# Patient Record
Sex: Female | Born: 1955 | Race: White | Hispanic: No | Marital: Married | Smoking: Never smoker
Health system: Southern US, Community
[De-identification: ages and names within clinical notes are randomized; demographics above are authoritative.]

## PROBLEM LIST (undated history)

## (undated) DIAGNOSIS — R251 Tremor, unspecified: Secondary | ICD-10-CM

## (undated) DIAGNOSIS — H5210 Myopia, unspecified eye: Secondary | ICD-10-CM

## (undated) DIAGNOSIS — G40909 Epilepsy, unspecified, not intractable, without status epilepticus: Secondary | ICD-10-CM

## (undated) DIAGNOSIS — E079 Disorder of thyroid, unspecified: Secondary | ICD-10-CM

## (undated) DIAGNOSIS — H524 Presbyopia: Secondary | ICD-10-CM

## (undated) DIAGNOSIS — R569 Unspecified convulsions: Secondary | ICD-10-CM

## (undated) HISTORY — DX: Disorder of thyroid, unspecified: E07.9

## (undated) HISTORY — PX: WISDOM TOOTH EXTRACTION: SHX21

## (undated) HISTORY — DX: Unspecified convulsions: R56.9

## (undated) HISTORY — DX: Myopia, unspecified eye: H52.10

## (undated) HISTORY — DX: Tremor, unspecified: R25.1

## (undated) HISTORY — DX: Epilepsy, unspecified, not intractable, without status epilepticus: G40.909

## (undated) HISTORY — PX: TONSILLECTOMY: SUR1361

## (undated) HISTORY — DX: Presbyopia: H52.4

---

## 1983-06-18 HISTORY — PX: LOBECTOMY: SHX5089

## 2007-09-12 ENCOUNTER — Emergency Department (HOSPITAL_BASED_OUTPATIENT_CLINIC_OR_DEPARTMENT_OTHER): Admission: EM | Admit: 2007-09-12 | Discharge: 2007-09-12 | Payer: Self-pay | Admitting: Emergency Medicine

## 2007-09-13 ENCOUNTER — Ambulatory Visit: Payer: Self-pay | Admitting: Vascular Surgery

## 2007-09-13 ENCOUNTER — Encounter (INDEPENDENT_AMBULATORY_CARE_PROVIDER_SITE_OTHER): Payer: Self-pay | Admitting: Emergency Medicine

## 2007-09-13 ENCOUNTER — Ambulatory Visit (HOSPITAL_COMMUNITY): Admission: RE | Admit: 2007-09-13 | Discharge: 2007-09-13 | Payer: Self-pay | Admitting: Emergency Medicine

## 2007-10-23 ENCOUNTER — Emergency Department (HOSPITAL_BASED_OUTPATIENT_CLINIC_OR_DEPARTMENT_OTHER): Admission: EM | Admit: 2007-10-23 | Discharge: 2007-10-23 | Payer: Self-pay | Admitting: Emergency Medicine

## 2011-06-27 ENCOUNTER — Ambulatory Visit (INDEPENDENT_AMBULATORY_CARE_PROVIDER_SITE_OTHER): Payer: BC Managed Care – HMO | Admitting: Obstetrics and Gynecology

## 2011-06-27 ENCOUNTER — Encounter: Payer: Self-pay | Admitting: Obstetrics and Gynecology

## 2011-06-27 VITALS — BP 104/70 | Temp 98.2°F | Ht 61.0 in | Wt 138.0 lb

## 2011-06-27 DIAGNOSIS — R251 Tremor, unspecified: Secondary | ICD-10-CM | POA: Insufficient documentation

## 2011-06-27 DIAGNOSIS — N951 Menopausal and female climacteric states: Secondary | ICD-10-CM

## 2011-06-27 DIAGNOSIS — R569 Unspecified convulsions: Secondary | ICD-10-CM | POA: Insufficient documentation

## 2011-06-27 DIAGNOSIS — E079 Disorder of thyroid, unspecified: Secondary | ICD-10-CM | POA: Insufficient documentation

## 2011-06-27 DIAGNOSIS — H5210 Myopia, unspecified eye: Secondary | ICD-10-CM | POA: Insufficient documentation

## 2011-06-27 NOTE — Progress Notes (Signed)
Irreg Periods: no Mood Swings: yes Hot Flashes: yes Vaginal Dryness: no Poor Sleeping: yes Urinary Urgency: no UTI Symptoms: no HRT: no   WAS ON BIEST 0.625 MG Fam Hx Osteo: no Prior Bone Scan: Yes 2 YRS.AGP  NL Osteoporosis: No Hx of Dvt: No

## 2011-06-27 NOTE — Progress Notes (Signed)
Regular Periods: nopm Mammogram: yes  2011  Monthly Breast Ex.: yes Exercise: yes  Tetanus < 10 years: yes Seatbelts: yes  NI. Bladder Functn.: yes Abuse at home: no  Daily BM's: yes Stressful Work: no  Healthy Diet: yes Sigmoid-Colonoscopy: no  Calcium: yes Medical problems this year: discuss hormones   LAST PAP:2011   Contraception:PM   Mammogram:  2011  PCP: NO  PMH: NO CHANGE  FMH: NO CHANGE  Last Bone Scan: 2 YEARS AGO  NL

## 2011-06-27 NOTE — Progress Notes (Signed)
56 YO postmenopausal female (menses stopped March 2012) previously on BHRT complains of insomnia, vasomotor symptoms and mood swings.Has a history of Hashimoto's Thyroiditis.  Stopped estradiol and oral progesterone July 2012 due to weight gain.  Exercises 4 times a week (swimming and high intensity cardio) and eats healthy and regularly.  Previously on Biest (70:30) 0.625 mg and  progesterone 100-200 mg  took these for about 5  months. Patient's primary concern is weight gain.  A: Menopausal Symptoms     Weight Gain     H/O Hashimoto's Thyroiditis  P: Reviewed blood vs saliva testing along with BHRT and     conventional hormone therapy stressing that one has not      been proven superior,  more effective or safer than the       other.      Patient to perform saliva testing prior to the prescription      of another hormone therapy regimen: estradiol,       progesterone, cortisol and DHEAS       Reviewed options for managing menopausal symptoms:     herbal, hormonal and miscellaneous      Thyroid Panel and testosterone-pending      RTO-TBA   Kimiyo Carmicheal, PA-C

## 2011-06-28 LAB — TESTOSTERONE, FREE, TOTAL, SHBG: Testosterone-% Free: 0.8 % (ref 0.4–2.4)

## 2011-06-28 LAB — THYROID PANEL WITH TSH
Free Thyroxine Index: 3.1 (ref 1.0–3.9)
T4, Total: 9.5 ug/dL (ref 5.0–12.5)
TSH: 1.23 u[IU]/mL (ref 0.350–4.500)

## 2011-07-15 ENCOUNTER — Telehealth: Payer: Self-pay | Admitting: Obstetrics and Gynecology

## 2011-07-15 NOTE — Telephone Encounter (Signed)
Call to patient to review hormone lab results and thyroid panel results.  Thyroid Panel is normal. Saliva hormone test showed low normal estradiol, low normal progesterone but an imbalance in the ratio between the two making her estrogen dominant (needs more progesterone).  Her DHEAS and Cortisol are within normal range.  Patient will be given for consideration topical progesterone cream only or a combination of estradiol with progesterone cream.   Left message with patient that I would be out of town and we could follow up upon my return.  Ernestina Joe, PA-C

## 2011-07-29 ENCOUNTER — Telehealth: Payer: Self-pay | Admitting: Obstetrics and Gynecology

## 2011-07-29 NOTE — Telephone Encounter (Signed)
Call to review patient's lab results.  Left message stating the same.  Roran Wegner, PA-C

## 2011-08-08 ENCOUNTER — Telehealth: Payer: Self-pay | Admitting: Obstetrics and Gynecology

## 2011-08-08 NOTE — Telephone Encounter (Signed)
Linda/ep pt. °

## 2011-08-08 NOTE — Telephone Encounter (Signed)
Pt called wanting test results.Please call pt with saliva test results.Jamie Carr

## 2011-08-08 NOTE — Telephone Encounter (Signed)
Jamie Carr

## 2011-08-09 ENCOUNTER — Telehealth: Payer: Self-pay | Admitting: Obstetrics and Gynecology

## 2011-08-09 NOTE — Telephone Encounter (Signed)
Call to patient, left recorded message that return call had been made. Messi Twedt, PA-C

## 2011-08-09 NOTE — Telephone Encounter (Signed)
Return call to patient, left message stating the same and that a copy of her labs will be sent to her.  Tyrrell Stephens, PA-C

## 2011-08-09 NOTE — Telephone Encounter (Signed)
Call to patient to follow up the results of labs.  Left a message that return call had been made.   Alejandrina Raimer, PA-C

## 2011-08-17 ENCOUNTER — Telehealth: Payer: Self-pay | Admitting: Obstetrics and Gynecology

## 2011-08-17 NOTE — Telephone Encounter (Signed)
Call to patient to review her hormone test results.  Estradiol is low normal, Progesterone is normal but ratio between the two is low (estrogen dominant).  Patient's adrenal glands also are showing signs of strain based on cortisol and DHEAS results though they are in low normal range.  Patient given the option of Progesterone Cream 15 mg daily or a combination low dose estradiol with the progesterone or nothing at all.  Patient chooses the topical progesterone cream (15 mg) and will start with the OTC Endocreme 1 mL daily (rotating sites).  She also had questions about "spot" weight loss.  Gave information on "cool sculpting"  patient to explore.  She will also consider reading the book "Adrenal Fatigue" to explore insight on dietary ways to support adrenals.  Urged self-care and renewal activities for adrenal support.  Jamie Fischbach, PA-C

## 2011-11-03 ENCOUNTER — Encounter: Payer: Self-pay | Admitting: Obstetrics and Gynecology

## 2011-11-03 ENCOUNTER — Ambulatory Visit (INDEPENDENT_AMBULATORY_CARE_PROVIDER_SITE_OTHER): Payer: BC Managed Care – HMO | Admitting: Obstetrics and Gynecology

## 2011-11-03 VITALS — BP 110/70 | Resp 16 | Ht 62.0 in | Wt 139.0 lb

## 2011-11-03 DIAGNOSIS — G252 Other specified forms of tremor: Secondary | ICD-10-CM

## 2011-11-03 DIAGNOSIS — G250 Essential tremor: Secondary | ICD-10-CM

## 2011-11-03 NOTE — Progress Notes (Signed)
56 YO with history of essential tremors since early 20's. Patient has been using Iodine 150 mcg daily (3 gtts),  and Tyrosine 50 mg bid with some noticeable

## 2013-09-04 ENCOUNTER — Emergency Department (HOSPITAL_BASED_OUTPATIENT_CLINIC_OR_DEPARTMENT_OTHER): Payer: BC Managed Care – PPO

## 2013-09-04 ENCOUNTER — Encounter (HOSPITAL_BASED_OUTPATIENT_CLINIC_OR_DEPARTMENT_OTHER): Payer: Self-pay | Admitting: Emergency Medicine

## 2013-09-04 ENCOUNTER — Emergency Department (HOSPITAL_BASED_OUTPATIENT_CLINIC_OR_DEPARTMENT_OTHER)
Admission: EM | Admit: 2013-09-04 | Discharge: 2013-09-04 | Disposition: A | Discharge status: Home / Self Care | Payer: BC Managed Care – PPO | Attending: Emergency Medicine | Admitting: Emergency Medicine

## 2013-09-04 DIAGNOSIS — Z8669 Personal history of other diseases of the nervous system and sense organs: Secondary | ICD-10-CM | POA: Diagnosis not present

## 2013-09-04 DIAGNOSIS — R220 Localized swelling, mass and lump, head: Secondary | ICD-10-CM | POA: Insufficient documentation

## 2013-09-04 DIAGNOSIS — Z79899 Other long term (current) drug therapy: Secondary | ICD-10-CM | POA: Diagnosis not present

## 2013-09-04 DIAGNOSIS — K118 Other diseases of salivary glands: Secondary | ICD-10-CM | POA: Insufficient documentation

## 2013-09-04 DIAGNOSIS — Z8639 Personal history of other endocrine, nutritional and metabolic disease: Secondary | ICD-10-CM | POA: Insufficient documentation

## 2013-09-04 DIAGNOSIS — R609 Edema, unspecified: Secondary | ICD-10-CM

## 2013-09-04 DIAGNOSIS — Z862 Personal history of diseases of the blood and blood-forming organs and certain disorders involving the immune mechanism: Secondary | ICD-10-CM | POA: Insufficient documentation

## 2013-09-04 DIAGNOSIS — R221 Localized swelling, mass and lump, neck: Secondary | ICD-10-CM

## 2013-09-04 LAB — CBC
HEMATOCRIT: 38.5 % (ref 36.0–46.0)
HEMOGLOBIN: 12.7 g/dL (ref 12.0–15.0)
MCH: 30.8 pg (ref 26.0–34.0)
MCHC: 33 g/dL (ref 30.0–36.0)
MCV: 93.2 fL (ref 78.0–100.0)
Platelets: 244 10*3/uL (ref 150–400)
RBC: 4.13 MIL/uL (ref 3.87–5.11)
RDW: 12.2 % (ref 11.5–15.5)
WBC: 9.5 10*3/uL (ref 4.0–10.5)

## 2013-09-04 LAB — BASIC METABOLIC PANEL
Anion gap: 17 — ABNORMAL HIGH (ref 5–15)
BUN: 15 mg/dL (ref 6–23)
CHLORIDE: 100 meq/L (ref 96–112)
CO2: 24 meq/L (ref 19–32)
Calcium: 10 mg/dL (ref 8.4–10.5)
Creatinine, Ser: 1.1 mg/dL (ref 0.50–1.10)
GFR calc Af Amer: 64 mL/min — ABNORMAL LOW (ref >90)
GFR calc non Af Amer: 55 mL/min — ABNORMAL LOW (ref >90)
GLUCOSE: 86 mg/dL (ref 70–99)
POTASSIUM: 3.8 meq/L (ref 3.7–5.3)
Sodium: 141 mEq/L (ref 137–147)

## 2013-09-04 MED ORDER — IOHEXOL 300 MG/ML  SOLN
75.0000 mL | Freq: Once | INTRAMUSCULAR | Status: AC | PRN
  Administered 2013-09-04: 75 mL via INTRAVENOUS

## 2013-09-04 MED ORDER — HYDROCODONE-ACETAMINOPHEN 5-325 MG PO TABS: 1.0000 | ORAL_TABLET | Freq: Four times a day (QID) | ORAL | Status: DC | PRN

## 2013-09-04 MED ORDER — ACETAMINOPHEN 325 MG PO TABS
650.0000 mg | ORAL_TABLET | Freq: Once | ORAL | Status: AC
  Administered 2013-09-04: 650 mg via ORAL
  Filled 2013-09-04: qty 2

## 2013-09-04 MED ORDER — CLINDAMYCIN HCL 300 MG PO CAPS: 300.0000 mg | ORAL_CAPSULE | Freq: Three times a day (TID) | ORAL | Status: DC

## 2013-09-04 MED ORDER — CLINDAMYCIN PHOSPHATE 600 MG/50ML IV SOLN
600.0000 mg | Freq: Once | INTRAVENOUS | Status: AC
  Administered 2013-09-04: 600 mg via INTRAVENOUS
  Filled 2013-09-04: qty 50

## 2013-09-04 MED ORDER — IBUPROFEN 800 MG PO TABS: 800.0000 mg | ORAL_TABLET | Freq: Three times a day (TID) | ORAL | Status: DC

## 2013-09-04 NOTE — ED Provider Notes (Signed)
CSN: [id number]     Arrival date & time 09/04/13  1232 History   First MD Initiated Contact with Patient 09/04/13 1244     No chief complaint on file.    (Consider location/radiation/quality/duration/timing/severity/associated sxs/prior Treatment) HPI Comments: Pt states that she woke up with jaw pain a couple of days ago and now she has swelling and pain to her left lower jaw. Denies fever. No history of similar symptoms. Denies airway problems or fever:states that she has seen a dentist in the last couple of months  The history is provided by the patient. No language interpreter was used.    Past Medical History  Diagnosis Date  . Tremors of nervous system     essential  . Thyroid disease   . Seizures   . Myopia   . Presbyopia   . Epilepsy    Past Surgical History  Procedure Laterality Date  . Cesarean section  1/93,1/95  . Tonsillectomy    . Wisdom tooth extraction    . Lobectomy  06/85    right temporal   Family History  Problem Relation Age of Onset  . Diabetes Paternal Grandmother   . Nephrolithiasis Father   . Migraines Mother   . Hypertension Mother   . Deep vein thrombosis Mother   . Stroke Mother   . Nephrolithiasis Mother    History  Substance Use Topics  . Smoking status: Never Smoker   . Smokeless tobacco: Never Used  . Alcohol Use: No   OB History   Grav Para Term Preterm Abortions TAB SAB Ect Mult Living   3 2   1  1   2      Review of Systems  Constitutional: Negative.   Respiratory: Negative.   Cardiovascular: Negative.       Allergies  Eggs or egg-derived products; Sulfa antibiotics; and Wheat bran  Home Medications   Prior to Admission medications   Medication Sig Start Date End Date Taking? Authorizing Provider  Ascorbic Acid (VITAMIN C) 100 MG tablet Take 100 mg by mouth daily.    Historical Provider, MD  cholecalciferol (VITAMIN D) 1000 UNITS tablet Take 10,000 Units by mouth daily.    Historical Provider, MD  co-enzyme Q-10 30  MG capsule Take 30 mg by mouth 3 (three) times daily.    Historical Provider, MD  DIGESTIVE ENZYMES PO Take by mouth.    Historical Provider, MD  L-TYROSINE PO Take 50 mg by mouth.    Historical Provider, MD  Multiple Minerals-Vitamins (CALCIUM & VIT D3 BONE HEALTH PO) Take by mouth.    Historical Provider, MD  thiamine (VITAMIN B-1) 100 MG tablet Take 100 mg by mouth daily.    Historical Provider, MD   BP 168/82  Pulse 82  Resp 16  Ht 5\' 2"  (1.575 m)  Wt 130 lb (58.968 kg)  BMI 23.77 kg/m2  SpO2 100% Physical Exam  Nursing note and vitals reviewed. Constitutional: She is oriented to person, place, and time. She appears well-developed and well-nourished.  HENT:  Right Ear: External ear normal.  Left Ear: External ear normal.  Obvious swelling to the left lower jaw that goes under the chin  Cardiovascular: Normal rate and regular rhythm.   Pulmonary/Chest: Effort normal.  Neurological: She is alert and oriented to person, place, and time.    ED Course  Procedures (including critical care time) Labs Review Labs Reviewed  BASIC METABOLIC PANEL - Abnormal; Notable for the following:    GFR calc non Af [place] Dago[place]  55 (*)    GFR calc Af Amer 64 (*)    Anion gap 17 (*)    All other components within normal limits  CBC    Imaging Review Ct Maxillofacial W/cm  09/04/2013   ADDENDUM REPORT: 09/04/2013 14:16  CLINICAL DATA:  Left facial swelling  EXAM: CT MAXILLOFACIAL WITH CONTRAST  TECHNIQUE: Multidetector CT imaging of the maxillofacial structures was performed with intravenous contrast. Multiplanar CT image reconstructions were also generated. A small metallic BB was placed on the right temple in order to reliably differentiate right from left.  CONTRAST:  100 mL Omnipaque 300 IV  COMPARISON:  None.  FINDINGS: Soft tissue edema is present lateral and inferior to the mandible on the left side. Mild thickening of the platysmas muscle with subcutaneous edema. Enlargement with hyper enhancement  of the left sublingual gland which measures approximately 15 x 20 mm. Soft tissue edema is also located posterior to the left sublingual gland. Small hyper enhancing lymph nodes are present inferior to the mandible on the left measuring 6 mm and 8 mm.  Negative for salivary duct stone.  Submandibular gland is normal bilaterally. No bony change. No dental abscess identified. No soft tissue abscess identified.  The pharynx is normal and symmetric.  The orbit is normal.  Paranasal sinuses are clear.  IMPRESSION: Hyper enhancing enlarged left sublingual gland with adjacent hyper enhancing lymph nodes. There is also adjacent subcutaneous edema. Findings are most likely due to infection however sublingual tumor not excluded. Close followup is recommended after antibiotic to ensure clearing.   Electronically Signed   By: Marlan Palau M.D.   On: 09/04/2013 14:16   09/04/2013   EXAM: CT MAXILLOFACIAL WITH CONTRAST  TECHNIQUE: Multidetector CT imaging of the maxillofacial structures was performed with intravenous contrast. Multiplanar CT image reconstructions were also generated. A small metallic BB was placed on the right temple in order to reliably differentiate right from left.  CONTRAST:  75mL OMNIPAQUE IOHEXOL 300 MG/ML  SOLN  COMPARISON:  None.  Electronically Signed: By: Marlan Palau M.D. On: 09/04/2013 13:32     EKG Interpretation None      MDM   Final diagnoses:  Sublingual gland swelling    Discussed findings and follow up with pt. Pt is okay with follow up for continued symptoms. No airway involvement    Teressa Lower, NP 09/04/13 [phone]

## 2013-09-04 NOTE — ED Notes (Signed)
Pt c/o bil jaw , left ear pain, and teeth pain x 3 days

## 2013-09-04 NOTE — Discharge Instructions (Signed)
As discussed if symptom worsen or don't get any better then you need to follow up with your doctor Dental Care and Dentist Visits Dental care supports good overall health. Regular dental visits can also help you avoid dental pain, bleeding, infection, and other more serious health problems in the future. It is important to keep the mouth healthy because diseases in the teeth, gums, and other oral tissues can spread to other areas of the body. Some problems, such as diabetes, heart disease, and pre-term labor have been associated with poor oral health.  See your dentist every 6 months. If you experience emergency problems such as a toothache or broken tooth, go to the dentist right away. If you see your dentist regularly, you may catch problems early. It is easier to be treated for problems in the early stages.  WHAT TO EXPECT AT A DENTIST VISIT  Your dentist will look for many common oral health problems and recommend proper treatment. At your regular dental visit, you can expect:  Gentle cleaning of the teeth and gums. This includes scraping and polishing. This helps to remove the sticky substance around the teeth and gums (plaque). Plaque forms in the mouth shortly after eating. Over time, plaque hardens on the teeth as tartar. If tartar is not removed regularly, it can cause problems. Cleaning also helps remove stains.  Periodic X-rays. These pictures of the teeth and supporting bone will help your dentist assess the health of your teeth.  Periodic fluoride treatments. Fluoride is a natural mineral shown to help strengthen teeth. Fluoride treatmentinvolves applying a fluoride gel or varnish to the teeth. It is most commonly done in children.  Examination of the mouth, tongue, jaws, teeth, and gums to look for any oral health problems, such as:  Cavities (dental caries). This is decay on the tooth caused by plaque, sugar, and acid in the mouth. It is best to catch a cavity when it is  small.  Inflammation of the gums caused by plaque buildup (gingivitis).  Problems with the mouth or malformed or misaligned teeth.  Oral cancer or other diseases of the soft tissues or jaws. KEEP YOUR TEETH AND GUMS HEALTHY For healthy teeth and gums, follow these general guidelines as well as your dentist's specific advice:  Have your teeth professionally cleaned at the dentist every 6 months.  Brush twice daily with a fluoride toothpaste.  Floss your teeth daily.  Ask your dentist if you need fluoride supplements, treatments, or fluoride toothpaste.  Eat a healthy diet. Reduce foods and drinks with added sugar.  Avoid smoking. TREATMENT FOR ORAL HEALTH PROBLEMS If you have oral health problems, treatment varies depending on the conditions present in your teeth and gums.  Your caregiver will most likely recommend good oral hygiene at each visit.  For cavities, gingivitis, or other oral health disease, your caregiver will perform a procedure to treat the problem. This is typically done at a separate appointment. Sometimes your caregiver will refer you to another dental specialist for specific tooth problems or for surgery. SEEK IMMEDIATE DENTAL CARE IF:  You have pain, bleeding, or soreness in the gum, tooth, jaw, or mouth area.  A permanent tooth becomes loose or separated from the gum socket.  You experience a blow or injury to the mouth or jaw area. Document Released: 09/15/2010 Document Revised: 03/28/2011 Document Reviewed: 09/15/2010 [facility]ExitCare Patient Information 2015 [place] [place]. This information is not intended to replace advice given to you by your health care provider. Make sure you  discuss any questions you have with your health care provider. ° °

## 2013-09-08 NOTE — ED Provider Notes (Signed)
Medical screening examination/treatment/procedure(s) were performed by non-physician practitioner and as supervising physician I was immediately available for consultation/collaboration.   Meagen Limones L Rhian Asebedo, MD 09/08/13 1122 

## 2013-11-18 ENCOUNTER — Encounter (HOSPITAL_BASED_OUTPATIENT_CLINIC_OR_DEPARTMENT_OTHER): Payer: Self-pay | Admitting: Emergency Medicine

## 2018-12-20 DIAGNOSIS — Z0001 Encounter for general adult medical examination with abnormal findings: Secondary | ICD-10-CM | POA: Diagnosis not present

## 2018-12-20 DIAGNOSIS — N951 Menopausal and female climacteric states: Secondary | ICD-10-CM | POA: Diagnosis not present

## 2018-12-20 DIAGNOSIS — Z13220 Encounter for screening for lipoid disorders: Secondary | ICD-10-CM | POA: Diagnosis not present

## 2018-12-20 DIAGNOSIS — R251 Tremor, unspecified: Secondary | ICD-10-CM | POA: Diagnosis not present

## 2018-12-20 DIAGNOSIS — E063 Autoimmune thyroiditis: Secondary | ICD-10-CM | POA: Diagnosis not present

## 2018-12-20 DIAGNOSIS — E039 Hypothyroidism, unspecified: Secondary | ICD-10-CM | POA: Diagnosis not present

## 2018-12-20 DIAGNOSIS — E559 Vitamin D deficiency, unspecified: Secondary | ICD-10-CM | POA: Diagnosis not present

## 2018-12-20 DIAGNOSIS — E538 Deficiency of other specified B group vitamins: Secondary | ICD-10-CM | POA: Diagnosis not present

## 2018-12-20 DIAGNOSIS — K9041 Non-celiac gluten sensitivity: Secondary | ICD-10-CM | POA: Diagnosis not present

## 2019-07-16 DIAGNOSIS — G629 Polyneuropathy, unspecified: Secondary | ICD-10-CM | POA: Diagnosis not present

## 2020-03-12 DIAGNOSIS — E559 Vitamin D deficiency, unspecified: Secondary | ICD-10-CM | POA: Diagnosis not present

## 2020-03-12 DIAGNOSIS — Z789 Other specified health status: Secondary | ICD-10-CM | POA: Diagnosis not present

## 2020-03-12 DIAGNOSIS — N951 Menopausal and female climacteric states: Secondary | ICD-10-CM | POA: Diagnosis not present

## 2020-03-12 DIAGNOSIS — E063 Autoimmune thyroiditis: Secondary | ICD-10-CM | POA: Diagnosis not present

## 2020-03-12 DIAGNOSIS — Z0001 Encounter for general adult medical examination with abnormal findings: Secondary | ICD-10-CM | POA: Diagnosis not present

## 2020-03-12 DIAGNOSIS — E039 Hypothyroidism, unspecified: Secondary | ICD-10-CM | POA: Diagnosis not present

## 2020-03-12 DIAGNOSIS — E538 Deficiency of other specified B group vitamins: Secondary | ICD-10-CM | POA: Diagnosis not present

## 2020-06-30 ENCOUNTER — Inpatient Hospital Stay (HOSPITAL_BASED_OUTPATIENT_CLINIC_OR_DEPARTMENT_OTHER)
Admission: EM | Admit: 2020-06-30 | Discharge: 2020-07-15 | DRG: 177 | Disposition: A | Discharge status: Home Health | Payer: BC Managed Care – PPO | Attending: Internal Medicine | Admitting: Internal Medicine

## 2020-06-30 ENCOUNTER — Emergency Department (HOSPITAL_BASED_OUTPATIENT_CLINIC_OR_DEPARTMENT_OTHER): Payer: BC Managed Care – PPO

## 2020-06-30 ENCOUNTER — Encounter (HOSPITAL_BASED_OUTPATIENT_CLINIC_OR_DEPARTMENT_OTHER): Payer: Self-pay | Admitting: *Deleted

## 2020-06-30 ENCOUNTER — Other Ambulatory Visit: Payer: Self-pay

## 2020-06-30 DIAGNOSIS — E440 Moderate protein-calorie malnutrition: Secondary | ICD-10-CM | POA: Diagnosis not present

## 2020-06-30 DIAGNOSIS — A491 Streptococcal infection, unspecified site: Secondary | ICD-10-CM | POA: Diagnosis not present

## 2020-06-30 DIAGNOSIS — J80 Acute respiratory distress syndrome: Secondary | ICD-10-CM | POA: Diagnosis not present

## 2020-06-30 DIAGNOSIS — R091 Pleurisy: Secondary | ICD-10-CM | POA: Diagnosis not present

## 2020-06-30 DIAGNOSIS — Z8249 Family history of ischemic heart disease and other diseases of the circulatory system: Secondary | ICD-10-CM | POA: Diagnosis not present

## 2020-06-30 DIAGNOSIS — R54 Age-related physical debility: Secondary | ICD-10-CM | POA: Diagnosis not present

## 2020-06-30 DIAGNOSIS — E876 Hypokalemia: Secondary | ICD-10-CM

## 2020-06-30 DIAGNOSIS — R945 Abnormal results of liver function studies: Secondary | ICD-10-CM | POA: Diagnosis not present

## 2020-06-30 DIAGNOSIS — J9690 Respiratory failure, unspecified, unspecified whether with hypoxia or hypercapnia: Secondary | ICD-10-CM

## 2020-06-30 DIAGNOSIS — R7401 Elevation of levels of liver transaminase levels: Secondary | ICD-10-CM | POA: Diagnosis present

## 2020-06-30 DIAGNOSIS — Z6822 Body mass index (BMI) 22.0-22.9, adult: Secondary | ICD-10-CM

## 2020-06-30 DIAGNOSIS — Z882 Allergy status to sulfonamides status: Secondary | ICD-10-CM | POA: Diagnosis not present

## 2020-06-30 DIAGNOSIS — Z20822 Contact with and (suspected) exposure to covid-19: Secondary | ICD-10-CM | POA: Diagnosis not present

## 2020-06-30 DIAGNOSIS — R0602 Shortness of breath: Secondary | ICD-10-CM

## 2020-06-30 DIAGNOSIS — I700 Atherosclerosis of aorta: Secondary | ICD-10-CM | POA: Diagnosis not present

## 2020-06-30 DIAGNOSIS — Z833 Family history of diabetes mellitus: Secondary | ICD-10-CM | POA: Diagnosis not present

## 2020-06-30 DIAGNOSIS — B954 Other streptococcus as the cause of diseases classified elsewhere: Secondary | ICD-10-CM | POA: Diagnosis present

## 2020-06-30 DIAGNOSIS — J811 Chronic pulmonary edema: Secondary | ICD-10-CM | POA: Diagnosis present

## 2020-06-30 DIAGNOSIS — Z91012 Allergy to eggs: Secondary | ICD-10-CM

## 2020-06-30 DIAGNOSIS — J852 Abscess of lung without pneumonia: Secondary | ICD-10-CM | POA: Diagnosis not present

## 2020-06-30 DIAGNOSIS — J929 Pleural plaque without asbestos: Secondary | ICD-10-CM | POA: Diagnosis not present

## 2020-06-30 DIAGNOSIS — J9601 Acute respiratory failure with hypoxia: Secondary | ICD-10-CM | POA: Diagnosis not present

## 2020-06-30 DIAGNOSIS — Z4682 Encounter for fitting and adjustment of non-vascular catheter: Secondary | ICD-10-CM | POA: Diagnosis not present

## 2020-06-30 DIAGNOSIS — J982 Interstitial emphysema: Secondary | ICD-10-CM | POA: Diagnosis not present

## 2020-06-30 DIAGNOSIS — R918 Other nonspecific abnormal finding of lung field: Secondary | ICD-10-CM | POA: Diagnosis present

## 2020-06-30 DIAGNOSIS — J189 Pneumonia, unspecified organism: Secondary | ICD-10-CM | POA: Diagnosis present

## 2020-06-30 DIAGNOSIS — J948 Other specified pleural conditions: Secondary | ICD-10-CM | POA: Diagnosis not present

## 2020-06-30 DIAGNOSIS — R7989 Other specified abnormal findings of blood chemistry: Secondary | ICD-10-CM

## 2020-06-30 DIAGNOSIS — G40909 Epilepsy, unspecified, not intractable, without status epilepticus: Secondary | ICD-10-CM | POA: Diagnosis present

## 2020-06-30 DIAGNOSIS — I959 Hypotension, unspecified: Secondary | ICD-10-CM | POA: Diagnosis present

## 2020-06-30 DIAGNOSIS — J939 Pneumothorax, unspecified: Secondary | ICD-10-CM | POA: Diagnosis not present

## 2020-06-30 DIAGNOSIS — E872 Acidosis: Secondary | ICD-10-CM | POA: Diagnosis present

## 2020-06-30 DIAGNOSIS — J90 Pleural effusion, not elsewhere classified: Secondary | ICD-10-CM | POA: Diagnosis not present

## 2020-06-30 DIAGNOSIS — D75839 Thrombocytosis, unspecified: Secondary | ICD-10-CM | POA: Diagnosis present

## 2020-06-30 DIAGNOSIS — Z823 Family history of stroke: Secondary | ICD-10-CM | POA: Diagnosis not present

## 2020-06-30 DIAGNOSIS — J942 Hemothorax: Secondary | ICD-10-CM | POA: Diagnosis not present

## 2020-06-30 DIAGNOSIS — J869 Pyothorax without fistula: Secondary | ICD-10-CM | POA: Diagnosis not present

## 2020-06-30 DIAGNOSIS — J9811 Atelectasis: Secondary | ICD-10-CM | POA: Diagnosis not present

## 2020-06-30 DIAGNOSIS — Z79899 Other long term (current) drug therapy: Secondary | ICD-10-CM

## 2020-06-30 DIAGNOSIS — R0609 Other forms of dyspnea: Secondary | ICD-10-CM | POA: Diagnosis not present

## 2020-06-30 DIAGNOSIS — E871 Hypo-osmolality and hyponatremia: Secondary | ICD-10-CM | POA: Diagnosis not present

## 2020-06-30 DIAGNOSIS — D649 Anemia, unspecified: Secondary | ICD-10-CM

## 2020-06-30 LAB — BASIC METABOLIC PANEL
Anion gap: 13 (ref 5–15)
BUN: 36 mg/dL — ABNORMAL HIGH (ref 8–23)
CO2: 20 mmol/L — ABNORMAL LOW (ref 22–32)
Calcium: 10.7 mg/dL — ABNORMAL HIGH (ref 8.9–10.3)
Chloride: 99 mmol/L (ref 98–111)
Creatinine, Ser: 0.88 mg/dL (ref 0.44–1.00)
GFR, Estimated: >60 mL/min (ref >60)
Glucose, Bld: 118 mg/dL — ABNORMAL HIGH (ref 70–99)
Potassium: 3.7 mmol/L (ref 3.5–5.1)
Sodium: 132 mmol/L — ABNORMAL LOW (ref 135–145)

## 2020-06-30 LAB — RESP PANEL BY RT-PCR (FLU A&B, COVID) ARPGX2
Influenza A by PCR: NEGATIVE
Influenza B by PCR: NEGATIVE
SARS Coronavirus 2 by RT PCR: NEGATIVE

## 2020-06-30 LAB — CBC WITH DIFFERENTIAL/PLATELET
Abs Immature Granulocytes: 1.07 10*3/uL — ABNORMAL HIGH (ref 0.00–0.07)
Basophils Absolute: 0.2 10*3/uL — ABNORMAL HIGH (ref 0.0–0.1)
Basophils Relative: 1 %
Eosinophils Absolute: 0 10*3/uL (ref 0.0–0.5)
Eosinophils Relative: 0 %
HCT: 30.6 % — ABNORMAL LOW (ref 36.0–46.0)
Hemoglobin: 10.5 g/dL — ABNORMAL LOW (ref 12.0–15.0)
Immature Granulocytes: 4 %
Lymphocytes Relative: 5 %
Lymphs Abs: 1.5 10*3/uL (ref 0.7–4.0)
MCH: 31.3 pg (ref 26.0–34.0)
MCHC: 34.3 g/dL (ref 30.0–36.0)
MCV: 91.1 fL (ref 80.0–100.0)
Monocytes Absolute: 1.8 10*3/uL — ABNORMAL HIGH (ref 0.1–1.0)
Monocytes Relative: 6 %
Neutro Abs: 24.7 10*3/uL — ABNORMAL HIGH (ref 1.7–7.7)
Neutrophils Relative %: 84 %
Platelets: 635 10*3/uL — ABNORMAL HIGH (ref 150–400)
RBC: 3.36 MIL/uL — ABNORMAL LOW (ref 3.87–5.11)
RDW: 13.7 % (ref 11.5–15.5)
WBC: 29.3 10*3/uL — ABNORMAL HIGH (ref 4.0–10.5)
nRBC: 0 % (ref 0.0–0.2)

## 2020-06-30 LAB — CBC
HCT: 31.6 % — ABNORMAL LOW (ref 36.0–46.0)
Hemoglobin: 10.6 g/dL — ABNORMAL LOW (ref 12.0–15.0)
MCH: 31.3 pg (ref 26.0–34.0)
MCHC: 33.5 g/dL (ref 30.0–36.0)
MCV: 93.2 fL (ref 80.0–100.0)
Platelets: 620 10*3/uL — ABNORMAL HIGH (ref 150–400)
RBC: 3.39 MIL/uL — ABNORMAL LOW (ref 3.87–5.11)
RDW: 13.9 % (ref 11.5–15.5)
WBC: 23 10*3/uL — ABNORMAL HIGH (ref 4.0–10.5)
nRBC: 0 % (ref 0.0–0.2)

## 2020-06-30 LAB — PROTEIN, PLEURAL OR PERITONEAL FLUID: Total protein, fluid: <3 g/dL

## 2020-06-30 LAB — BODY FLUID CELL COUNT WITH DIFFERENTIAL
Other Cells, Fluid: UNDETERMINED %
Total Nucleated Cell Count, Fluid: 16000 cu mm — ABNORMAL HIGH (ref 0–1000)

## 2020-06-30 LAB — TROPONIN I (HIGH SENSITIVITY)
Troponin I (High Sensitivity): 9 ng/L (ref <18)
Troponin I (High Sensitivity): 11 ng/L (ref <18)

## 2020-06-30 LAB — CREATININE, SERUM
Creatinine, Ser: 0.9 mg/dL (ref 0.44–1.00)
GFR, Estimated: >60 mL/min (ref >60)

## 2020-06-30 LAB — LACTATE DEHYDROGENASE: LDH: 152 U/L (ref 98–192)

## 2020-06-30 LAB — LACTIC ACID, PLASMA
Lactic Acid, Venous: 1.2 mmol/L (ref 0.5–1.9)
Lactic Acid, Venous: 1.3 mmol/L (ref 0.5–1.9)

## 2020-06-30 LAB — MRSA PCR SCREENING: MRSA by PCR: NEGATIVE

## 2020-06-30 LAB — LACTATE DEHYDROGENASE, PLEURAL OR PERITONEAL FLUID: LD, Fluid: 534 U/L — ABNORMAL HIGH (ref 3–23)

## 2020-06-30 MED ORDER — LORATADINE 10 MG PO TABS
10.0000 mg | ORAL_TABLET | Freq: Every day | ORAL | Status: DC
  Administered 2020-06-30 – 2020-07-14 (×15): 10 mg via ORAL
  Filled 2020-06-30 (×15): qty 1

## 2020-06-30 MED ORDER — VANCOMYCIN HCL IN DEXTROSE 1-5 GM/200ML-% IV SOLN
1000.0000 mg | Freq: Once | INTRAVENOUS | Status: AC
  Administered 2020-06-30: 1000 mg via INTRAVENOUS
  Filled 2020-06-30: qty 200

## 2020-06-30 MED ORDER — ONDANSETRON HCL 4 MG/2ML IJ SOLN: 4.0000 mg | Freq: Four times a day (QID) | INTRAMUSCULAR | Status: DC | PRN

## 2020-06-30 MED ORDER — SODIUM CHLORIDE 0.9 % IV SOLN
1.0000 g | INTRAVENOUS | Status: DC
  Filled 2020-06-30: qty 10

## 2020-06-30 MED ORDER — LACTATED RINGERS IV BOLUS (SEPSIS): 500.0000 mL | Freq: Once | INTRAVENOUS | Status: DC

## 2020-06-30 MED ORDER — SODIUM CHLORIDE 0.9 % IV SOLN
INTRAVENOUS | Status: DC | PRN
  Administered 2020-06-30: 250 mL via INTRAVENOUS

## 2020-06-30 MED ORDER — SODIUM CHLORIDE 0.9% FLUSH
10.0000 mL | Freq: Three times a day (TID) | INTRAVENOUS | Status: DC
  Administered 2020-06-30 – 2020-07-01 (×3): 10 mL
  Filled 2020-06-30: qty 10

## 2020-06-30 MED ORDER — LACTATED RINGERS IV SOLN: INTRAVENOUS | Status: DC

## 2020-06-30 MED ORDER — LEVOCETIRIZINE DIHYDROCHLORIDE 5 MG PO TABS: 5.0000 mg | ORAL_TABLET | Freq: Every evening | ORAL | Status: DC

## 2020-06-30 MED ORDER — LIDOCAINE-EPINEPHRINE (PF) 2 %-1:200000 IJ SOLN
INTRAMUSCULAR | Status: AC
  Filled 2020-06-30: qty 20

## 2020-06-30 MED ORDER — IBUPROFEN 200 MG PO TABS
600.0000 mg | ORAL_TABLET | Freq: Once | ORAL | Status: AC
  Administered 2020-06-30: 600 mg via ORAL
  Filled 2020-06-30: qty 3

## 2020-06-30 MED ORDER — CHLORHEXIDINE GLUCONATE 0.12 % MT SOLN
15.0000 mL | Freq: Two times a day (BID) | OROMUCOSAL | Status: DC
  Administered 2020-06-30 – 2020-07-15 (×25): 15 mL via OROMUCOSAL
  Filled 2020-06-30 (×26): qty 15

## 2020-06-30 MED ORDER — SODIUM CHLORIDE 0.9 % IV SOLN
500.0000 mg | INTRAVENOUS | Status: DC
  Administered 2020-07-01 – 2020-07-02 (×2): 500 mg via INTRAVENOUS
  Filled 2020-06-30 (×2): qty 500

## 2020-06-30 MED ORDER — VITAMIN C 250 MG PO TABS
250.0000 mg | ORAL_TABLET | Freq: Every day | ORAL | Status: DC
  Administered 2020-06-30 – 2020-07-15 (×16): 250 mg via ORAL
  Filled 2020-06-30 (×16): qty 1

## 2020-06-30 MED ORDER — ONDANSETRON HCL 4 MG PO TABS: 4.0000 mg | ORAL_TABLET | Freq: Four times a day (QID) | ORAL | Status: DC | PRN

## 2020-06-30 MED ORDER — SODIUM CHLORIDE 0.9 % IV SOLN
500.0000 mg | Freq: Once | INTRAVENOUS | Status: AC
  Administered 2020-06-30: 500 mg via INTRAVENOUS
  Filled 2020-06-30: qty 500

## 2020-06-30 MED ORDER — ACETAMINOPHEN 325 MG PO TABS
650.0000 mg | ORAL_TABLET | Freq: Four times a day (QID) | ORAL | Status: DC | PRN
  Administered 2020-07-01 – 2020-07-15 (×21): 650 mg via ORAL
  Filled 2020-06-30 (×20): qty 2

## 2020-06-30 MED ORDER — LACTATED RINGERS IV BOLUS (SEPSIS): 250.0000 mL | Freq: Once | INTRAVENOUS | Status: DC

## 2020-06-30 MED ORDER — LACTATED RINGERS IV BOLUS (SEPSIS)
1000.0000 mL | Freq: Once | INTRAVENOUS | Status: AC
  Administered 2020-06-30: 1000 mL via INTRAVENOUS

## 2020-06-30 MED ORDER — ORAL CARE MOUTH RINSE
15.0000 mL | Freq: Two times a day (BID) | OROMUCOSAL | Status: DC
  Administered 2020-06-30 – 2020-07-15 (×17): 15 mL via OROMUCOSAL

## 2020-06-30 MED ORDER — SODIUM CHLORIDE 0.9 % IV SOLN
INTRAVENOUS | Status: DC | PRN
  Administered 2020-06-30: 250 mL via INTRAVENOUS
  Administered 2020-06-30: 10 mL via INTRAVENOUS

## 2020-06-30 MED ORDER — SODIUM CHLORIDE 0.9 % IV SOLN
2.0000 g | Freq: Once | INTRAVENOUS | Status: AC
  Administered 2020-06-30: 2 g via INTRAVENOUS
  Filled 2020-06-30: qty 20

## 2020-06-30 MED ORDER — CHLORHEXIDINE GLUCONATE CLOTH 2 % EX PADS
6.0000 | MEDICATED_PAD | Freq: Every day | CUTANEOUS | Status: DC
  Administered 2020-06-30 – 2020-07-15 (×16): 6 via TOPICAL

## 2020-06-30 MED ORDER — SODIUM CHLORIDE 0.9 % IV SOLN
INTRAVENOUS | Status: DC | PRN
  Administered 2020-07-08 – 2020-07-09 (×2): 250 mL via INTRAVENOUS

## 2020-06-30 MED ORDER — FLUTICASONE PROPIONATE 50 MCG/ACT NA SUSP: 1.0000 | Freq: Every day | NASAL | Status: DC | PRN

## 2020-06-30 MED ORDER — ENOXAPARIN SODIUM 40 MG/0.4ML IJ SOSY
40.0000 mg | PREFILLED_SYRINGE | INTRAMUSCULAR | Status: DC
  Administered 2020-06-30 – 2020-07-14 (×15): 40 mg via SUBCUTANEOUS
  Filled 2020-06-30 (×15): qty 0.4

## 2020-06-30 MED ORDER — ACETAMINOPHEN 650 MG RE SUPP: 650.0000 mg | Freq: Four times a day (QID) | RECTAL | Status: DC | PRN

## 2020-06-30 NOTE — ED Notes (Signed)
Fluids removed and Tube placed in

## 2020-06-30 NOTE — ED Notes (Signed)
Procedure complete. Those present Radene Knee, RN Dr. Rosalia Hammers

## 2020-06-30 NOTE — Consult Note (Signed)
NAME:  Jamie Carr, MRN:  [id number], DOB:  1955-02-21, LOS: 0 ADMISSION DATE:  06/30/2020, CONSULTATION DATE:  6/14 REFERRING MD:  Dr. Wilkie Aye EDP, CHIEF COMPLAINT:  SOB, Effusion   History of Present Illness:  65 year old female with PMH as below, which is significant for epilepsy and tremors. She presented to [facility] ED with complaints of dyspnea and left sided chest pain for a few weeks. Pain was worse with deep breathing and coughing. L shoulder pain as well. Dyspnea was progressive prompting her to seek medical attention on 6/14. In the ED she was tachypneic, but no hypoxia was observed. CXR showed complete opacification of the left hemithorax suggestive of large pleural effusion. CT done to further characterize showed large left sided pleural effusion as well as concern for tumor at the left hilum and a pleural based mass. PCCM asked to consult for effusion management.   Pertinent  Medical History   has a past medical history of Epilepsy (HCC), Myopia, Presbyopia, Thyroid disease, and Tremors of nervous system.   Significant Hospital Events: Including procedures, antibiotic start and stop dates in addition to other pertinent events   6/14 Admit to [facility], txfr to [facility] and PCCM consulted  Interim History / Subjective:  Hemodynamically stable post-chest tube placement on Nasal cannula oxygen   Objective   Blood pressure (!) 90/59, pulse 87, temperature 98.4 F (36.9 C), temperature source Oral, resp. rate (!) 26, height 5\' 2"  (1.575 m), weight 55.3 kg, SpO2 100 %.       No intake or output data in the 24 hours ending 06/30/20 0856 Filed Weights   06/30/20 0625  Weight: 55.3 kg    General:  thin F, awake and uncomfortable-appearing, but not in severe distress HEENT: MM pink/moist Neuro: awake, oriented x3, moving all extremeties CV: s1s2 rrr, no m/r/g PULM:  L-sided chest tube in place without bleeding, decreased air movement LLL, no significant rhonchi or rales, mild  tachypnea without accessory muscle use GI: soft, bsx4 active  Extremities: warm/dry, no edema  Skin: no rashes or lesions   Labs/imaging that I havepersonally reviewed  (right click and "Reselect all SmartList Selections" daily)  CXR: opacification of the L hemithorax CT: massive left sided pleural effusion as well as concern for tumor at the left hilum and a left sided pleural based mass.   Na 132, CO2 20, Creat 0.88, Calcium 10.7 WBC 29.3, Hemoglobin 10.5, Platelets 635  Resolved Hospital Problem list     Assessment & Plan:   Large left sided pleural effusion: based on CT findings there is a high suspicion for malignant effusion. - Place small bore chest tube - Send pleural fluid for LDH, protein, cell, count, gram stain, culture, cytology,   -Continue empiric antibiotics with Azithromycin, Ceftriaxone and vancomycin  -continue nasal cannula oxygen to maintain O2 sats >92%, currently on 2L  Left hilar mass Left pleural mass - Ideally will get diagnosis from pleural fluid - Repeat CT once effusion is drained to better characterize left chest - If no diagnosis from pleural fluid, may need bronchoscopy for biopsy depending on repeat CT.   Leukocytosis - Infectious vs malignancy - Workup per primary -blood cultures pending   Best practice (right click and "Reselect all SmartList Selections" daily)  Diet:  Oral Pain/Anxiety/Delirium protocol (if indicated): No VAP protocol (if indicated): Not indicated DVT prophylaxis: LMWH GI prophylaxis: N/A Glucose control:  SSI No Central venous access:  N/A Arterial line:  N/A Foley:  N/A Mobility:  OOB  PT consulted: N/A Last date of multidisciplinary goals of care discussion [ ]  Code Status:  full code Disposition: Admit to hospitalists  Labs   CBC: Recent Labs  Lab 06/30/20 0625  WBC 29.3*  NEUTROABS 24.7*  HGB 10.5*  HCT 30.6*  MCV 91.1  PLT 635*    Basic Metabolic Panel: Recent Labs  Lab 06/30/20 0625  NA  132*  K 3.7  CL 99  CO2 20*  GLUCOSE 118*  BUN 36*  CREATININE 0.88  CALCIUM 10.7*   GFR: Estimated Creatinine Clearance: 51.8 mL/min (by C-G formula based on SCr of 0.88 mg/dL). Recent Labs  Lab 06/30/20 0625  WBC 29.3*    Liver Function Tests: No results for input(s): AST, ALT, ALKPHOS, BILITOT, PROT, ALBUMIN in the last 168 hours. No results for input(s): LIPASE, AMYLASE in the last 168 hours. No results for input(s): AMMONIA in the last 168 hours.  ABG No results found for: PHART, PCO2ART, PO2ART, HCO3, TCO2, ACIDBASEDEF, O2SAT   Coagulation Profile: No results for input(s): INR, PROTIME in the last 168 hours.  Cardiac Enzymes: No results for input(s): CKTOTAL, CKMB, CKMBINDEX, TROPONINI in the last 168 hours.  HbA1C: No results found for: HGBA1C  CBG: No results for input(s): GLUCAP in the last 168 hours.  Review of Systems:   Review of Systems  Constitutional:  Positive for chills, malaise/fatigue and weight loss.  Respiratory:  Positive for cough and shortness of breath.   Cardiovascular:  Positive for chest pain. Negative for palpitations and leg swelling.  Gastrointestinal:  Negative for abdominal pain, nausea and vomiting.    Past Medical History:  She,  has a past medical history of Epilepsy (HCC), Myopia, Presbyopia, Thyroid disease, and Tremors of nervous system.   Surgical History:   Past Surgical History:  Procedure Laterality Date   CESAREAN SECTION  1/93,1/95   LOBECTOMY  06/85   right temporal   TONSILLECTOMY     WISDOM TOOTH EXTRACTION       Social History:   reports that she has never smoked. She has never used smokeless tobacco. She reports that she does not drink alcohol and does not use drugs.   Family History:  Her family history includes Deep vein thrombosis in her mother; Diabetes in her paternal grandmother; Hypertension in her mother; Migraines in her mother; Nephrolithiasis in her father and mother; Stroke in her mother.    Allergies Allergies  Allergen Reactions   Eggs Or Egg-Derived Products     Pt    Sulfa Antibiotics    Wheat Bran      Home Medications  Prior to Admission medications   Medication Sig Start Date End Date Taking? Authorizing Provider  Ascorbic Acid (VITAMIN C) 100 MG tablet Take 100 mg by mouth daily.    [provider]  carvedilol (COREG) 3.125 MG tablet SMARTSIG:1 Tablet(s) By Mouth 06/14/20   [provider]  cholecalciferol (VITAMIN D) 1000 UNITS tablet Take 10,000 Units by mouth daily.    [provider]  clindamycin (CLEOCIN) 300 MG capsule Take 1 capsule (300 mg total) by mouth 3 (three) times daily. 09/04/13   09/06/13, NP  co-enzyme Q-10 30 MG capsule Take 30 mg by mouth 3 (three) times daily.    [provider]  DIGESTIVE ENZYMES PO Take by mouth.    [provider]  estradiol (VIVELLE-DOT) 0.05 MG/24HR patch 1 patch every 3 (three) days. 04/26/20   [provider]  HYDROcodone-acetaminophen (NORCO/VICODIN) 5-325 MG per  tablet Take 1-2 tablets by mouth every 6 (six) hours as needed. 09/04/13   Teressa Lower, NP  HYDROMET 5-1.5 MG/5ML syrup SMARTSIG:1.5 Teaspoon By Mouth Every Night 06/18/20   [provider]  hydroxyurea (HYDREA) 500 MG capsule Take 500 mg by mouth daily. 06/22/20   [provider]  ibuprofen (ADVIL,MOTRIN) 800 MG tablet Take 1 tablet (800 mg total) by mouth 3 (three) times daily. 09/04/13   Teressa Lower, NP  L-TYROSINE PO Take 50 mg by mouth.    [provider]  Multiple Minerals-Vitamins (CALCIUM & VIT D3 BONE HEALTH PO) Take by mouth.    [provider]  thiamine (VITAMIN B-1) 100 MG tablet Take 100 mg by mouth daily.    [provider]     Critical care time: n/a     Darcella Gasman Madell Heino, PA-C [facility] See Amion for pager If no response to pager , please call [phone] until 7pm After 7:00 pm call Elink   [phone]

## 2020-06-30 NOTE — ED Provider Notes (Addendum)
Physical Exam  BP 102/65   Pulse 89   Temp 98.6 F (37 C) (Oral)   Resp 20   Ht 1.575 m (5\' 2" )   Wt 55.3 kg   SpO2 97%   BMI 22.31 kg/m   Physical Exam  ED Course/Procedures   Clinical Course as of 06/30/20 0954  Tue Jun 30, 2020  0719 Patient's x-Lanyia Jewel independently reviewed by myself.  Complete white out of the left lung with what appears to be fluid.  She has some mediastinal shift but is not in tension pathology clinically.  Will obtain CT to better evaluate.  She is not on any anticoagulants.  Likely will need chest tube.  Blood cultures, lactate, and broad-spectrum antibiotics including MRSA coverage were given. [CH]    Clinical Course User Index [CH] Horton, 0720, MD    CHEST TUBE INSERTION  Date/Time: 06/30/2020 9:55 AM Performed by: 07/02/2020, MD Authorized by: Margarita Grizzle, MD   Consent:    Consent obtained:  Written   Consent given by:  Patient   Risks, benefits, and alternatives were discussed: yes     Risks discussed:  Bleeding, damage to surrounding structures, infection and pain   Alternatives discussed:  Delayed treatment Universal protocol:    Procedure explained and questions answered to patient or proxy's satisfaction: yes     Imaging studies available: yes     Patient identity confirmed:  Verbally with patient and hospital-assigned identification number Pre-procedure details:    Skin preparation:  Chlorhexidine   Preparation: Patient was prepped and draped in the usual sterile fashion   Sedation:    Sedation type:  None Anesthesia:    Anesthesia method:  Local infiltration Procedure details:    Placement location:  L lateral   Scalpel size:  11   Ultrasound guidance: yes     Tension pneumothorax: no     Drainage characteristics:  Purulent   Suture material:  0 silk   Dressing:  4x4 sterile gauze Post-procedure details:    Post-insertion x-Aleeha Boline findings: tube in good position     Procedure completion:  Tolerated  MDM  CT Chest  Wo Contrast  Result Date: 06/30/2020 CLINICAL DATA:  Fever, cough, shortness of breath for 8-10 days, LEFT chest and rib pain, chest x-Haik Mahoney with pleural effusion EXAM: CT CHEST WITHOUT CONTRAST TECHNIQUE: Multidetector CT imaging of the chest was performed following the standard protocol without IV contrast. Sagittal and coronal MPR images reconstructed from axial data set. COMPARISON:  None FINDINGS: Cardiovascular: Small pericardial effusion. Heart otherwise unremarkable. Aorta normal caliber. Minimal atherosclerotic calcification of aorta and coronary arteries. Mediastinum/Nodes: 11 mm LEFT thyroid nodule; Not clinically significant; no follow-up imaging recommended (ref: J Am Coll Radiol. 2015 Feb;12(2): 143-50).Esophagus unremarkable. Multiple though normal sized mediastinal lymph nodes. Mildly enlarged subcarinal lymph node 13 mm short axis image 56. Slight mediastinal shift LEFT to RIGHT. 7 mm inferior RIGHT cervical lymph node image 10. Lungs/Pleura: Subtotal opacification of the LEFT hemithorax by large pleural effusion. 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 Complete atelectasis of LEFT lower lobe with subtotal atelectasis of LEFT upper lobe. Occlusion of LEFT upper lobe bronchus at LEFT hilum. Marked narrowing of LEFT lower lobe bronchi. Findings suggest presence of tumor at the LEFT hilum though this is difficult to differentiate from adjacent atelectatic central lung and pleural based soft tissue in LEFT hemithorax. Upper Abdomen: Question small cyst at medial upper LEFT kidney  13 mm diameter. Abnormal soft tissue density in the anterolateral LEFT upper quadrant invading the abdominal wall 3.6 x 2.8 cm image 130 consistent with tumor. Musculoskeletal: No acute osseous findings. IMPRESSION: Subtotal opacification of the LEFT hemithorax by large pleural effusion with complete atelectasis of LEFT lower  lobe and subtotal atelectasis of LEFT upper lobe. 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 Marked narrowing of LEFT lower lobe bronchi with occlusion of LEFT upper lobe bronchus at LEFT hilum suspicious for hilar tumor, difficult to delineate due to adjacent opacified lung and pleural based tumor. Abnormal soft tissue density in the anterolateral LEFT upper quadrant invading the abdominal wall 3.6 x 2.8 cm consistent with tumor. Mildly enlarged subcarinal lymph node. Small pericardial effusion. Aortic Atherosclerosis (ICD10-I70.0). Electronically Signed   By: Ulyses Southward M.D.   On: 06/30/2020 08:04   DG Chest Portable 1 View  Result Date: 06/30/2020 CLINICAL DATA:  SOB EXAM: PORTABLE CHEST 1 VIEW COMPARISON:  None. FINDINGS: Obscured cardiomediastinal silhouette, mildly shifted towards the right. There is complete opacification of the left hemithorax. The right lung is clear. There is no visible pneumothorax. No acute osseous abnormality. IMPRESSION: Complete opacification of the left hemithorax, favored to be due to a large pleural effusion. Slight rightward mediastinal shift which can be seen with tension physiology. These results were called by telephone at the time of interpretation on 06/30/2020 at 7:13am to provider [facility] , who verbally acknowledged these results. Electronically Signed   By: Caprice Renshaw   On: 06/30/2020 07:15      Discussed with Dr. Myrla Halsted, on-call for critical care.  Patient accepted to a step down bed-they request that medicine admit. Patient has large left-sided density.  She is tachypneic here but sats have remained in the upper 90s on room air.  Patient appears stable for transport.     Margarita Grizzle, MD 06/30/20 7829    Margarita Grizzle, MD 06/30/20 [phone]

## 2020-06-30 NOTE — ED Triage Notes (Signed)
Pt reports onset of SOB about 8-10 days ago. "Occasional" fever, cough. Not covid vaccinated. Left shoulder, chest/rib pain.

## 2020-06-30 NOTE — Progress Notes (Signed)
Case reviewed with EDP. Large effusion question malignant, symptomatic with possible tension component. Would send to [facility] vs. [facility] under [facility] and we can place pigtail  Myrla Halsted MD PCCM

## 2020-06-30 NOTE — Sepsis Progress Note (Signed)
Notified provider of need to order lactic acid and blood cultures.  

## 2020-06-30 NOTE — ED Notes (Signed)
Per Dr. Rosalia Hammers waiting on second latic before continuing with fluids

## 2020-06-30 NOTE — ED Notes (Signed)
fluid drained approximately 1 liter of fluid taken off

## 2020-06-30 NOTE — H&P (Addendum)
History and Physical    Tionne Carelli LKG:[id number] DOB: January 16, 1956 DOA: 06/30/2020  Referring MD/NP/PA: EDP PCP:  Patient coming from: Home  Chief Complaint: Cough and shortness of breath  HPI: Sybil Shrader is a 65 year old female history of prior thyroid disease and tremors presented to the ED with ongoing dyspnea for 2 to 3 weeks, cough and left-sided chest pain. Her symptoms have been progressively worsening, was seen at [facility] ER and drop bridge today, she was noted to be tachypneic, chest x-ray and CT chest noted complete opacification of left hemopneumothorax, CT noted large left pleural effusion with concern for tumor at the hilum and pleural-based mass.  Case was discussed with pulmonary, decision made to transfer to [facility] for further management and work-up.  She had a chest tube placed in the ER, 1 L drained, this was capped off and subsequently sent to the hospital  Review of Systems: As per HPI otherwise 14 point review of systems negative.   Past Medical History:  Diagnosis Date   Epilepsy (HCC)    Myopia    Presbyopia    Thyroid disease    Tremors of nervous system    essential    Past Surgical History:  Procedure Laterality Date   CESAREAN SECTION  1/93,1/95   LOBECTOMY  06/85   right temporal   TONSILLECTOMY     WISDOM TOOTH EXTRACTION       reports that she has never smoked. She has never used smokeless tobacco. She reports that she does not drink alcohol and does not use drugs.  Allergies  Allergen Reactions   Eggs Or Egg-Derived Products     Pt    Sulfa Antibiotics    Wheat Bran     Family History  Problem Relation Age of Onset   Diabetes Paternal Grandmother    Nephrolithiasis Father    Migraines Mother    Hypertension Mother    Deep vein thrombosis Mother    Stroke Mother    Nephrolithiasis Mother      Prior to Admission medications   Medication Sig Start Date End Date Taking? Authorizing Provider  Ascorbic Acid (VITAMIN C) 100 MG tablet  Take 100 mg by mouth daily.   Yes [provider]  b complex vitamins capsule Take 1 capsule by mouth daily.   Yes [provider]  carvedilol (COREG) 3.125 MG tablet SMARTSIG:1 Tablet(s) By Mouth 06/14/20  Yes [provider]  cholecalciferol (VITAMIN D) 1000 UNITS tablet Take 10,000 Units by mouth daily.   Yes [provider]  estradiol (VIVELLE-DOT) 0.05 MG/24HR patch 1 patch every 3 (three) days. 04/26/20  Yes [provider]  fluticasone (FLONASE) 50 MCG/ACT nasal spray Place into both nostrils daily.   Yes [provider]  HYDROMET 5-1.5 MG/5ML syrup SMARTSIG:1.5 Teaspoon By Mouth Every Night 06/18/20  Yes [provider]  ibuprofen (ADVIL) 200 MG tablet Take 200 mg by mouth every 6 (six) hours as needed.   Yes [provider]  levocetirizine (XYZAL) 5 MG tablet Take 5 mg by mouth every evening.   Yes [provider]  clindamycin (CLEOCIN) 300 MG capsule Take 1 capsule (300 mg total) by mouth 3 (three) times daily. Patient not taking: No sig reported 09/04/13   Teressa Lower, NP  co-enzyme Q-10 30 MG capsule Take 30 mg by mouth 3 (three) times daily.    [provider]  DIGESTIVE ENZYMES PO Take by mouth.    [provider]  HYDROcodone-acetaminophen (NORCO/VICODIN) 5-325 MG  per tablet Take 1-2 tablets by mouth every 6 (six) hours as needed. Patient not taking: No sig reported 09/04/13   Teressa Lower, NP  hydroxyurea (HYDREA) 500 MG capsule Take 500 mg by mouth daily. Patient not taking: No sig reported 06/22/20   [provider]  ibuprofen (ADVIL,MOTRIN) 800 MG tablet Take 1 tablet (800 mg total) by mouth 3 (three) times daily. Patient not taking: No sig reported 09/04/13   Teressa Lower, NP  L-TYROSINE PO Take 50 mg by mouth.    [provider]  Multiple Minerals-Vitamins (CALCIUM & VIT D3 BONE HEALTH PO) Take by mouth.    [provider]  progesterone  (PROMETRIUM) 100 MG capsule Take 200 mg by mouth at bedtime. 06/07/20   [provider]  thiamine (VITAMIN B-1) 100 MG tablet Take 100 mg by mouth daily. Patient not taking: No sig reported    [provider]    Physical Exam: Vitals:   06/30/20 1519 06/30/20 1645 06/30/20 1650 06/30/20 1700  BP: 119/70 (!) 120/47  (!) 109/47  Pulse: 98 78 (!) 106 (!) 108  Resp: (!) 24 (!) 43 (!) 32 (!) 34  Temp: 98.1 F (36.7 C)     TempSrc: Oral     SpO2: 97% (!) 86% 93% 98%  Weight:      Height:          Constitutional: Thinly built female sitting up in bed, uncomfortable appearing, awake alert oriented x3, in respiratory distress HEENT: No JVD CVS: S1-S2, regular rhythm, tachycardic Lungs: Poor air movement on the left Abdomen: Soft, nontender, subcutaneous mass noted, bowel sounds present Extremities: No edema Skin: No rashes Neuro: Moves all extremities, no localizing signs Psych: Appropriate mood and affect  Labs on Admission: I have personally reviewed following labs and imaging studies  CBC: Recent Labs  Lab 06/30/20 0625  WBC 29.3*  NEUTROABS 24.7*  HGB 10.5*  HCT 30.6*  MCV 91.1  PLT 635*   Basic Metabolic Panel: Recent Labs  Lab 06/30/20 0625  NA 132*  K 3.7  CL 99  CO2 20*  GLUCOSE 118*  BUN 36*  CREATININE 0.88  CALCIUM 10.7*   GFR: Estimated Creatinine Clearance: 51.8 mL/min (by C-G formula based on SCr of 0.88 mg/dL). Liver Function Tests: No results for input(s): AST, ALT, ALKPHOS, BILITOT, PROT, ALBUMIN in the last 168 hours. No results for input(s): LIPASE, AMYLASE in the last 168 hours. No results for input(s): AMMONIA in the last 168 hours. Coagulation Profile: No results for input(s): INR, PROTIME in the last 168 hours. Cardiac Enzymes: No results for input(s): CKTOTAL, CKMB, CKMBINDEX, TROPONINI in the last 168 hours. BNP (last 3 results) No results for input(s): PROBNP in the last 8760 hours. HbA1C: No results for input(s):  HGBA1C in the last 72 hours. CBG: No results for input(s): GLUCAP in the last 168 hours. Lipid Profile: No results for input(s): CHOL, HDL, LDLCALC, TRIG, CHOLHDL, LDLDIRECT in the last 72 hours. Thyroid Function Tests: No results for input(s): TSH, T4TOTAL, FREET4, T3FREE, THYROIDAB in the last 72 hours. Anemia Panel: No results for input(s): VITAMINB12, FOLATE, FERRITIN, TIBC, IRON, RETICCTPCT in the last 72 hours. Urine analysis: No results found for: COLORURINE, APPEARANCEUR, LABSPEC, PHURINE, GLUCOSEU, HGBUR, BILIRUBINUR, KETONESUR, PROTEINUR, UROBILINOGEN, NITRITE, LEUKOCYTESUR Sepsis Labs: @LABRCNTIP (procalcitonin:4,lacticidven:4) ) Recent Results (from the past 240 hour(s))  Resp Panel by RT-PCR (Flu A&B, Covid) Nasopharyngeal Swab     Status: None   Collection Time: 06/30/20  6:42 AM   Specimen: Nasopharyngeal  Swab; Nasopharyngeal(NP) swabs in vial transport medium  Result Value Ref Range Status   SARS Coronavirus 2 by RT PCR NEGATIVE NEGATIVE Final    Comment: (NOTE) SARS-CoV-2 target nucleic acids are NOT DETECTED.  The SARS-CoV-2 RNA is generally detectable in upper respiratory specimens during the acute phase of infection. The lowest concentration of SARS-CoV-2 viral copies this assay can detect is 138 copies/mL. A negative result does not preclude SARS-Cov-2 infection and should not be used as the sole basis for treatment or other patient management decisions. A negative result may occur with  improper specimen collection/handling, submission of specimen other than nasopharyngeal swab, presence of viral mutation(s) within the areas targeted by this assay, and inadequate number of viral copies(<138 copies/mL). A negative result must be combined with clinical observations, patient history, and epidemiological information. The expected result is Negative.  Fact Sheet for Patients:  BloggerCourse.comhttps://www.fda.gov/media/152166/download  Fact Sheet for Healthcare Providers:   SeriousBroker.ithttps://www.fda.gov/media/152162/download  This test is no t yet approved or cleared by the [place] FDA and  has been authorized for detection and/or diagnosis of SARS-CoV-2 by FDA under an Emergency Use Authorization (EUA). This EUA will remain  in effect (meaning this test can be used) for the duration of the COVID-19 declaration under Section 564(b)(1) of the Act, 21 U.S.C.section 360bbb-3(b)(1), unless the authorization is terminated  or revoked sooner.       Influenza A by PCR NEGATIVE NEGATIVE Final   Influenza B by PCR NEGATIVE NEGATIVE Final    Comment: (NOTE) The Xpert Xpress SARS-CoV-2/FLU/RSV plus assay is intended as an aid in the diagnosis of influenza from Nasopharyngeal swab specimens and should not be used as a sole basis for treatment. Nasal washings and aspirates are unacceptable for Xpert Xpress SARS-CoV-2/FLU/RSV testing.  Fact Sheet for Patients: BloggerCourse.comhttps://www.fda.gov/media/152166/download  Fact Sheet for Healthcare Providers: SeriousBroker.ithttps://www.fda.gov/media/152162/download  This test is not yet approved or cleared by the [place] FDA and has been authorized for detection and/or diagnosis of SARS-CoV-2 by FDA under an Emergency Use Authorization (EUA). This EUA will remain in effect (meaning this test can be used) for the duration of the COVID-19 declaration under Section 564(b)(1) of the Act, 21 U.S.C. section 360bbb-3(b)(1), unless the authorization is terminated or revoked.  Performed at [facility], [address], [place], [place] [address]      Radiological Exams on Admission: CT Chest Wo Contrast  Result Date: 06/30/2020 CLINICAL DATA:  Fever, cough, shortness of breath for 8-10 days, LEFT chest and rib pain, chest x-ray with pleural effusion EXAM: CT CHEST WITHOUT CONTRAST TECHNIQUE: Multidetector CT imaging of the chest was performed following the standard protocol without IV contrast. Sagittal and coronal MPR images  reconstructed from axial data set. COMPARISON:  None FINDINGS: Cardiovascular: Small pericardial effusion. Heart otherwise unremarkable. Aorta normal caliber. Minimal atherosclerotic calcification of aorta and coronary arteries. Mediastinum/Nodes: 11 mm LEFT thyroid nodule; Not clinically significant; no follow-up imaging recommended (ref: J Am Coll Radiol. 2015 Feb;12(2): 143-50).Esophagus unremarkable. Multiple though normal sized mediastinal lymph nodes. Mildly enlarged subcarinal lymph node 13 mm short axis image 56. Slight mediastinal shift LEFT to RIGHT. 7 mm inferior RIGHT cervical lymph node image 10. Lungs/Pleura: Subtotal opacification of the LEFT hemithorax by large pleural effusion. 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 Complete atelectasis of LEFT lower lobe with subtotal atelectasis of LEFT upper lobe. Occlusion of LEFT upper lobe bronchus at LEFT hilum. Marked narrowing of LEFT  lower lobe bronchi. Findings suggest presence of tumor at the LEFT hilum though this is difficult to differentiate from adjacent atelectatic central lung and pleural based soft tissue in LEFT hemithorax. Upper Abdomen: Question small cyst at medial upper LEFT kidney 13 mm diameter. Abnormal soft tissue density in the anterolateral LEFT upper quadrant invading the abdominal wall 3.6 x 2.8 cm image 130 consistent with tumor. Musculoskeletal: No acute osseous findings. IMPRESSION: Subtotal opacification of the LEFT hemithorax by large pleural effusion with complete atelectasis of LEFT lower lobe and subtotal atelectasis of LEFT upper lobe. 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 Marked narrowing of LEFT lower lobe bronchi with occlusion of LEFT upper lobe bronchus at LEFT hilum suspicious for hilar tumor, difficult to  delineate due to adjacent opacified lung and pleural based tumor. Abnormal soft tissue density in the anterolateral LEFT upper quadrant invading the abdominal wall 3.6 x 2.8 cm consistent with tumor. Mildly enlarged subcarinal lymph node. Small pericardial effusion. Aortic Atherosclerosis (ICD10-I70.0). Electronically Signed   By: Ulyses Southward M.D.   On: 06/30/2020 08:04   DG Chest Port 1 View  Result Date: 06/30/2020 CLINICAL DATA:  Status post chest tube placement. EXAM: PORTABLE CHEST 1 VIEW COMPARISON:  Chest radiograph and CT 06/30/2020 FINDINGS: A left-sided chest tube has been placed terminating over the mid lung. There is a moderate residual left pleural effusion which appears partially loculated. Pleural fluid has decreased significantly following chest tube placement with partial re-expansion of the left lung. There is abnormal density in the left hilar and infrahilar regions as noted on CT. The right lung remains clear. No pneumothorax is identified. Mild thoracic dextroscoliosis is noted. IMPRESSION: Interval chest tube placement with decreased size of left pleural effusion and partial re-expansion of the left lung. Electronically Signed   By: Sebastian Ache M.D.   On: 06/30/2020 10:37   DG Chest Portable 1 View  Result Date: 06/30/2020 CLINICAL DATA:  SOB EXAM: PORTABLE CHEST 1 VIEW COMPARISON:  None. FINDINGS: Obscured cardiomediastinal silhouette, mildly shifted towards the right. There is complete opacification of the left hemithorax. The right lung is clear. There is no visible pneumothorax. No acute osseous abnormality. IMPRESSION: Complete opacification of the left hemithorax, favored to be due to a large pleural effusion. Slight rightward mediastinal shift which can be seen with tension physiology. These results were called by telephone at the time of interpretation on 06/30/2020 at 7:13am to provider [facility] , who verbally acknowledged these results. Electronically Signed   By: Caprice Renshaw   On: 06/30/2020 07:15    EKG: Independently reviewed.  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 -Chest tube placed in the ER and draw bridge -Follow-up pleural fluid cell count, pH, protein, LDH, cytology and cultures -Pulmonary consulted -Given associated fevers/leukocytosis will also add IV ceftriaxone and azithromycin  Left hilar mass/pleural-based mass Soft tissue mass -Await pleural fluid cytology, if this is negative would need CT-guided biopsy of soft tissue mass  Anemia, leukocytosis, thrombocytosis -Broad-spectrum antibiotics as above -Check anemia panel -Suspect this is likely secondary to underlying malignancy, secondary infection -Continue to trend  Abnormal EKG -Concerning for pericardial effusion, will check echo  DVT prophylaxis: Lovenox Code Status: Full code Family Communication: Discussed patient in detail, no family at bedside Disposition Plan: Home Consults called: Pulmonary Admission status: Inpatient  Zannie Cove MD  [facility]   06/30/2020, 5:09 PM

## 2020-06-30 NOTE — Progress Notes (Signed)
Received a phone call from Facility: [facility]  Requesting MD:  Ray Patient with h/o epilepsy and hypothyroidism presenting with SOB.  CXR indicates large effusion, ?malignant, ?tension component.  Pulmonology consulted and recommends admission to [facility] or [facility], needs pigtail catheter.  Dr. Rosalia Hammers to attempt to place pigtail prior to transfer given delay in bed availability. Plan of care: Consult Pulmonology upon arrival. The patient will be accepted for admission to progressive at [facility] when bed is available.     Nursing staff, Please call the [facility] Admits & Consults System-Wide number at the top of Amion at the time of the patient's arrival so that the patient can be paged to the admitting physician.   Georgana Curio, M.D. [facility]

## 2020-06-30 NOTE — ED Notes (Signed)
Lab Staff, Alvino Chapel, called that the nurse can bring the specimen from the chest to the lab.   I took the specimen to the lab and handed to Ms. Alvino Chapel.

## 2020-06-30 NOTE — ED Notes (Signed)
Pt's is between 90-97% on 2L. Pt states that she is doing ok. Resp. much better at 20-25

## 2020-06-30 NOTE — ED Notes (Signed)
iCE CHIPS GIVEN

## 2020-06-30 NOTE — ED Notes (Signed)
Attempted to call report, RN will call back.

## 2020-06-30 NOTE — ED Provider Notes (Signed)
MEDCENTER [facility] EMERGENCY DEPT Provider Note   CSN: [id number] Arrival date & time: 06/30/20  3662     History Chief Complaint  Patient presents with   Shortness of Breath    Jamie Carr is a 65 y.o. female.  HPI     This is a 65 year old female with a history of epilepsy, tremors who presents with shortness of breath and chest pain.  Patient reports that she had been dealing with a chronic nonproductive cough.  She states that the end of May she began to have some left lower chest and rib pain.  She states it is sharp.  It is worse with deep breathing and coughing.  She is also having some pain in the left shoulder.  She states she has had progressively worsening dyspnea on exertion.  She also states she gets very winded when talking.  She states she has had occasional fevers up to 100.8.  No known sick contacts or COVID exposures.  She is not vaccinated against COVID-19.  No history of hypertension, hyperlipidemia, diabetes.  Past Medical History:  Diagnosis Date   Epilepsy (HCC)    Myopia    Presbyopia    Seizures (HCC)    Thyroid disease    Tremors of nervous system    essential    Patient Active Problem List   Diagnosis Date Noted   Tremors of nervous system    Thyroid disease    Seizures (HCC)    Myopia     Past Surgical History:  Procedure Laterality Date   CESAREAN SECTION  1/93,1/95   LOBECTOMY  06/85   right temporal   TONSILLECTOMY     WISDOM TOOTH EXTRACTION       OB History     Gravida  3   Para  2   Term      Preterm      AB  1   Living  2      SAB  1   IAB      Ectopic      Multiple      Live Births              Family History  Problem Relation Age of Onset   Diabetes Paternal Grandmother    Nephrolithiasis Father    Migraines Mother    Hypertension Mother    Deep vein thrombosis Mother    Stroke Mother    Nephrolithiasis Mother     Social History   Tobacco Use   Smoking status: Never   Smokeless  tobacco: Never  Substance Use Topics   Alcohol use: No   Drug use: No    Home Medications Prior to Admission medications   Medication Sig Start Date End Date Taking? Authorizing Provider  Ascorbic Acid (VITAMIN C) 100 MG tablet Take 100 mg by mouth daily.    [provider]  carvedilol (COREG) 3.125 MG tablet SMARTSIG:1 Tablet(s) By Mouth 06/14/20   [provider]  cholecalciferol (VITAMIN D) 1000 UNITS tablet Take 10,000 Units by mouth daily.    [provider]  clindamycin (CLEOCIN) 300 MG capsule Take 1 capsule (300 mg total) by mouth 3 (three) times daily. 09/04/13   Teressa Lower, NP  co-enzyme Q-10 30 MG capsule Take 30 mg by mouth 3 (three) times daily.    [provider]  DIGESTIVE ENZYMES PO Take by mouth.    [provider]  estradiol (VIVELLE-DOT) 0.05 MG/24HR patch 1 patch every 3 (three) days. 04/26/20  [provider]  HYDROcodone-acetaminophen (NORCO/VICODIN) 5-325 MG per tablet Take 1-2 tablets by mouth every 6 (six) hours as needed. 09/04/13   Teressa LowerPickering, Vrinda, NP  HYDROMET 5-1.5 MG/5ML syrup SMARTSIG:1.5 Teaspoon By Mouth Every Night 06/18/20   [provider]  hydroxyurea (HYDREA) 500 MG capsule Take 500 mg by mouth daily. 06/22/20   [provider]  ibuprofen (ADVIL,MOTRIN) 800 MG tablet Take 1 tablet (800 mg total) by mouth 3 (three) times daily. 09/04/13   Teressa LowerPickering, Vrinda, NP  L-TYROSINE PO Take 50 mg by mouth.    [provider]  Multiple Minerals-Vitamins (CALCIUM & VIT D3 BONE HEALTH PO) Take by mouth.    [provider]  thiamine (VITAMIN B-1) 100 MG tablet Take 100 mg by mouth daily.    [provider]    Allergies    Eggs or egg-derived products, Sulfa antibiotics, and Wheat bran  Review of Systems   Review of Systems  Constitutional:  Positive for fever.  Respiratory:  Positive for cough and shortness of breath.   Cardiovascular:  Positive for chest pain.  Negative for leg swelling.  Gastrointestinal:  Negative for abdominal pain, nausea and vomiting.  All other systems reviewed and are negative.  Physical Exam Updated Vital Signs BP 102/65   Pulse 89   Temp 98.6 F (37 C) (Oral)   Resp 20   Ht 1.575 m (5\' 2" )   Wt 55.3 kg   SpO2 97%   BMI 22.31 kg/m   Physical Exam Vitals and nursing note reviewed.  Constitutional:      Appearance: She is well-developed. She is ill-appearing. She is not toxic-appearing.  HENT:     Head: Normocephalic and atraumatic.  Eyes:     Pupils: Pupils are equal, round, and reactive to light.  Cardiovascular:     Rate and Rhythm: Normal rate and regular rhythm.     Heart sounds: Normal heart sounds.  Pulmonary:     Effort: Tachypnea and respiratory distress present.     Breath sounds: No wheezing.     Comments: Increased respiratory rate, speaking in short sentences, diminished breath sounds throughout the whole left lung, no wheezing Chest:     Chest wall: Tenderness present. No crepitus.  Abdominal:     General: Bowel sounds are normal.     Palpations: Abdomen is soft.  Musculoskeletal:     Cervical back: Neck supple.     Right lower leg: No tenderness. No edema.     Left lower leg: No tenderness. No edema.  Skin:    General: Skin is warm and dry.  Neurological:     Mental Status: She is alert and oriented to person, place, and time.     Comments: Tremor noted  Psychiatric:        Mood and Affect: Mood normal.    ED Results / Procedures / Treatments   Labs (all labs ordered are listed, but only abnormal results are displayed) Labs Reviewed  CBC WITH DIFFERENTIAL/PLATELET - Abnormal; Notable for the following components:      Result Value   WBC 29.3 (*)    RBC 3.36 (*)    Hemoglobin 10.5 (*)    HCT 30.6 (*)    Platelets 635 (*)    All other components within normal limits  RESP PANEL BY RT-PCR (FLU A&B, COVID) ARPGX2  CULTURE, BLOOD (ROUTINE X 2)  CULTURE, BLOOD (ROUTINE X 2)   BASIC METABOLIC PANEL  LACTIC ACID, PLASMA  LACTIC ACID, PLASMA  TROPONIN I (HIGH SENSITIVITY)    EKG EKG Interpretation  Date/Time:  Tuesday June 30 2020 06:27:08 EDT Ventricular Rate:  92 PR Interval:  141 QRS Duration: 71 QT Interval:  327 QTC Calculation: 405 R Axis:   21 Text Interpretation: Sinus rhythm Abnormal R-wave progression, early transition ST elevation, consider inferior injury ST elevation II and aVF without reciprocal changes Confirmed by Ross Marcus ([id number]) on 06/30/2020 6:51:52 AM  Radiology DG Chest Portable 1 View  Result Date: 06/30/2020 CLINICAL DATA:  SOB EXAM: PORTABLE CHEST 1 VIEW COMPARISON:  None. FINDINGS: Obscured cardiomediastinal silhouette, mildly shifted towards the right. There is complete opacification of the left hemithorax. The right lung is clear. There is no visible pneumothorax. No acute osseous abnormality. IMPRESSION: Complete opacification of the left hemithorax, favored to be due to a large pleural effusion. Slight rightward mediastinal shift which can be seen with tension physiology. These results were called by telephone at the time of interpretation on 06/30/2020 at 7:13am to provider [facility] , who verbally acknowledged these results. Electronically Signed   By: Caprice Renshaw   On: 06/30/2020 07:15    Procedures .Critical Care  Date/Time: 06/30/2020 7:40 AM Performed by: Shon Baton, MD Authorized by: Shon Baton, MD   Critical care provider statement:    Critical care time (minutes):  45   Critical care was necessary to treat or prevent imminent or life-threatening deterioration of the following conditions:  Respiratory failure   Critical care was time spent personally by me on the following activities:  Discussions with consultants, evaluation of patient's response to treatment, examination of patient, ordering and performing treatments and interventions, ordering and review of laboratory studies, ordering and  review of radiographic studies, pulse oximetry, re-evaluation of patient's condition, obtaining history from patient or surrogate and review of old charts   Medications Ordered in ED Medications  lactated ringers infusion (has no administration in time range)  lactated ringers bolus 1,000 mL (has no administration in time range)    And  lactated ringers bolus 500 mL (has no administration in time range)    And  lactated ringers bolus 250 mL (has no administration in time range)  vancomycin (VANCOCIN) IVPB 1000 mg/200 mL premix (has no administration in time range)  cefTRIAXone (ROCEPHIN) 2 g in sodium chloride 0.9 % 100 mL IVPB (has no administration in time range)  azithromycin (ZITHROMAX) 500 mg in sodium chloride 0.9 % 250 mL IVPB (has no administration in time range)    ED Course  I have reviewed the triage vital signs and the nursing notes.  Pertinent labs & imaging results that were available during my care of the patient were reviewed by me and considered in my medical decision making (see chart for details).  Clinical Course as of 06/30/20 0738  Tue Jun 30, 2020  0719 Patient's x-ray independently reviewed by myself.  Complete white out of the left lung with what appears to be fluid.  She has some mediastinal shift but is not in tension pathology clinically.  Will obtain CT to better evaluate.  She is not on any anticoagulants.  Likely will need chest tube.  Blood cultures, lactate, and broad-spectrum antibiotics including MRSA coverage were given. [CH]    Clinical Course User Index [CH] Marik Sedore, Mayer Masker, MD   MDM Rules/Calculators/A&P                          Patient presents with shortness of  breath.  Also reports some left-sided rib pain and chest pain.  She is tachypneic but not hypoxic.  She has increased work of breathing.  Vital signs otherwise are reassuring.  She has diminished breath sounds on the left.  History is suggestive of infectious process.  COVID testing  sent.  Chest x-ray ordered.  Chest x-ray reviewed at the bedside and shows significant white out of the left lung likely related to pleural effusion.  Work-up was broadened to a sepsis work-up.  She was started on fluids and given broad-spectrum antibiotics.  White count is almost 30,000.  Her EKG did show some minimal ST elevation in 1 and aVF without reciprocal changes.  Her history is not consistent with ACS.  It is also not consistent with PE.  Work-up pending.  CT ordered to better evaluate pleural effusion on the left.  Patient signed out to oncoming provider.  Final Clinical Impression(s) / ED Diagnoses Final diagnoses:  SOB (shortness of breath)  Pleural effusion    Rx / DC Orders ED Discharge Orders     None        Davi Kroon, Mayer Masker, MD 06/30/20 0740

## 2020-06-30 NOTE — ED Notes (Signed)
Per Dr. Rosalia Hammers, hold off on further fluids

## 2020-06-30 NOTE — ED Notes (Signed)
Poke to lab to confirm second latic

## 2020-06-30 NOTE — ED Notes (Signed)
Chest Tube wavier signed

## 2020-06-30 NOTE — ED Notes (Signed)
Per Dr. Rosalia Hammers with 2nd lactic back WDL to withhold other fluids ordered

## 2020-06-30 NOTE — ED Notes (Signed)
Pt states that she feels fine except for when she coughs. Pt given a pillow and instructions on splinting  with the pillow

## 2020-06-30 NOTE — ED Notes (Signed)
Placed on 2L, rep 30-40's RT notified

## 2020-06-30 NOTE — ED Notes (Signed)
Patient transported to CT 

## 2020-06-30 NOTE — ED Notes (Signed)
ED Provider at bedside. 

## 2020-06-30 NOTE — Sepsis Progress Note (Signed)
eLink is monitoring this Code Sepsis. °

## 2020-06-30 NOTE — Progress Notes (Signed)
Pt states she will accept blood products. She said 2 weeks ago she refused them after talking to a doctor that she sees for arthritis. Pt states she will now accept blood products. Notified pt's rn and Dr Jomarie Longs in pt's presence that she will now accept all blood products. Briscoe Burns BSN, RN-BC Admissions RN 06/30/2020 4:27 PM

## 2020-06-30 NOTE — ED Notes (Signed)
Prep started.

## 2020-07-01 ENCOUNTER — Other Ambulatory Visit (HOSPITAL_COMMUNITY): Payer: BC Managed Care – PPO

## 2020-07-01 ENCOUNTER — Inpatient Hospital Stay (HOSPITAL_COMMUNITY): Payer: BC Managed Care – PPO

## 2020-07-01 DIAGNOSIS — J869 Pyothorax without fistula: Principal | ICD-10-CM

## 2020-07-01 DIAGNOSIS — J948 Other specified pleural conditions: Secondary | ICD-10-CM

## 2020-07-01 DIAGNOSIS — R918 Other nonspecific abnormal finding of lung field: Secondary | ICD-10-CM

## 2020-07-01 DIAGNOSIS — E440 Moderate protein-calorie malnutrition: Secondary | ICD-10-CM | POA: Diagnosis present

## 2020-07-01 DIAGNOSIS — R0602 Shortness of breath: Secondary | ICD-10-CM

## 2020-07-01 LAB — COMPREHENSIVE METABOLIC PANEL
ALT: 35 U/L (ref 0–44)
AST: 32 U/L (ref 15–41)
Albumin: 1.8 g/dL — ABNORMAL LOW (ref 3.5–5.0)
Alkaline Phosphatase: 141 U/L — ABNORMAL HIGH (ref 38–126)
Anion gap: 8 (ref 5–15)
BUN: 33 mg/dL — ABNORMAL HIGH (ref 8–23)
CO2: 21 mmol/L — ABNORMAL LOW (ref 22–32)
Calcium: 8.9 mg/dL (ref 8.9–10.3)
Chloride: 103 mmol/L (ref 98–111)
Creatinine, Ser: 0.78 mg/dL (ref 0.44–1.00)
GFR, Estimated: >60 mL/min (ref >60)
Glucose, Bld: 106 mg/dL — ABNORMAL HIGH (ref 70–99)
Potassium: 3.5 mmol/L (ref 3.5–5.1)
Sodium: 132 mmol/L — ABNORMAL LOW (ref 135–145)
Total Bilirubin: 0.7 mg/dL (ref 0.3–1.2)
Total Protein: 5.4 g/dL — ABNORMAL LOW (ref 6.5–8.1)

## 2020-07-01 LAB — LACTATE DEHYDROGENASE, PLEURAL OR PERITONEAL FLUID: LD, Fluid: >10000 U/L — ABNORMAL HIGH (ref 3–23)

## 2020-07-01 LAB — TRIGLYCERIDES, BODY FLUIDS: Triglycerides, Fluid: 47 mg/dL

## 2020-07-01 LAB — CBC
HCT: 28.4 % — ABNORMAL LOW (ref 36.0–46.0)
Hemoglobin: 9.4 g/dL — ABNORMAL LOW (ref 12.0–15.0)
MCH: 31 pg (ref 26.0–34.0)
MCHC: 33.1 g/dL (ref 30.0–36.0)
MCV: 93.7 fL (ref 80.0–100.0)
Platelets: 571 10*3/uL — ABNORMAL HIGH (ref 150–400)
RBC: 3.03 MIL/uL — ABNORMAL LOW (ref 3.87–5.11)
RDW: 13.9 % (ref 11.5–15.5)
WBC: 21.7 10*3/uL — ABNORMAL HIGH (ref 4.0–10.5)
nRBC: 0 % (ref 0.0–0.2)

## 2020-07-01 LAB — HIV ANTIBODY (ROUTINE TESTING W REFLEX): HIV Screen 4th Generation wRfx: NONREACTIVE

## 2020-07-01 LAB — GLUCOSE, PLEURAL OR PERITONEAL FLUID: Glucose, Fluid: 43 mg/dL

## 2020-07-01 LAB — CYTOLOGY - NON PAP

## 2020-07-01 MED ORDER — ADULT MULTIVITAMIN W/MINERALS CH
1.0000 | ORAL_TABLET | Freq: Every day | ORAL | Status: DC
  Administered 2020-07-01 – 2020-07-15 (×15): 1 via ORAL
  Filled 2020-07-01 (×15): qty 1

## 2020-07-01 MED ORDER — POLYETHYLENE GLYCOL 3350 17 G PO PACK
17.0000 g | PACK | Freq: Every day | ORAL | Status: DC
  Administered 2020-07-02 – 2020-07-03 (×2): 17 g
  Filled 2020-07-01 (×8): qty 1

## 2020-07-01 MED ORDER — SODIUM CHLORIDE 0.9 % IV BOLUS
1000.0000 mL | Freq: Once | INTRAVENOUS | Status: AC
  Administered 2020-07-01: 1000 mL via INTRAVENOUS

## 2020-07-01 MED ORDER — POTASSIUM CHLORIDE CRYS ER 20 MEQ PO TBCR
40.0000 meq | EXTENDED_RELEASE_TABLET | Freq: Once | ORAL | Status: AC
  Administered 2020-07-01: 40 meq via ORAL
  Filled 2020-07-01: qty 2

## 2020-07-01 MED ORDER — OXYCODONE HCL 5 MG PO TABS
5.0000 mg | ORAL_TABLET | Freq: Four times a day (QID) | ORAL | Status: DC | PRN
Start: 2020-07-01 — End: 2020-07-01

## 2020-07-01 MED ORDER — FENTANYL CITRATE (PF) 100 MCG/2ML IJ SOLN
INTRAMUSCULAR | Status: AC
  Administered 2020-07-01: 100 ug via INTRAVENOUS
  Filled 2020-07-01: qty 2

## 2020-07-01 MED ORDER — MIDAZOLAM HCL 2 MG/2ML IJ SOLN
INTRAMUSCULAR | Status: AC
  Administered 2020-07-01: 1 mg
  Filled 2020-07-01: qty 2

## 2020-07-01 MED ORDER — TRAMADOL HCL 50 MG PO TABS
25.0000 mg | ORAL_TABLET | Freq: Four times a day (QID) | ORAL | Status: DC | PRN
  Administered 2020-07-01 – 2020-07-02 (×3): 50 mg via ORAL
  Administered 2020-07-02: 25 mg via ORAL
  Administered 2020-07-03 (×3): 50 mg via ORAL
  Administered 2020-07-04: 25 mg via ORAL
  Administered 2020-07-04 – 2020-07-07 (×3): 50 mg via ORAL
  Filled 2020-07-01 (×13): qty 1

## 2020-07-01 MED ORDER — BOOST / RESOURCE BREEZE PO LIQD CUSTOM: 1.0000 | ORAL | Status: DC

## 2020-07-01 MED ORDER — KETAMINE HCL 50 MG/5ML IJ SOSY
50.0000 mg | PREFILLED_SYRINGE | Freq: Once | INTRAMUSCULAR | Status: AC
  Administered 2020-07-01: 50 mg via INTRAVENOUS
  Filled 2020-07-01: qty 5

## 2020-07-01 MED ORDER — KETAMINE HCL-SODIUM CHLORIDE 100-0.9 MG/10ML-% IV SOSY
1.0000 mg/kg | PREFILLED_SYRINGE | Freq: Once | INTRAVENOUS | Status: DC
  Filled 2020-07-01 (×2): qty 10

## 2020-07-01 MED ORDER — LIDOCAINE HCL 1 % IJ SOLN
30.0000 mL | Freq: Once | INTRAMUSCULAR | Status: AC
  Administered 2020-07-01: 30 mL via INTRADERMAL
  Filled 2020-07-01: qty 40

## 2020-07-01 MED ORDER — FENTANYL CITRATE (PF) 100 MCG/2ML IJ SOLN
100.0000 ug | Freq: Once | INTRAMUSCULAR | Status: AC
  Administered 2020-07-01: 100 ug via INTRAVENOUS
  Filled 2020-07-01: qty 4

## 2020-07-01 MED ORDER — DOCUSATE SODIUM 50 MG/5ML PO LIQD
100.0000 mg | Freq: Two times a day (BID) | ORAL | Status: DC
  Administered 2020-07-01 – 2020-07-02 (×3): 100 mg
  Filled 2020-07-01 (×29): qty 10

## 2020-07-01 MED ORDER — FENTANYL CITRATE (PF) 100 MCG/2ML IJ SOLN
50.0000 ug | INTRAMUSCULAR | Status: DC | PRN
Start: 2020-07-01 — End: 2020-07-16
  Administered 2020-07-01 (×2): 100 ug via INTRAVENOUS
  Administered 2020-07-01: 50 ug via INTRAVENOUS
  Administered 2020-07-09: 100 ug via INTRAVENOUS
  Filled 2020-07-01 (×4): qty 2
  Filled 2020-07-01: qty 4
  Filled 2020-07-01: qty 2

## 2020-07-01 MED ORDER — FENTANYL CITRATE (PF) 100 MCG/2ML IJ SOLN
INTRAMUSCULAR | Status: AC
  Administered 2020-07-01: 50 ug via INTRAVENOUS
  Filled 2020-07-01: qty 2

## 2020-07-01 MED ORDER — SODIUM CHLORIDE 0.9 % IV SOLN
2.0000 g | INTRAVENOUS | Status: DC
  Administered 2020-07-01 – 2020-07-02 (×2): 2 g via INTRAVENOUS
  Filled 2020-07-01 (×2): qty 20

## 2020-07-01 MED ORDER — KATE FARMS STANDARD 1.4 PO LIQD
325.0000 mL | Freq: Every day | ORAL | Status: DC
  Filled 2020-07-01 (×15): qty 325

## 2020-07-01 MED ORDER — DEXMEDETOMIDINE HCL IN NACL 200 MCG/50ML IV SOLN
0.0000 ug/kg/h | INTRAVENOUS | Status: DC
  Administered 2020-07-01: 0.4 ug/kg/h via INTRAVENOUS
  Filled 2020-07-01 (×3): qty 50

## 2020-07-01 NOTE — Progress Notes (Signed)
NAME:  Giannamarie Paulus MRN:  [id number] DOB:  1955-12-09 LOS: 1 ADMISSION DATE:  06/30/2020 CONSULTATION DATE:  06/30/2020 REFERRING MD: Dr. Dina Rich - EDP CHIEF COMPLAINT:  SOB, Pleural effusion   History of Present Illness:  65 year old female with PMHx significant for epilepsy, thyroid disease and tremors who presented to [place] ED with complaints of dyspnea and left-sided chest pain x 2-3 weeks (worse with deep breathing and coughing, radiating to L shoulder). Dyspnea was progressive prompting her to seek medical attention 6/14.   In the ED, patient was tachypneic but no hypoxia was observed. CXR demonstrated complete opacification of the left hemithorax suggestive of large pleural effusion. CT Chest obtained to further characterize effusion showed large left-sided pleural effusion as well as concern for tumor at the left hilum and a pleural based mass.   PCCM asked to consult for effusion management.   Pertinent Medical History:  Epilepsy, thyroid disease, tremor, myopia, presbyopia  Significant Hospital Events: Including procedures, antibiotic start and stop dates in addition to other pertinent events   6/14 - Admit to [facility], transfer to [facility] consult for management of effusion, CT placed by EDP 6/15 - Increased pain at CT insertion site, CXR demonstrating possible kink at insertion site/L chest wall  Interim History / Subjective:  Increased pain this AM at CT insertion site Per RN, unable to flush CXR with ?kink at site No other significant issues overnight  Objective:  Blood pressure (!) 103/51, pulse 86, temperature 97.7 F (36.5 C), temperature source Oral, resp. rate (!) 32, height _0  (1.575 m), weight 55.3 kg, SpO2 98 %.        Intake/Output Summary (Last 24 hours) at 07/01/2020 0926 Last data filed at 07/01/2020 0600 Gross per 24 hour  Intake 2931.5 ml  Output 80 ml  Net 2851.5 ml   Filed Weights   06/30/20 0625  Weight: 55.3 kg   Physical  Examination: General: Acutely ill-appearing middle-aged woman in NAD. HEENT: [place]/AT, anicteric sclera, PERRL, moist mucous membranes. Neuro: Awake, oriented x 4. Responds to verbal stimuli. Following commands consistently. Moves all 4 extremities spontaneously.  CV: RRR, no m/g/r. PULM: Breathing even and unlabored on 2LNC. Lung fields CTA on right, significantly diminished on L. CT in place with scant serous drainage around tubing, no erythema. GI: Soft, nontender, nondistended. Normoactive bowel sounds. Extremities: No LE edema noted. Skin: Warm/dry, no rashes.  Labs/imaging that I have personally reviewed: (right click and "Reselect all SmartList Selections" daily)  CXR 6/15 Left pigtail catheter remains in place with some possible kinking at the insertion site along the left chest wall.   Increasing size of the loculated left pleural effusion.   Heterogeneous opacity within the residually aerated portions of the left lung likely reflect a combination of airspace disease and passive atelectasis.  WBC 21.7 (23.0). H&H 9.4/28.4, Plt 571  Na 132, K 3.5, CO2 21, BUN 33, Cr 0.78 Ca 8.9 AST 32, ALT 35, Alk Phos 141, Tbili 0.7  Resolved Hospital Problem List:     Assessment & Plan:  Large left sided pleural effusion, concern for malignancy, present on admission Based on CT findings, high suspicion for malignant effusion. - S/p placement of small bore CT by EDP - CT repositioning versus replacement as tube is malpositioned/not draining well - F/u pleural fluid studies - Continue empiric Azithromycin, Ceftriaxone, Vanc - Continue O2 via [place], wean as able for O2 sat > 90%   Left hilar mass, concern for malignancy Left pleural mass,  concern for malignancy - F/u pleural fluid studies, will ideally guide further diagnosis/treatment - F/u cytology today, 6/15 - Repeat CT Chest once effusion resolved for better characterization of chest/possible masses - If pleural fluid  unremarkable/indeterminate, may need bronch for biopsy - May need eventual TCTS consult for VATS vs. alternative pleural intervention   Leukocytosis of uncertain etiology Infectious vs. malignancy - Workup per primary team - F/u pending BCx  Best Practice: (right click and "Reselect all SmartList Selections" daily)  Diet:  Oral Pain/Anxiety/Delirium protocol (if indicated): No VAP protocol (if indicated): Not indicated DVT prophylaxis: LMWH GI prophylaxis: N/A Glucose control:  SSI No Central venous access:  N/A Arterial line:  N/A Foley:  N/A Mobility:  OOB  PT consulted: Yes Last date of multidisciplinary goals of care discussion [Per primary team] Code Status:  full code Disposition: ICU  Critical care time: 31 minutes   Lestine Mount, PA-C [facility] 07/01/20 9:26 AM  Please see [email or web address] for pager details.  From 7A-7P if no response, please call [phone] After hours, please call E-Link [phone]

## 2020-07-01 NOTE — TOC Initial Note (Signed)
Transition of Care [facility]) - Initial/Assessment Note    Patient Details  Name: Jamie Carr MRN: [id number] Date of Birth: May 31, 1955  Transition of [facility] CM/SW Contact:    Golda Acre, RN Phone Number: 07/01/2020, 8:50 AM  Clinical Narrative:                 65 year old female history of prior thyroid disease and tremors presented to the ED with ongoing dyspnea for 2 to 3 weeks, cough and left-sided chest pain. Her symptoms have been progressively worsening, was seen at [facility] ER and drop bridge today, she was noted to be tachypneic, chest x-ray and CT chest noted complete opacification of left hemopneumothorax, CT noted large left pleural effusion with concern for tumor at the hilum and pleural-based mass.  Case was discussed with pulmonary, decision made to transfer to [facility] for further management and work-up.  She had a chest tube placed in the ER, 1 L drained, this was capped off and subsequently sent to the hospital  PLAN: to return to home with self care has chest tube in place and capped.  Expected Discharge Plan: Home/Self Care Barriers to Discharge: Continued Medical Work up   Patient Goals and CMS Choice Patient states their goals for this hospitalization and ongoing recovery are:: to go home CMS Medicare.gov Compare Post Acute Care list provided to:: Patient    Expected Discharge Plan and Services Expected Discharge Plan: Home/Self Care   Discharge Planning Services: CM Consult   Living arrangements for the past 2 months: Single Family Home                                      Prior Living Arrangements/Services Living arrangements for the past 2 months: Single Family Home Lives with:: Spouse Patient language and need for interpreter reviewed:: Yes Do you feel safe going back to the place where you live?: Yes            Criminal Activity/Legal Involvement Pertinent to Current Situation/Hospitalization: No - Comment as needed  Activities of  Daily Living Home Assistive Devices/Equipment: Eyeglasses ADL Screening (condition at time of admission) Patient's cognitive ability adequate to safely complete daily activities?: Yes Is the patient deaf or have difficulty hearing?: No Does the patient have difficulty seeing, even when wearing glasses/contacts?: No Does the patient have difficulty concentrating, remembering, or making decisions?: No Patient able to express need for assistance with ADLs?: Yes Does the patient have difficulty dressing or bathing?: Yes Independently performs ADLs?: No Communication: Independent Dressing (OT): Needs assistance Is this a change from baseline?: Change from baseline, expected to last >3 days Grooming: Independent Feeding: Independent Bathing: Needs assistance Is this a change from baseline?: Change from baseline, expected to last >3 days Toileting: Needs assistance Is this a change from baseline?: Change from baseline, expected to last >3days In/Out Bed: Needs assistance Is this a change from baseline?: Change from baseline, expected to last >3 days Walks in Home: Needs assistance Is this a change from baseline?: Change from baseline, expected to last >3 days Does the patient have difficulty walking or climbing stairs?: Yes Weakness of Legs: Both Weakness of Arms/Hands: Both  Permission Sought/Granted                  Emotional Assessment Appearance:: Appears stated age Attitude/Demeanor/Rapport: Engaged Affect (typically observed): Calm Orientation: : Oriented to Place, Oriented to Self, Oriented to  Time, Oriented to Situation Alcohol / Substance Use: Not Applicable Psych Involvement: No (comment)  Admission diagnosis:  SOB (shortness of breath) [R06.02] Pleural effusion [J90] Patient Active Problem List   Diagnosis Date Noted   Pleural effusion 06/30/2020   Tremors of nervous system    Thyroid disease    Seizures (HCC)    Myopia    PCP:  Henreitta Leber,  PA-C Pharmacy:   [facility] [phone] - [place], [place] - [address] AT [facility] [address] [place] [address] Phone: [phone] Fax: [phone]  [facility] [place], [place] - [address] [facility] [address] [place] [address] Phone: [phone] Fax: [phone]     Social Determinants of Health (SDOH) Interventions    Readmission Risk Interventions No flowsheet data found.

## 2020-07-01 NOTE — Progress Notes (Signed)
While patient was getting chest tube placed at bedside with PCCM, charge RN Jon Gills was monitoring patient and assisting in procedure. Verbal order was given by [facility] for 1L bolus of NS. That was administered by this RN and then another verbal order for bolus of NS was given when BP was not able to be maintained with MAP >60. MD and PA aware and new orders were given. Will continue to monitor.

## 2020-07-01 NOTE — Progress Notes (Signed)
PCCM Interval Progress Note  At ~1225 6/15, patient was noted to be in respiratory distress with dropping O2 saturation and tachypnea to 40s. Nursing team and PCCM (Dr. Marchelle Gearing and myself) arrived at bedside. NRB mask was placed on patient with O2 sat 100%. CXR was obtained STAT post-CT placement and demonstrated CT abutting mediastinum without ability to rule out reexpansion edema. No pneumothorax was noted on CXR. Chest tube was removed from suction.  Patient appeared to be in pain and Fentanyl and Versed 1mg  were administered with resultant BP drop to 67/40. Fluid bolus (NS ) was ordered with repeat BP 109/76. Intermittent Fentanyl PRN and Precedex were ordered for patient's comfort.  At ~1241, patient appeared comfortable with improving VS. Will continue to monitor patient closely.  , PA-C [facility] 07/01/20 12:39 PM  Please see [email or web address] for pager details.  From 7A-7P if no response, please call [phone] After hours, please call ELink [phone]

## 2020-07-01 NOTE — Progress Notes (Signed)
Currently off precedex gtt Doing well on fent prn and versed prn On [place] Chest tube has drained Maintain BP  Plan Continue to monitor    SIGNATURE    Dr. Kalman Shan, M.D., F.C.C.P,  Pulmonary and Critical Care Medicine Staff Physician, [facility] Director - Interstitial Lung Disease  Program  Pulmonary Fibrosis [facility] at [facility] [place], [place], [address]  Pager: [phone], If no answer  OR between  19:00-7:00h: page [phone] Telephone (clinical office): [phone] Telephone (research): [phone]  6:24 PM 07/01/2020

## 2020-07-01 NOTE — Progress Notes (Signed)
PROGRESS NOTE    Jamie Carr  ZOX:[id number] DOB: Aug 20, 1955 DOA: 06/30/2020 PCP: Henreitta Leber, PA-C   Brief Narrative:  The patient is a 65 year old thin Caucasian female with a past medical history significant for but not limited to prior thyroid disease and tremors as well as other comorbidities who presented to the ED with ongoing dyspnea for 2 to 3 weeks, cough and left-sided chest pain.  Her symptoms progressively started worsening and she was seen in the [facility] ER at [facility] and initial evaluation she is found to be tachypneic with a chest x-ray and chest CT noting complete opacification of the left hemipneumothorax.  CT noted a large left pleural effusion with concern for tumor at the hilum and pleural-based mass.  The case was discussed with pulmonary and the decision was made to transfer the patient to [facility] for further evaluation management.  In the ED she had a left-sided chest tube placed and 1 L was drained and this was and subsequently sent to the hospital.  Of note her Pleurx catheter stopped working and had to be exchanged out by pulmonary today and subsequently she was complaining of pain so she was placed on Precedex drip and became hypotensive.  Pulmonary is managing closely with a low threshold to start pressors.  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 Katrinka Blazing reevaluated and pulled initial chest tube and replaced it and now it is draining appropriately -WBC went from 29.3 -> 23.0 -> 21.7 -Repeat CXR this Afternoon showed "Significant reduction in left-sided pleural effusion. Pleural margins are somewhat thickened which corresponds to the pleural density seen on recent CT examination.  Left lower lobe infiltrate" -SpO2: 100 % O2 Flow Rate (L/min): 2 L/min -Continuous pulse oximetry maintain O2 saturation greater 90% -Because of the patient discomfort she was placed on a Precedex drip by pulmonary and will continue per their recommendations -Continue supplemental oxygen via nasal cannula and wean O2 as tolerated  Hypotension -In the setting of the Precedex drip -Pulmonary is providing boluses for the patient -Continue monitor blood pressures per protocol -If necessary may need low-dose pressors -Blood pressure dropped to 80/40 this afternoon  Normocytic Anemia -Likely in the setting of Malignancy -Patient's Hgb/Hct went from 10.5/30.6 -> 10.6/31.6 -> 9.4/28.4 -Check Anemia Panel in the AM  Thrombocytosis -Likely Reactive from Above -Patient's Platelet Count went from 635 -> 620 -> 571 -Continue to Monitor and Trend -Repeat CBC in the AM  Hyponatremia -Patient's Na+ was 132 -Concern for Malignancy -Continue to Monitor and Trend -Repeat CMP in the AM  Metabolic Acidosis -Mild. Patient's CO2 is now 21, AG is 8, and Chloride Level is 103 -Continue to Monitor and Trend -Repeat CMP in the AM   Abnormal EKG -Was concerning for pericardial effusion -We will check cardiac echo  Left  hilar mass and left pleural soft tissue mass with concern for malignancy -She had a pending pleural fluid studies which we will need to follow cytology for -Pulmonary recommending repeating chest CT once effusion is  resolved for better characterization of chest possible masses -Per pulmonary if pleural fluid remains unremarkable/indeterminate may need a bronchoscopy for biopsy  Non-Severe (Moderate) Malnutrition in the context of Acute Illness/Injury  -Nutritionist consulted for further evaluation and recommendations -The nutritionist team is recommending boost breeze once a day as well as Molli Posey 1.4 p.o. once a day and a multivitamin with minerals daily  DVT prophylaxis: Enoxaparin 40 mg sq q24h Code Status: FULL CODE  Family Communication: No family present at bedside  Disposition Plan: Pending further clearance and evaluation by pulmonary; patient also need PT OT to further evaluate and treat  Status is: Inpatient  Remains inpatient appropriate because:Ongoing diagnostic testing needed not appropriate for outpatient work up, Unsafe d/c plan, IV treatments appropriate due to intensity of illness or inability to take PO, and Inpatient level of care appropriate due to severity of illness  Dispo: The patient is from: Home              Anticipated d/c is to: Home              Patient currently is not medically stable to d/c.   Difficult to place patient No  Consultants:  Pulmonary  Procedures: Chest Tube Insertion  Antimicrobials:  Anti-infectives (From admission, onward)    Start     Dose/Rate Route Frequency Ordered Stop   07/01/20 1045  cefTRIAXone (ROCEPHIN) 2 g in sodium chloride 0.9 % 100 mL IVPB        2 g 200 mL/hr over 30 Minutes Intravenous Every 24 hours 07/01/20 0947     07/01/20 1000  cefTRIAXone (ROCEPHIN) 1 g in sodium chloride 0.9 % 100 mL IVPB  Status:  Discontinued        1 g 200 mL/hr over 30 Minutes Intravenous Every 24 hours 06/30/20 1723 07/01/20 0947   07/01/20 1000  azithromycin (ZITHROMAX) 500 mg in sodium chloride 0.9 % 250 mL IVPB        500 mg 250 mL/hr over 60 Minutes Intravenous Every 24 hours 06/30/20 1723     06/30/20 0700  vancomycin (VANCOCIN) IVPB 1000  mg/200 mL premix        1,000 mg 200 mL/hr over 60 Minutes Intravenous  Once 06/30/20 0655 06/30/20 1134   06/30/20 0700  cefTRIAXone (ROCEPHIN) 2 g in sodium chloride 0.9 % 100 mL IVPB        2 g 200 mL/hr over 30 Minutes Intravenous  Once 06/30/20 0655 06/30/20 1017   06/30/20 0700  azithromycin (ZITHROMAX) 500 mg in sodium chloride 0.9 % 250 mL IVPB        500 mg 250 mL/hr over 60 Minutes Intravenous  Once 06/30/20 0655 06/30/20 1030        Subjective: Seen and examined at bedside and she was still little dyspneic.  Also complaining of significant left-sided chest pain.  No nausea or vomiting.  No other concerns or complaints at this time and does not have a family history of cancer and states that she does not smoke at all.  Objective: Vitals:   07/01/20 1430 07/01/20 1500 07/01/20 1530 07/01/20 1600  BP: (!) 78/47 (!) 83/47 (!) 76/46   Pulse: 80 77 76   Resp: (!) 32 (!) 26 (!) 24   Temp:  97.9 F (36.6 C)  TempSrc:    Oral  SpO2: 100% 99% 100%   Weight:      Height:        Intake/Output Summary (Last 24 hours) at 07/01/2020 1604 Last data filed at 07/01/2020 1509 Gross per 24 hour  Intake 974.97 ml  Output 80 ml  Net 894.97 ml   Filed Weights   06/30/20 0625  Weight: 55.3 kg   Examination: Physical Exam:  Constitutional: Thin cachectic Caucasian female currently in mild distress Eyes: Lids and conjunctivae normal, sclerae anicteric  ENMT: External Ears, Nose appear normal. Grossly normal hearing.  Neck: Appears normal, supple, no cervical masses, normal ROM, no appreciable thyromegaly; no JVD Respiratory: Diminished to auscultation bilaterally with coarse breath sounds worse on the left compared to right with some crackles noted.  Has increased respiratory effort and she is wearing supplemental oxygen via nasal cannula Cardiovascular: RRR, no murmurs / rubs / gallops. S1 and S2 auscultated. No extremity edema. 2+ pedal pulses. No carotid bruits.  Abdomen: Soft,  non-tender, non-distended. No masses palpated. No appreciable hepatosplenomegaly. Bowel sounds positive.  GU: Deferred. Musculoskeletal: No clubbing / cyanosis of digits/nails. No joint deformity upper and lower extremities.  Skin: No rashes, lesions, ulcers on a limited skin evaluation. No induration; Warm and dry.  Neurologic: CN 2-12 grossly intact with no focal deficits. Romberg sign and cerebellar reflexes not assessed.  Psychiatric: Normal judgment and insight. Alert and oriented x 3. Anxious mood and appropriate affect.   Data Reviewed: I have personally reviewed following labs and imaging studies  CBC: Recent Labs  Lab 06/30/20 0625 06/30/20 1922 07/01/20 0246  WBC 29.3* 23.0* 21.7*  NEUTROABS 24.7*  --   --   HGB 10.5* 10.6* 9.4*  HCT 30.6* 31.6* 28.4*  MCV 91.1 93.2 93.7  PLT 635* 620* 571*   Basic Metabolic Panel: Recent Labs  Lab 06/30/20 0625 06/30/20 1922 07/01/20 0246  NA 132*  --  132*  K 3.7  --  3.5  CL 99  --  103  CO2 20*  --  21*  GLUCOSE 118*  --  106*  BUN 36*  --  33*  CREATININE 0.88 0.90 0.78  CALCIUM 10.7*  --  8.9   GFR: Estimated Creatinine Clearance: 56.9 mL/min (by C-G formula based on SCr of 0.78 mg/dL). Liver Function Tests: Recent Labs  Lab 07/01/20 0246  AST 32  ALT 35  ALKPHOS 141*  BILITOT 0.7  PROT 5.4*  ALBUMIN 1.8*   No results for input(s): LIPASE, AMYLASE in the last 168 hours. No results for input(s): AMMONIA in the last 168 hours. Coagulation Profile: No results for input(s): INR, PROTIME in the last 168 hours. Cardiac Enzymes: No results for input(s): CKTOTAL, CKMB, CKMBINDEX, TROPONINI in the last 168 hours. BNP (last 3 results) No results for input(s): PROBNP in the last 8760 hours. HbA1C: No results for input(s): HGBA1C in the last 72 hours. CBG: No results for input(s): GLUCAP in the last 168 hours. Lipid Profile: No results for input(s): CHOL, HDL, LDLCALC, TRIG, CHOLHDL, LDLDIRECT in the last 72  hours. Thyroid Function Tests: No results for input(s): TSH, T4TOTAL, FREET4, T3FREE, THYROIDAB in the last 72 hours. Anemia Panel: No results for input(s): VITAMINB12, FOLATE, FERRITIN, TIBC, IRON, RETICCTPCT in the last 72 hours. Sepsis Labs: Recent Labs  Lab 06/30/20 0737 06/30/20 1114  LATICACIDVEN 1.2 1.3    Recent Results (from the past 240 hour(s))  Resp Panel by RT-PCR (Flu A&B, Covid) Nasopharyngeal  Swab     Status: None   Collection Time: 06/30/20  6:42 AM   Specimen: Nasopharyngeal Swab; Nasopharyngeal(NP) swabs in vial transport medium  Result Value Ref Range Status   SARS Coronavirus 2 by RT PCR NEGATIVE NEGATIVE Final    Comment: (NOTE) SARS-CoV-2 target nucleic acids are NOT DETECTED.  The SARS-CoV-2 RNA is generally detectable in upper respiratory specimens during the acute phase of infection. The lowest concentration of SARS-CoV-2 viral copies this assay can detect is 138 copies/mL. A negative result does not preclude SARS-Cov-2 infection and should not be used as the sole basis for treatment or other patient management decisions. A negative result may occur with  improper specimen collection/handling, submission of specimen other than nasopharyngeal swab, presence of viral mutation(s) within the areas targeted by this assay, and inadequate number of viral copies(<138 copies/mL). A negative result must be combined with clinical observations, patient history, and epidemiological information. The expected result is Negative.  Fact Sheet for Patients:  BloggerCourse.com  Fact Sheet for Healthcare Providers:  SeriousBroker.it  This test is no t yet approved or cleared by the [place] FDA and  has been authorized for detection and/or diagnosis of SARS-CoV-2 by FDA under an Emergency Use Authorization (EUA). This EUA will remain  in effect (meaning this test can be used) for the duration of the COVID-19  declaration under Section 564(b)(1) of the Act, 21 U.S.C.section 360bbb-3(b)(1), unless the authorization is terminated  or revoked sooner.       Influenza A by PCR NEGATIVE NEGATIVE Final   Influenza B by PCR NEGATIVE NEGATIVE Final    Comment: (NOTE) The Xpert Xpress SARS-CoV-2/FLU/RSV plus assay is intended as an aid in the diagnosis of influenza from Nasopharyngeal swab specimens and should not be used as a sole basis for treatment. Nasal washings and aspirates are unacceptable for Xpert Xpress SARS-CoV-2/FLU/RSV testing.  Fact Sheet for Patients: BloggerCourse.com  Fact Sheet for Healthcare Providers: SeriousBroker.it  This test is not yet approved or cleared by the [place] FDA and has been authorized for detection and/or diagnosis of SARS-CoV-2 by FDA under an Emergency Use Authorization (EUA). This EUA will remain in effect (meaning this test can be used) for the duration of the COVID-19 declaration under Section 564(b)(1) of the Act, 21 U.S.C. section 360bbb-3(b)(1), unless the authorization is terminated or revoked.  Performed at [facility], [address], [place], [place] [address]   Blood culture (routine x 2)     Status: None (Preliminary result)   Collection Time: 06/30/20  7:37 AM   Specimen: Left Antecubital; Blood  Result Value Ref Range Status   Specimen Description   Final    LEFT ANTECUBITAL Performed at [facility], [address], [place], [place] [address]    Special Requests   Final    Blood Culture adequate volume Performed at [facility], [address] [place], [place] [address]    Culture   Final    NO GROWTH < 24 HOURS Performed at [facility], [address], [place], [place] [address]    Report Status PENDING  Incomplete  Blood culture (routine x 2)     Status: None (Preliminary result)   Collection Time:  06/30/20  7:42 AM   Specimen: Right Antecubital; Blood  Result Value Ref Range Status   Specimen Description   Final    RIGHT ANTECUBITAL Performed at [facility], [address] [place], [place] [address]    Special  Requests   Final    Blood Culture adequate volume Performed at [facility], [address], [place], [place] [address]    Culture   Final    NO GROWTH < 24 HOURS Performed at [facility], [address], [place], [place] [address]    Report Status PENDING  Incomplete  Body fluid culture w Gram Stain     Status: None (Preliminary result)   Collection Time: 06/30/20 10:01 AM   Specimen: Pleural Fluid  Result Value Ref Range Status   Specimen Description   Final    PLEURAL Performed at [facility], [address] [place], [place] [address]    Special Requests   Final    NONE Performed at [facility], [address], [place], [place] [address]    Gram Stain   Final    ABUNDANT WBC PRESENT,BOTH PMN AND MONONUCLEAR ABUNDANT GRAM POSITIVE COCCI    Culture   Final    CULTURE REINCUBATED FOR BETTER GROWTH Performed at [facility], [address] [place], [place] [address]    Report Status PENDING  Incomplete  MRSA PCR Screening     Status: None   Collection Time: 06/30/20  4:17 PM   Specimen: Nasopharyngeal  Result Value Ref Range Status   MRSA by PCR NEGATIVE NEGATIVE Final    Comment:        The GeneXpert MRSA Assay (FDA approved for NASAL specimens only), is one component of a comprehensive MRSA colonization surveillance program. It is not intended to diagnose MRSA infection nor to guide or monitor treatment for MRSA infections. Performed at [facility], [address], [place], [place] [address]     RN Pressure Injury Documentation:     Estimated body mass index is 22.31 kg/m as calculated from the following:   Height as of this  encounter: 5\' 2"  (1.575 m).   Weight as of this encounter: 55.3 kg.  Malnutrition Type:  Nutrition Problem: Moderate Malnutrition Etiology: acute illness   Malnutrition Characteristics:  Signs/Symptoms: mild fat depletion, mild muscle depletion   Nutrition Interventions:  Interventions: Boost Breeze, MVI, Other (Comment) Farms 1.4)   Radiology Studies: CT Chest Wo Contrast  Result Date: 06/30/2020 CLINICAL DATA:  Fever, cough, shortness of breath for 8-10 days, LEFT chest and rib pain, chest x-ray with pleural effusion EXAM: CT CHEST WITHOUT CONTRAST TECHNIQUE: Multidetector CT imaging of the chest was performed following the standard protocol without IV contrast. Sagittal and coronal MPR images reconstructed from axial data set. COMPARISON:  None FINDINGS: Cardiovascular: Small pericardial effusion. Heart otherwise unremarkable. Aorta normal caliber. Minimal atherosclerotic calcification of aorta and coronary arteries. Mediastinum/Nodes: 11 mm LEFT thyroid nodule; Not clinically significant; no follow-up imaging recommended (ref: J Am Coll Radiol. 2015 Feb;12(2): 143-50).Esophagus unremarkable. Multiple though normal sized mediastinal lymph nodes. Mildly enlarged subcarinal lymph node 13 mm short axis image 56. Slight mediastinal shift LEFT to RIGHT. 7 mm inferior RIGHT cervical lymph node image 10. Lungs/Pleura: Subtotal opacification of the LEFT hemithorax by large pleural effusion. 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 Complete atelectasis of LEFT lower lobe with subtotal atelectasis of LEFT upper lobe. Occlusion of LEFT upper lobe bronchus at LEFT hilum. Marked narrowing of LEFT lower lobe bronchi. Findings suggest presence of tumor at the LEFT hilum though this is difficult to differentiate from adjacent atelectatic central lung and pleural based soft  tissue in LEFT hemithorax.  Upper Abdomen: Question small cyst at medial upper LEFT kidney 13 mm diameter. Abnormal soft tissue density in the anterolateral LEFT upper quadrant invading the abdominal wall 3.6 x 2.8 cm image 130 consistent with tumor. Musculoskeletal: No acute osseous findings. IMPRESSION: Subtotal opacification of the LEFT hemithorax by large pleural effusion with complete atelectasis of LEFT lower lobe and subtotal atelectasis of LEFT upper lobe. 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 Marked narrowing of LEFT lower lobe bronchi with occlusion of LEFT upper lobe bronchus at LEFT hilum suspicious for hilar tumor, difficult to delineate due to adjacent opacified lung and pleural based tumor. Abnormal soft tissue density in the anterolateral LEFT upper quadrant invading the abdominal wall 3.6 x 2.8 cm consistent with tumor. Mildly enlarged subcarinal lymph node. Small pericardial effusion. Aortic Atherosclerosis (ICD10-I70.0). Electronically Signed   By: Ulyses Southward M.D.   On: 06/30/2020 08:04   DG Chest Port 1 View  Result Date: 07/01/2020 CLINICAL DATA:  Status post chest tube placement EXAM: PORTABLE CHEST 1 VIEW COMPARISON:  Film from earlier in the same day. FINDINGS: Previously seen pigtail catheter has been removed and a large bore left-sided chest tube placed. Significant reduction in pleural effusion is seen. A small amount of air is noted within the pleural space related to the catheter placement. Right lung is clear. Patchy infiltrate in the left base is noted. Cardiac shadow is stable. IMPRESSION: Significant reduction in left-sided pleural effusion. Pleural margins are somewhat thickened which corresponds to the pleural density seen on recent CT examination. Left lower lobe infiltrate. Electronically Signed   By: Alcide Clever M.D.   On: 07/01/2020 12:39   DG CHEST PORT 1 VIEW  Result Date:  07/01/2020 CLINICAL DATA:  Empyema, chest tube EXAM: PORTABLE CHEST 1 VIEW COMPARISON:  CT 06/30/2020, radiograph 06/30/2020 FINDINGS: Left pigtail pleural catheter is noted with possible kinking at the insertion site along the lateral chest wall. Increasing size of a loculated left pleural effusion with extensive heterogeneous opacity throughout the residually aerated portions of the left lung likely reflecting combination of passive atelectatic change and possible underlying airspace disease. Right lung is predominantly clear. No right effusion. No visible pneumothorax. Telemetry leads and support devices overlie the chest. No acute osseous or soft tissue abnormality. IMPRESSION: Left pigtail catheter remains in place with some possible kinking at the insertion site along the left chest wall. Increasing size of the loculated left pleural effusion. Heterogeneous opacity within the residually aerated portions of the left lung likely reflect a combination of airspace disease and passive atelectasis. These results will be called to the ordering clinician or representative by the Radiologist Assistant, and communication documented in the PACS or [employer]. Electronically Signed   By: Kreg Shropshire M.D.   On: 07/01/2020 06:32   DG Chest Port 1 View  Result Date: 06/30/2020 CLINICAL DATA:  Status post chest tube placement. EXAM: PORTABLE CHEST 1 VIEW COMPARISON:  Chest radiograph and CT 06/30/2020 FINDINGS: A left-sided chest tube has been placed terminating over the mid lung. There is a moderate residual left pleural effusion which appears partially loculated. Pleural fluid has decreased significantly following chest tube placement with partial re-expansion of the left lung. There is abnormal density in the left hilar and infrahilar regions as noted on CT. The right lung remains clear. No pneumothorax is identified. Mild thoracic dextroscoliosis is noted. IMPRESSION: Interval chest tube placement with  decreased size of left pleural effusion and partial re-expansion of the left lung. Electronically Signed   By: Sebastian AcheAllen  Grady M.D.   On: 06/30/2020 10:37   DG Chest Portable 1 View  Result Date: 06/30/2020 CLINICAL DATA:  SOB EXAM: PORTABLE CHEST 1 VIEW COMPARISON:  None. FINDINGS: Obscured cardiomediastinal silhouette, mildly shifted towards the right. There is complete opacification of the left hemithorax. The right lung is clear. There is no visible pneumothorax. No acute osseous abnormality. IMPRESSION: Complete opacification of the left hemithorax, favored to be due to a large pleural effusion. Slight rightward mediastinal shift which can be seen with tension physiology. These results were called by telephone at the time of interpretation on 06/30/2020 at 7:13am to provider [facility] HORT[facility] , who verbally acknowledged these results. Electronically Signed   By: Caprice RenshawJacob  Kahn   On: 06/30/2020 07:15    Scheduled Meds:  chlorhexidine  15 mL Mouth Rinse BID   Chlorhexidine Gluconate Cloth  6 each Topical Daily   docusate  100 mg Per Tube BID   enoxaparin (LOVENOX) injection  40 mg Subcutaneous Q24H   [START ON 07/02/2020] feeding supplement  1 Container Oral Q24H   feeding supplement (KATE FARMS STANDARD 1.4)  325 mL Oral Daily   loratadine  10 mg Oral QHS   mouth rinse  15 mL Mouth Rinse q12n4p   multivitamin with minerals  1 tablet Oral Daily   polyethylene glycol  17 g Per Tube Daily   sodium chloride flush  10 mL Intracatheter Q8H   vitamin C  250 mg Oral Daily   Continuous Infusions:  sodium chloride     azithromycin Stopped (07/01/20 1505)   cefTRIAXone (ROCEPHIN)  IV Stopped (07/01/20 1129)   dexmedetomidine (PRECEDEX) IV infusion 0.4 mcg/kg/hr (07/01/20 1509)    LOS: 1 day   Merlene Laughtermair Latif Freedom Peddy, DO [facility] PAGER is on AMION  If 7PM-7AM, please contact night-coverage [email or web address]

## 2020-07-01 NOTE — Procedures (Signed)
Insertion of Chest Tube Procedure Note  Jamie Carr  [id number]  12-Nov-1955  Date:07/01/20  Time:3:08 PM    Provider Performing: Lorin Glass   Procedure: Chest Tube Insertion [phone])  Indication(s) Effusion  Consent Risks of the procedure as well as the alternatives and risks of each were explained to the patient and/or caregiver.  Consent for the procedure was obtained and is signed in the bedside chart  Anesthesia Ketamine, fentanyl   Time Out Verified patient identification, verified procedure, site/side was marked, verified correct patient position, special equipment/implants available, medications/allergies/relevant history reviewed, required imaging and test results available.   Sterile Technique Maximal sterile technique including full sterile barrier drape, hand hygiene, sterile gown, sterile gloves, mask, hair covering, sterile ultrasound probe cover (if used).   Procedure Description Cleaned draped existing pleural catheter; pulled back and got some flow of thick purulent fluid but not consistent so decision made to place larger tube after discussion with Dr. Marchelle Gearing.  Ultrasound used to identify appropriate pleural anatomy for placement and overlying skin marked. Area of placement cleaned and draped in sterile fashion.  A 32 French chest tube was placed into the left pleural space using Kelly dissection. Appropriate return of pus was obtained.  The tube was connected to atrium and placed on -20 cm H2O wall suction.   Complications/Tolerance Pain, tachycardia, see Cloyd Stagers  note Chest X-ray is ordered to verify placement.   EBL Minimal  Specimen(s) fluid

## 2020-07-01 NOTE — Progress Notes (Signed)
Initial Nutrition Assessment  DOCUMENTATION CODES:   Non-severe (moderate) malnutrition in context of acute illness/injury  INTERVENTION:  - will order Boost Breeze once/day, each supplement provides 250 kcal and 9 grams of protein. - will order Dillard [place] 1.4 oral once/day, each supplement provides 455 kcal and 20 grams protein. - will order 1 tablet multivitamin with minerals/day.   NUTRITION DIAGNOSIS:   Moderate Malnutrition related to acute illness as evidenced by mild fat depletion, mild muscle depletion.  GOAL:   Patient will meet greater than or equal to 90% of their needs  MONITOR:   PO intake, Supplement acceptance, Labs, Weight trends  REASON FOR ASSESSMENT:   Malnutrition Screening Tool  ASSESSMENT:   65 year old female with medical history of prior thyroid disease, tremors, and epilepsy. She presented to the ED due to 2-3 week hx of dyspnea, cough, and L-sided chest pain which have progressively worsened over time. In the ED she was noted to be tachypneic and CXR and CT chest showed complete opacification of L hemopneumothorax and showed a large L pleural effusion with concern for tumor.  Patient had chest tube placed late morning. She was laying in bed with no family or visitors present at the time of RD visit. Her husband visited this AM before he went to work.   Patient having some shortness of breath and still in pain at the time of RD visit. Precedex started toward the end of visit.   Patient had some ginger ale before chest tube placement but no breakfast. She did not feel up to eating lunch but was agreeable to having chicken broth ordered.   She mainly follows a gluten-free and dairy-free diet. With the onset of current symptoms she has been focusing on liquids rather than solid foods. She was eating non-dairy yogurt, drinking kombucha, and making immune-boosting smoothies using kombucha in place of non-dairy milk, and drinking herbal tea in the evenings.    She typically eats mainly vegetables but over the past few weeks has limited vegetables and carbohydrate-containing foods as they take too much effort to consume and she was having intermittent abdominal pain and nausea that were worsened by these items.   Patient does not have a scale at home but reports she lost a little bit of weight intentionally for her daughter's wedding in April. Over the past few weeks she has lost weight unintentionally, but is unsure of how much she may have lost.   Weight yesterday was 122 lb and PTA the most recently documented weight was on 09/04/13 when she weighed 130 lb.   Per notes: - large L-sided pleural effusion concerning for malignancy - L hilar mass and L pleural mass concerning for malignancy   Labs reviewed; Na: 132 mmol/l, BUN: 33 mg/dl, alk phos elevated.  Medications reviewed; 100 mg colace BID, 17 g miralax/day, 40 mEq Klor-Con x1 dose 6/15, 250 mg ascorbic acid/day.      NUTRITION - FOCUSED PHYSICAL EXAM:  Flowsheet Row Most Recent Value  Orbital Region Mild depletion  Upper Arm Region No depletion  Thoracic and Lumbar Region Unable to assess  Buccal Region Mild depletion  Temple Region No depletion  Clavicle Bone Region No depletion  Clavicle and Acromion Bone Region Mild depletion  Scapular Bone Region Unable to assess  Dorsal Hand Mild depletion  Patellar Region No depletion  Anterior Thigh Region Mild depletion  Posterior Calf Region Mild depletion  Edema (RD Assessment) None  Hair Reviewed  Eyes Reviewed  Mouth Reviewed  Skin Reviewed  Nails Reviewed       Diet Order:   Diet Order             Diet regular Room service appropriate? Yes; Fluid consistency: Thin  Diet effective now                   EDUCATION NEEDS:   No education needs have been identified at this time  Skin:  Skin Assessment: Reviewed RN Assessment  Last BM:  PTA/unknown  Height:   Ht Readings from Last 1 Encounters:  06/30/20 5' 2"   (1.575 m)    Weight:   Wt Readings from Last 1 Encounters:  06/30/20 55.3 kg      Estimated Nutritional Needs:  Kcal:  1700-1900 kcal Protein:  85-100 grams Fluid:  >/= 2 L/day     Jarome Matin, MS, RD, LDN, CNSC Inpatient Clinical Dietitian RD pager # available in AMION  After hours/weekend pager # available in [facility]

## 2020-07-01 NOTE — Progress Notes (Signed)
K+ 3.5  Replaced per protocol  

## 2020-07-02 ENCOUNTER — Inpatient Hospital Stay (HOSPITAL_COMMUNITY): Payer: BC Managed Care – PPO

## 2020-07-02 LAB — CBC WITH DIFFERENTIAL/PLATELET
Abs Immature Granulocytes: 0.96 10*3/uL — ABNORMAL HIGH (ref 0.00–0.07)
Basophils Absolute: 0.1 10*3/uL (ref 0.0–0.1)
Basophils Relative: 0 %
Eosinophils Absolute: 0 10*3/uL (ref 0.0–0.5)
Eosinophils Relative: 0 %
HCT: 30.7 % — ABNORMAL LOW (ref 36.0–46.0)
Hemoglobin: 10 g/dL — ABNORMAL LOW (ref 12.0–15.0)
Immature Granulocytes: 5 %
Lymphocytes Relative: 12 %
Lymphs Abs: 2.5 10*3/uL (ref 0.7–4.0)
MCH: 31.1 pg (ref 26.0–34.0)
MCHC: 32.6 g/dL (ref 30.0–36.0)
MCV: 95.3 fL (ref 80.0–100.0)
Monocytes Absolute: 1.4 10*3/uL — ABNORMAL HIGH (ref 0.1–1.0)
Monocytes Relative: 7 %
Neutro Abs: 16.6 10*3/uL — ABNORMAL HIGH (ref 1.7–7.7)
Neutrophils Relative %: 76 %
Platelets: 618 10*3/uL — ABNORMAL HIGH (ref 150–400)
RBC: 3.22 MIL/uL — ABNORMAL LOW (ref 3.87–5.11)
RDW: 14.2 % (ref 11.5–15.5)
WBC: 21.5 10*3/uL — ABNORMAL HIGH (ref 4.0–10.5)
nRBC: 0 % (ref 0.0–0.2)

## 2020-07-02 LAB — GLUCOSE, CAPILLARY
Glucose-Capillary: 63 mg/dL — ABNORMAL LOW (ref 70–99)
Glucose-Capillary: 70 mg/dL (ref 70–99)
Glucose-Capillary: 81 mg/dL (ref 70–99)
Glucose-Capillary: 92 mg/dL (ref 70–99)

## 2020-07-02 LAB — BODY FLUID CULTURE W GRAM STAIN

## 2020-07-02 LAB — FOLATE: Folate: 12 ng/mL (ref >5.9)

## 2020-07-02 LAB — COMPREHENSIVE METABOLIC PANEL
ALT: 33 U/L (ref 0–44)
AST: 27 U/L (ref 15–41)
Albumin: 1.8 g/dL — ABNORMAL LOW (ref 3.5–5.0)
Alkaline Phosphatase: 142 U/L — ABNORMAL HIGH (ref 38–126)
Anion gap: 8 (ref 5–15)
BUN: 21 mg/dL (ref 8–23)
CO2: 21 mmol/L — ABNORMAL LOW (ref 22–32)
Calcium: 9.3 mg/dL (ref 8.9–10.3)
Chloride: 103 mmol/L (ref 98–111)
Creatinine, Ser: 0.73 mg/dL (ref 0.44–1.00)
GFR, Estimated: >60 mL/min (ref >60)
Glucose, Bld: 79 mg/dL (ref 70–99)
Potassium: 4.4 mmol/L (ref 3.5–5.1)
Sodium: 132 mmol/L — ABNORMAL LOW (ref 135–145)
Total Bilirubin: 0.9 mg/dL (ref 0.3–1.2)
Total Protein: 5.8 g/dL — ABNORMAL LOW (ref 6.5–8.1)

## 2020-07-02 LAB — RETICULOCYTES
Immature Retic Fract: 20.8 % — ABNORMAL HIGH (ref 2.3–15.9)
RBC.: 3.23 MIL/uL — ABNORMAL LOW (ref 3.87–5.11)
Retic Count, Absolute: 56.2 10*3/uL (ref 19.0–186.0)
Retic Ct Pct: 1.7 % (ref 0.4–3.1)

## 2020-07-02 LAB — VITAMIN B12: Vitamin B-12: 926 pg/mL — ABNORMAL HIGH (ref 180–914)

## 2020-07-02 LAB — FERRITIN: Ferritin: 912 ng/mL — ABNORMAL HIGH (ref 11–307)

## 2020-07-02 LAB — IRON AND TIBC
Iron: 20 ug/dL — ABNORMAL LOW (ref 28–170)
Saturation Ratios: 11 % (ref 10.4–31.8)
TIBC: 183 ug/dL — ABNORMAL LOW (ref 250–450)
UIBC: 163 ug/dL

## 2020-07-02 LAB — PHOSPHORUS: Phosphorus: 3.7 mg/dL (ref 2.5–4.6)

## 2020-07-02 LAB — MAGNESIUM: Magnesium: 1.8 mg/dL (ref 1.7–2.4)

## 2020-07-02 MED ORDER — MAGNESIUM SULFATE 2 GM/50ML IV SOLN
2.0000 g | Freq: Once | INTRAVENOUS | Status: AC
  Administered 2020-07-02: 2 g via INTRAVENOUS
  Filled 2020-07-02: qty 50

## 2020-07-02 MED ORDER — SODIUM CHLORIDE 0.9 % IV SOLN
3.0000 g | Freq: Four times a day (QID) | INTRAVENOUS | Status: DC
  Administered 2020-07-03 – 2020-07-11 (×33): 3 g via INTRAVENOUS
  Filled 2020-07-02: qty 8
  Filled 2020-07-02 (×2): qty 3
  Filled 2020-07-02: qty 8
  Filled 2020-07-02: qty 3
  Filled 2020-07-02: qty 8
  Filled 2020-07-02 (×4): qty 3
  Filled 2020-07-02: qty 8
  Filled 2020-07-02 (×5): qty 3
  Filled 2020-07-02: qty 8
  Filled 2020-07-02: qty 3
  Filled 2020-07-02: qty 8
  Filled 2020-07-02 (×4): qty 3
  Filled 2020-07-02 (×2): qty 8
  Filled 2020-07-02: qty 3
  Filled 2020-07-02 (×2): qty 8
  Filled 2020-07-02 (×3): qty 3
  Filled 2020-07-02: qty 8
  Filled 2020-07-02: qty 3
  Filled 2020-07-02 (×2): qty 8

## 2020-07-02 NOTE — Progress Notes (Signed)
Report called to Jamie Carr. All questions answered at this time. All pt belongings and paper chart transported with pt. Pt was taken upstairs in the bed on tele by RN and NT. 4E will continue to care for pt.

## 2020-07-02 NOTE — Progress Notes (Signed)
[facility] ADULT ICU REPLACEMENT PROTOCOL   The patient does apply for the [facility] Adult ICU Electrolyte Replacment Protocol based on the criteria listed below:   1. Is GFR >/= 30 ml/min? Yes.    Patient's GFR today is >60 2. Is SCr </= 2? Yes.   Patient's SCr is .73 ml/kg/hr 3. Did SCr increase >/= 0.5 in 24 hours? No. 4. Abnormal electrolyte(s): mag 1.8 5. Ordered repletion with: see standing orders 6. If a panic level lab has been reported, has the CCM MD in charge been notified? No..     Melvern Banker 07/02/2020 4:02 AM

## 2020-07-02 NOTE — Progress Notes (Signed)
NAME:  Jamie Carr MRN:  [id number] DOB:  [birth year] LOS: 2 ADMISSION DATE:  06/30/2020 CONSULTATION DATE:  06/30/2020 REFERRING MD: Dr. Dina Rich - EDP CHIEF COMPLAINT:  SOB, Pleural effusion   History of Present Illness:  65 year old female with PMHx significant for epilepsy, thyroid disease and tremors who presented to [place] ED with complaints of dyspnea and left-sided chest pain x 2-3 weeks (worse with deep breathing and coughing, radiating to L shoulder). Dyspnea was progressive prompting her to seek medical attention 6/14.   In the ED, patient was tachypneic but no hypoxia was observed. CXR demonstrated complete opacification of the left hemithorax suggestive of large pleural effusion. CT Chest obtained to further characterize effusion showed large left-sided pleural effusion as well as concern for tumor at the left hilum and a pleural based mass.   PCCM asked to consult for effusion management.   Pertinent Medical History:  Epilepsy, thyroid disease, tremor, myopia, presbyopia  Significant Hospital Events: Including procedures, antibiotic start and stop dates in addition to other pertinent events   6/14 - Admit to [facility], transfer to [facility] consult for management of effusion, CT placed by EDP 6/15 - Increased pain at CT insertion site, CXR demonstrating possible kink at insertion site/L chest wall. Large-bore CT placed at bedside (Dr. Tamala Julian). Episode of respiratory distress with hypotension post-CT placement, thought to be med effect versus reexpansion pulm edema. Improved with Fentanyl/Precedex, weaned off of Precedex. BPs improved with fluid resuscitation. 6/16 - Improved CT drainage overnight, stable O2 needs. CXR with marginally increased PTX, decreased effusion, asymmetrical pulmonary edema on L.  Interim History / Subjective:  No significant events overnight Reports slightly more SOB when speaking to husband on phone this AM Feels better now, no dyspnea/CP, minimal  SOB Mild tolerable CT site pain Up to chair at time of rounds  Objective:  Blood pressure (!) 97/59, pulse 88, temperature 98 F (36.7 C), temperature source Oral, resp. rate (!) 27, height 5' 2"  (1.575 m), weight 55.3 kg, SpO2 99 %.        Intake/Output Summary (Last 24 hours) at 07/02/2020 0904 Last data filed at 07/02/2020 0836 Gross per 24 hour  Intake 1985.25 ml  Output 830 ml  Net 1155.25 ml    Filed Weights   06/30/20 0625  Weight: 55.3 kg   Physical Examination: General: Acutely ill-appearing middle-aged woman in NAD. HEENT: [place]/AT, anicteric sclera, PERRL, dry mucous membranes. Neuro: Awake, oriented x 4. Responds to verbal stimuli. Following commands consistently. Moves all 4 extremities spontaneously.  CV: RRR, no m/g/r. PULM: Breathing even and unlabored on 3LNC. Lung fields clear on R, diminished on L but improved from prior exam. GI: Soft, nontender, nondistended. Normoactive bowel sounds. Extremities: No LE edema noted. Skin: Warm/dry, no rashes.  Labs/imaging that I have personally reviewed: (right click and "Reselect all SmartList Selections" daily)   CXR 6/16 Chest tube on left, unchanged, with left-sided pneumothorax, marginally larger. No tension component. Airspace opacity consistent with pneumonia left base. Asymmetric pulmonary edema on the left is stable. Right lung clear. Stable cardiac silhouette.   WBC 31.5 (21.7), H&H 10.0/30.7, Plt 618  Na 132, K 4.4, CO2 21, BUN 21, Cr 0.73 Ca 9.3, Phos 3.7, Mg 1.8 AST 27, ALT 33, Alk Phos 142, Tbili 0.9  Pleural Fluid Cx 6/16 - abundant WBC, abundant gram positive cocci Pleural Fluid LDH 6/16 -  >10,000 Pleural Fluid Glucose 6/16 - 43 Pleural Fluid Cytology 6/15 - No malignant cells identified, acute inflammation  Resolved Hospital Problem List:     Assessment & Plan:  Large left sided pleural effusion, present on admission Based on CT findings, initial high suspicion for malignant effusion;  Cytology 6/15 from CT placement negative for malignant cells, demonstrated only acute inflammation. Issues with small-bore pigtail 6/15 prompted large-bore CT placement at bedside 6/16 with improved drainage of effusion. - S/p large-bore CT at bedside 6/16 - F/u pending pleural fluid studies - Continue empiric Azithromycin, Ceftriaxone, Vanc - Continue O2 via [place], wean as able for O2 sat > 90% - May need lytic administration, will evaluate day-to-day - Follow CXR  Left hilar mass Left pleural mass - F/u pleural fluid studies for guidance of further diagnosis and treatment - Repeat CT Chest (once effusion resolved) for better characterization of chest/possible masses - May need bronch/biopsy - Possible TCTS consult for VATS vs. Alternative pleural intervention if effusion does not resolve  Leukocytosis of uncertain etiology Infectious vs. malignancy - Workup per primary team - F/u pending BCx  Best Practice: (right click and "Reselect all SmartList Selections" daily)  Diet:  Oral Pain/Anxiety/Delirium protocol (if indicated): No VAP protocol (if indicated): Not indicated DVT prophylaxis: LMWH GI prophylaxis: N/A Glucose control:  SSI No Central venous access:  N/A Arterial line:  N/A Foley:  N/A Mobility:  OOB  PT consulted: Yes Last date of multidisciplinary goals of care discussion [Per primary team] Code Status:  full code Disposition: ICU  Critical care time: N/A   Rhae Lerner [facility] 07/02/20 9:04 AM  Please see [email or web address] for pager details.  From 7A-7P if no response, please call [phone] After hours, please call E-Link [phone]

## 2020-07-02 NOTE — Progress Notes (Signed)
PROGRESS NOTE    Jamie Carr  ION:[id number] DOB: [birth year] DOA: 06/30/2020 PCP: Henreitta Leber, PA-C   Brief Narrative:  The patient is a 65 year old thin Caucasian female with a past medical history significant for but not limited to prior thyroid disease and tremors as well as other comorbidities who presented to the ED with ongoing dyspnea for 2 to 3 weeks, cough and left-sided chest pain.  Her symptoms progressively started worsening and she was seen in the [facility] ER at [facility] and initial evaluation she is found to be tachypneic with a chest x-ray and chest CT noting complete opacification of the left hemipneumothorax.  CT noted a large left pleural effusion with concern for tumor at the hilum and pleural-based mass.  The case was discussed with pulmonary and the decision was made to transfer the patient to [facility] for further evaluation management.  In the ED she had a left-sided chest tube placed and 1 L was drained and this was and subsequently sent to the hospital.  Of note her Pleurx catheter stopped working and had to be exchanged out by pulmonary today and subsequently she was complaining of pain so she was placed on Precedex drip and became hypotensive.  Pulmonary is managing closely with a low threshold to start pressors subsequently improved and was weaned to room air.  Assessment & Plan:   Principal Problem:   Pleural effusion Active Problems:   Malnutrition of moderate degree  Acute Respiratory Failure with hypoxia in the setting of Large Left Sided pleural effusion with concern for underlying malignancy but now the setting of strep intermedius empyema, present on admission -Admitted to the SDU  -Initially suspected malignant pleural effusion however the pleural fluid analysis showed no malignant cells and did show inflammation -Cultures are growing strep intermedius -Patient had a chest tube placement yesterday in the ED however it failed because it failed to drain due to  likely blockage and kinking -Will follow up on her pleural fluid cell count, pH, protein, LDH and cytology cultures; pleural fluid color was obtained milky with 16,000 total nucleated cell count and turbid appearance -Pulmonary was consulted for further assistance and evaluation -Because of fevers and leukocytosis of empirically added IV ceftriaxone and azithromycin -Pulmonary Dr. Katrinka Blazing reevaluated and pulled initial chest tube and replaced it and now it is draining appropriately -WBC went from 29.3 -> 23.0 -> 21.7 and is further improved to 21.5 -Repeat CXR yesterday Afternoon showed "Significant reduction in left-sided pleural effusion. Pleural margins are somewhat thickened which corresponds to the pleural density seen on recent CT examination.  Left lower lobe infiltrate" -Repeat chest x-ray today shows possible reexpansion edema and a LI of the left lung and pneumothorax ex vacuo; her chest tube is still draining pus and Down but the cultures from it is growing Streptococcus intermedius which is pansensitive so she was de-escalated to IV Unasyn from IV ceftriaxone -SpO2: 95 % O2 Flow Rate (L/min): 1 L/min; now is off of supplemental oxygen via nasal cannula -Continuous pulse oximetry maintain O2 saturation greater 90% -Because of the patient discomfort she was placed on a Precedex drip by pulmonary and will continue per their recommendations -Continue supplemental oxygen via nasal cannula and wean O2 as tolerated; pulmonary recommending continue chest tube and wean FiO2 for pulse ox greater than 88 -Because of her empyema pulmonary recommending evaluating for head and neck origin especially all infections and they are getting an echocardiogram given the rare incidence of endocarditis but also the negative CT of  the chest to assess if she needs intrapleural lytics  Hypotension -In the setting of the Precedex drip, now improved -Pulmonary is providing boluses for the patient -Continue monitor  blood pressures per protocol -If necessary may need low-dose pressors -Blood pressure dropped to 80/40 yesterday afternoon but is now improved  Normocytic Anemia -Anemia panel was checked and showed an iron level of 20, U IBC 163, TIBC 183 saturation ratios of 11%, ferritin level 912, folate level 12.0 and vitamin B12 of 926 -Patient's Hgb/Hct went from 10.5/30.6 -> 10.6/31.6 -> 9.4/28.4 and is now -Continue to monitor for signs and symptoms of bleeding; currently no overt bleeding noted For repeat CBC in a.m.  Thrombocytosis -Likely Reactive from Above in setting of empyema -Patient's Platelet Count went from 635 -> 620 -> 571 and today 618 -Continue to Monitor and Trend -Repeat CBC in the AM  Hyponatremia -Patient's Na+ was 132 and is again 132 -Initially there was a concern for malignancy however now is in the setting of empyema -Continue to Monitor and Trend -Repeat CMP in the AM  Hypomagnesemia -Replete -Continue to Monitor and Replete as Necessary -Repeat CMP in the AM   Metabolic Acidosis -Mild. Patient's CO2 is now 21, AG is 8, and Chloride Level is 103 -Continue to Monitor and Trend -Repeat CMP in the AM   Abnormal EKG -Was concerning for pericardial effusion -Echocardiogram still pending to be done  Left hilar mass and left pleural soft tissue mass with concern for malignancy however this is now a Streptococcus intermedius empyema -She had a pending pleural fluid studies which we will need to follow cytology for -Pulmonary recommending repeating chest CT once effusion is resolved for better characterization of chest possible masses -Per pulmonary if pleural fluid remains unremarkable/indeterminate may need a bronchoscopy for biopsy  Non-Severe (Moderate) Malnutrition in the context of Acute Illness/Injury  -Nutritionist consulted for further evaluation and recommendations -The nutritionist team is recommending boost breeze once a day as well as [employer] 1.4 p.o.  once a day and a multivitamin with minerals daily  DVT prophylaxis: Enoxaparin 40 mg sq q24h Code Status: FULL CODE  Family Communication: No family present at bedside  Disposition Plan: Pending further clearance and evaluation by pulmonary; patient also need PT OT to further evaluate and treat  Status is: Inpatient  Remains inpatient appropriate because:Ongoing diagnostic testing needed not appropriate for outpatient work up, Unsafe d/c plan, IV treatments appropriate due to intensity of illness or inability to take PO, and Inpatient level of care appropriate due to severity of illness  Dispo: The patient is from: Home              Anticipated d/c is to: Home              Patient currently is not medically stable to d/c.   Difficult to place patient No  Consultants:  Pulmonary  Procedures: Chest Tube Insertion  Antimicrobials:  Anti-infectives (From admission, onward)    Start     Dose/Rate Route Frequency Ordered Stop   07/03/20 0600  Ampicillin-Sulbactam (UNASYN) 3 g in sodium chloride 0.9 % 100 mL IVPB        3 g 200 mL/hr over 30 Minutes Intravenous Every 6 hours 07/02/20 1409     07/01/20 1045  cefTRIAXone (ROCEPHIN) 2 g in sodium chloride 0.9 % 100 mL IVPB  Status:  Discontinued        2 g 200 mL/hr over 30 Minutes Intravenous Every 24 hours 07/01/20 0947  07/02/20 1409   07/01/20 1000  cefTRIAXone (ROCEPHIN) 1 g in sodium chloride 0.9 % 100 mL IVPB  Status:  Discontinued        1 g 200 mL/hr over 30 Minutes Intravenous Every 24 hours 06/30/20 1723 07/01/20 0947   07/01/20 1000  azithromycin (ZITHROMAX) 500 mg in sodium chloride 0.9 % 250 mL IVPB  Status:  Discontinued        500 mg 250 mL/hr over 60 Minutes Intravenous Every 24 hours 06/30/20 1723 07/02/20 1409   06/30/20 0700  vancomycin (VANCOCIN) IVPB 1000 mg/200 mL premix        1,000 mg 200 mL/hr over 60 Minutes Intravenous  Once 06/30/20 0655 06/30/20 1134   06/30/20 0700  cefTRIAXone (ROCEPHIN) 2 g in sodium  chloride 0.9 % 100 mL IVPB        2 g 200 mL/hr over 30 Minutes Intravenous  Once 06/30/20 0655 06/30/20 1017   06/30/20 0700  azithromycin (ZITHROMAX) 500 mg in sodium chloride 0.9 % 250 mL IVPB        500 mg 250 mL/hr over 60 Minutes Intravenous  Once 06/30/20 0655 06/30/20 1030        Subjective: Seen and examined at bedside and was still having some pain on the left side of her chest.  No nausea or vomiting.  Feels a little more dyspneic today than she did yesterday.  No other concerns or complaints at this time.  Objective: Vitals:   07/02/20 1600 07/02/20 1700 07/02/20 1800 07/02/20 1812  BP: (!) 110/55 (!) 117/59 129/69 108/69  Pulse: 95 94 99 (!) 101  Resp: (!) 30 (!) 23 (!) 23 18  Temp:    98.4 F (36.9 C)  TempSrc:    Oral  SpO2: 98% 98% 98% 95%  Weight:    58 kg  Height:        Intake/Output Summary (Last 24 hours) at 07/02/2020 1856 Last data filed at 07/02/2020 1741 Gross per 24 hour  Intake 1347.39 ml  Output 400 ml  Net 947.39 ml    Filed Weights   06/30/20 0625 07/02/20 1812  Weight: 55.3 kg 58 kg   Examination: Physical Exam:  Constitutional: Thin cachectic Caucasian female currently in no acute distress appears calmer than yesterday but still does appear little uncomfortable Eyes: Lids and conjunctivae normal, sclerae anicteric  ENMT: External Ears, Nose appear normal. Grossly normal hearing.  Neck: Appears normal, supple, no cervical masses, normal ROM, no appreciable thyromegaly; no JVD Respiratory: Diminished to auscultation bilaterally with coarse breath sounds on the left compared to the right, no wheezing, rales, rhonchi or crackles.  Wearing supplemental oxygen via nasal cannula and has a left-sided chest tube in place Cardiovascular: RRR, no murmurs / rubs / gallops. S1 and S2 auscultated. No extremity edema.  Abdomen: Soft, non-tender, non-distended. Bowel sounds positive.  GU: Deferred. Musculoskeletal: No clubbing / cyanosis of  digits/nails. No joint deformity upper and lower extremities.  Skin: No rashes, lesions, ulcers on limited skin evaluation. No induration; Warm and dry.  Neurologic: CN 2-12 grossly intact with no focal deficits.  Romberg sign and cerebellar reflexes not assessed.  Psychiatric: Normal judgment and insight. Alert and oriented x 3. Normal mood and appropriate affect.   Data Reviewed: I have personally reviewed following labs and imaging studies  CBC: Recent Labs  Lab 06/30/20 0625 06/30/20 1922 07/01/20 0246 07/02/20 0257  WBC 29.3* 23.0* 21.7* 21.5*  NEUTROABS 24.7*  --   --  16.6*  HGB  10.5* 10.6* 9.4* 10.0*  HCT 30.6* 31.6* 28.4* 30.7*  MCV 91.1 93.2 93.7 95.3  PLT 635* 620* 571* 618*    Basic Metabolic Panel: Recent Labs  Lab 06/30/20 0625 06/30/20 1922 07/01/20 0246 07/02/20 0257  NA 132*  --  132* 132*  K 3.7  --  3.5 4.4  CL 99  --  103 103  CO2 20*  --  21* 21*  GLUCOSE 118*  --  106* 79  BUN 36*  --  33* 21  CREATININE 0.88 0.90 0.78 0.73  CALCIUM 10.7*  --  8.9 9.3  MG  --   --   --  1.8  PHOS  --   --   --  3.7    GFR: Estimated Creatinine Clearance: 56.9 mL/min (by C-G formula based on SCr of 0.73 mg/dL). Liver Function Tests: Recent Labs  Lab 07/01/20 0246 07/02/20 0257  AST 32 27  ALT 35 33  ALKPHOS 141* 142*  BILITOT 0.7 0.9  PROT 5.4* 5.8*  ALBUMIN 1.8* 1.8*    No results for input(s): LIPASE, AMYLASE in the last 168 hours. No results for input(s): AMMONIA in the last 168 hours. Coagulation Profile: No results for input(s): INR, PROTIME in the last 168 hours. Cardiac Enzymes: No results for input(s): CKTOTAL, CKMB, CKMBINDEX, TROPONINI in the last 168 hours. BNP (last 3 results) No results for input(s): PROBNP in the last 8760 hours. HbA1C: No results for input(s): HGBA1C in the last 72 hours. CBG: Recent Labs  Lab 07/02/20 1114 07/02/20 1701 07/02/20 1752  GLUCAP 92 63* 70   Lipid Profile: No results for input(s): CHOL, HDL,  LDLCALC, TRIG, CHOLHDL, LDLDIRECT in the last 72 hours. Thyroid Function Tests: No results for input(s): TSH, T4TOTAL, FREET4, T3FREE, THYROIDAB in the last 72 hours. Anemia Panel: Recent Labs    07/02/20 0257  VITAMINB12 926*  FOLATE 12.0  FERRITIN 912*  TIBC 183*  IRON 20*  RETICCTPCT 1.7   Sepsis Labs: Recent Labs  Lab 06/30/20 0737 06/30/20 1114  LATICACIDVEN 1.2 1.3     Recent Results (from the past 240 hour(s))  Resp Panel by RT-PCR (Flu A&B, Covid) Nasopharyngeal Swab     Status: None   Collection Time: 06/30/20  6:42 AM   Specimen: Nasopharyngeal Swab; Nasopharyngeal(NP) swabs in vial transport medium  Result Value Ref Range Status   SARS Coronavirus 2 by RT PCR NEGATIVE NEGATIVE Final    Comment: (NOTE) SARS-CoV-2 target nucleic acids are NOT DETECTED.  The SARS-CoV-2 RNA is generally detectable in upper respiratory specimens during the acute phase of infection. The lowest concentration of SARS-CoV-2 viral copies this assay can detect is 138 copies/mL. A negative result does not preclude SARS-Cov-2 infection and should not be used as the sole basis for treatment or other patient management decisions. A negative result may occur with  improper specimen collection/handling, submission of specimen other than nasopharyngeal swab, presence of viral mutation(s) within the areas targeted by this assay, and inadequate number of viral copies(<138 copies/mL). A negative result must be combined with clinical observations, patient history, and epidemiological information. The expected result is Negative.  Fact Sheet for Patients:  BloggerCourse.com  Fact Sheet for Healthcare Providers:  SeriousBroker.it  This test is no t yet approved or cleared by the [place] FDA and  has been authorized for detection and/or diagnosis of SARS-CoV-2 by FDA under an Emergency Use Authorization (EUA). This EUA will remain  in  effect (meaning this test can be used) for  the duration of the COVID-19 declaration under Section 564(b)(1) of the Act, 21 U.S.C.section 360bbb-3(b)(1), unless the authorization is terminated  or revoked sooner.       Influenza A by PCR NEGATIVE NEGATIVE Final   Influenza B by PCR NEGATIVE NEGATIVE Final    Comment: (NOTE) The Xpert Xpress SARS-CoV-2/FLU/RSV plus assay is intended as an aid in the diagnosis of influenza from Nasopharyngeal swab specimens and should not be used as a sole basis for treatment. Nasal washings and aspirates are unacceptable for Xpert Xpress SARS-CoV-2/FLU/RSV testing.  Fact Sheet for Patients: BloggerCourse.com  Fact Sheet for Healthcare Providers: SeriousBroker.it  This test is not yet approved or cleared by the [place] FDA and has been authorized for detection and/or diagnosis of SARS-CoV-2 by FDA under an Emergency Use Authorization (EUA). This EUA will remain in effect (meaning this test can be used) for the duration of the COVID-19 declaration under Section 564(b)(1) of the Act, 21 U.S.C. section 360bbb-3(b)(1), unless the authorization is terminated or revoked.  Performed at [facility], [address] [place], [place] [address]   Blood culture (routine x 2)     Status: None (Preliminary result)   Collection Time: 06/30/20  7:37 AM   Specimen: Left Antecubital; Blood  Result Value Ref Range Status   Specimen Description   Final    LEFT ANTECUBITAL Performed at [facility], [address] [place], [place] [address]    Special Requests   Final    Blood Culture adequate volume Performed at [facility], [address] [place], [place] [address]    Culture   Final    NO GROWTH 2 DAYS Performed at [facility], [address] [place], [place] [address]    Report Status PENDING  Incomplete  Blood culture (routine  x 2)     Status: None (Preliminary result)   Collection Time: 06/30/20  7:42 AM   Specimen: Right Antecubital; Blood  Result Value Ref Range Status   Specimen Description   Final    RIGHT ANTECUBITAL Performed at [facility], [address], [place], [place] [address]    Special Requests   Final    Blood Culture adequate volume Performed at [facility], [address], [place], [place] [address]    Culture   Final    NO GROWTH 2 DAYS Performed at [facility], [address], [place], [place] [address]    Report Status PENDING  Incomplete  Body fluid culture w Gram Stain     Status: None   Collection Time: 06/30/20 10:01 AM   Specimen: Pleural Fluid  Result Value Ref Range Status   Specimen Description   Final    PLEURAL Performed at [facility], [address] [place], [place] [address]    Special Requests   Final    NONE Performed at [facility], [address] [place], [place] [address]    Gram Stain   Final    ABUNDANT WBC PRESENT,BOTH PMN AND MONONUCLEAR ABUNDANT GRAM POSITIVE COCCI Performed at [facility], [address] [place], [place] [address]    Culture ABUNDANT STREPTOCOCCUS INTERMEDIUS  Final   Report Status 07/02/2020 FINAL  Final   Organism ID, Bacteria STREPTOCOCCUS INTERMEDIUS  Final      Susceptibility   Streptococcus intermedius - MIC*    PENICILLIN <=0.06 SENSITIVE Sensitive     CEFTRIAXONE 0.25 SENSITIVE Sensitive     ERYTHROMYCIN <=0.12  SENSITIVE Sensitive     LEVOFLOXACIN 0.5 SENSITIVE Sensitive     VANCOMYCIN 0.5 SENSITIVE Sensitive     * ABUNDANT STREPTOCOCCUS INTERMEDIUS  MRSA PCR Screening     Status: None   Collection Time: 06/30/20  4:17 PM   Specimen: Nasopharyngeal  Result Value Ref Range Status   MRSA by PCR NEGATIVE NEGATIVE Final    Comment:        The GeneXpert MRSA Assay (FDA approved for NASAL specimens only), is one component  of a comprehensive MRSA colonization surveillance program. It is not intended to diagnose MRSA infection nor to guide or monitor treatment for MRSA infections. Performed at [facility], [address] [place], [place] [address]   Body fluid culture w Gram Stain     Status: None (Preliminary result)   Collection Time: 07/01/20 12:30 PM   Specimen: Pleura; Body Fluid  Result Value Ref Range Status   Specimen Description   Final    PLEURAL LT Performed at [facility], [address] [place], [place] [address]    Special Requests   Final    NONE Performed at [facility], [address] [place], [place] [address]    Gram Stain   Final    ABUNDANT WBC PRESENT,BOTH PMN AND MONONUCLEAR ABUNDANT GRAM POSITIVE COCCI    Culture   Final    CULTURE REINCUBATED FOR BETTER GROWTH Performed at [facility], [address] [place], [place] [address]    Report Status PENDING  Incomplete     RN Pressure Injury Documentation:     Estimated body mass index is 23.39 kg/m as calculated from the following:   Height as of this encounter:  (1.575 m).   Weight as of this encounter: 58 kg.  Malnutrition Type:  Nutrition Problem: Moderate Malnutrition Etiology: acute illness   Malnutrition Characteristics:  Signs/Symptoms: mild fat depletion, mild muscle depletion   Nutrition Interventions:  Interventions: Boost Breeze, MVI, Other (Comment) [employer] 1.4)   Radiology Studies: DG CHEST PORT 1 VIEW  Result Date: 07/02/2020 CLINICAL DATA:  Shortness of breath EXAM: PORTABLE CHEST 1 VIEW COMPARISON:  July 01, 2020 FINDINGS: Chest tube present on the left with tip directed medially and superiorly, stable. There is a persistent lateral and lateral basilar pneumothorax on the left, marginally larger than 1 day prior. Mild subcutaneous air also noted on the left. There is airspace opacity in the left base with  asymmetric pulmonary edema on the left, stable. Right lung is clear. Heart size and pulmonary vascularity are normal. No adenopathy. No bone lesions. IMPRESSION: Chest tube on left, unchanged, with left-sided pneumothorax, marginally larger. No tension component. Airspace opacity consistent with pneumonia left base. Asymmetric pulmonary edema on the left is stable. Right lung clear. Stable cardiac silhouette. Electronically Signed   By: Bretta Bang III M.D.   On: 07/02/2020 07:54   DG Chest Port 1 View  Result Date: 07/01/2020 CLINICAL DATA:  Status post chest tube placement EXAM: PORTABLE CHEST 1 VIEW COMPARISON:  Film from earlier in the same day. FINDINGS: Previously seen pigtail catheter has been removed and a large bore left-sided chest tube placed. Significant reduction in pleural effusion is seen. A small amount of air is noted within the pleural space related to the catheter placement. Right lung is clear. Patchy infiltrate in the left base is noted. Cardiac shadow is stable. IMPRESSION: Significant reduction in left-sided pleural effusion. Pleural margins are somewhat thickened which  corresponds to the pleural density seen on recent CT examination. Left lower lobe infiltrate. Electronically Signed   By: Alcide Clever M.D.   On: 07/01/2020 12:39   DG CHEST PORT 1 VIEW  Result Date: 07/01/2020 CLINICAL DATA:  Empyema, chest tube EXAM: PORTABLE CHEST 1 VIEW COMPARISON:  CT 06/30/2020, radiograph 06/30/2020 FINDINGS: Left pigtail pleural catheter is noted with possible kinking at the insertion site along the lateral chest wall. Increasing size of a loculated left pleural effusion with extensive heterogeneous opacity throughout the residually aerated portions of the left lung likely reflecting combination of passive atelectatic change and possible underlying airspace disease. Right lung is predominantly clear. No right effusion. No visible pneumothorax. Telemetry leads and support devices overlie  the chest. No acute osseous or soft tissue abnormality. IMPRESSION: Left pigtail catheter remains in place with some possible kinking at the insertion site along the left chest wall. Increasing size of the loculated left pleural effusion. Heterogeneous opacity within the residually aerated portions of the left lung likely reflect a combination of airspace disease and passive atelectasis. These results will be called to the ordering clinician or representative by the Radiologist Assistant, and communication documented in the PACS or [employer]. Electronically Signed   By: Kreg Shropshire M.D.   On: 07/01/2020 06:32    Scheduled Meds:  chlorhexidine  15 mL Mouth Rinse BID   Chlorhexidine Gluconate Cloth  6 each Topical Daily   docusate  100 mg Per Tube BID   enoxaparin (LOVENOX) injection  40 mg Subcutaneous Q24H   feeding supplement  1 Container Oral Q24H   feeding supplement ([employer] STANDARD 1.4)  325 mL Oral Daily   loratadine  10 mg Oral QHS   mouth rinse  15 mL Mouth Rinse q12n4p   multivitamin with minerals  1 tablet Oral Daily   polyethylene glycol  17 g Per Tube Daily   sodium chloride flush  10 mL Intracatheter Q8H   vitamin C  250 mg Oral Daily   Continuous Infusions:  sodium chloride     [START ON 07/03/2020] ampicillin-sulbactam (UNASYN) IV      LOS: 2 days   Merlene Laughter, DO [facility] PAGER is on AMION  If 7PM-7AM, please contact night-coverage [email or web address]

## 2020-07-02 NOTE — Progress Notes (Signed)
Patent stable on RA On chair Listening to book [place] ct chest  Plan  D/w Dr Marland Mcalpine - can go to tele    SIGNATURE    Dr. Kalman Shan, M.D., F.C.C.P,  Pulmonary and Critical Care Medicine Staff Physician, [facility] Director - Interstitial Lung Disease  Program  Pulmonary Fibrosis [facility] at [facility] [place], [place], [address]  Pager: [phone], If no answer  OR between  19:00-7:00h: page [phone] Telephone (clinical office): [phone] Telephone (research): [phone]  5:11 PM 07/02/2020

## 2020-07-02 NOTE — Progress Notes (Signed)
CBG was 63 at 1701. Pt refused to drink OJ per protocol saying it was "too sweet'. Pt requested italian ice instead. Rechecked, CBG was 70. Pt also promised to drink scheduled protein drink when sent by pharmacy.

## 2020-07-03 ENCOUNTER — Inpatient Hospital Stay (HOSPITAL_COMMUNITY): Payer: BC Managed Care – PPO

## 2020-07-03 ENCOUNTER — Encounter (HOSPITAL_COMMUNITY): Payer: Self-pay | Admitting: Internal Medicine

## 2020-07-03 DIAGNOSIS — R0609 Other forms of dyspnea: Secondary | ICD-10-CM | POA: Diagnosis not present

## 2020-07-03 DIAGNOSIS — J869 Pyothorax without fistula: Secondary | ICD-10-CM | POA: Diagnosis present

## 2020-07-03 LAB — BODY FLUID CULTURE W GRAM STAIN

## 2020-07-03 LAB — COMPREHENSIVE METABOLIC PANEL
ALT: 34 U/L (ref 0–44)
AST: 39 U/L (ref 15–41)
Albumin: 1.7 g/dL — ABNORMAL LOW (ref 3.5–5.0)
Alkaline Phosphatase: 119 U/L (ref 38–126)
Anion gap: 8 (ref 5–15)
BUN: 13 mg/dL (ref 8–23)
CO2: 24 mmol/L (ref 22–32)
Calcium: 9 mg/dL (ref 8.9–10.3)
Chloride: 103 mmol/L (ref 98–111)
Creatinine, Ser: 0.68 mg/dL (ref 0.44–1.00)
GFR, Estimated: >60 mL/min (ref >60)
Glucose, Bld: 96 mg/dL (ref 70–99)
Potassium: 4 mmol/L (ref 3.5–5.1)
Sodium: 135 mmol/L (ref 135–145)
Total Bilirubin: 0.6 mg/dL (ref 0.3–1.2)
Total Protein: 5.4 g/dL — ABNORMAL LOW (ref 6.5–8.1)

## 2020-07-03 LAB — CBC WITH DIFFERENTIAL/PLATELET
Abs Immature Granulocytes: 0.81 10*3/uL — ABNORMAL HIGH (ref 0.00–0.07)
Basophils Absolute: 0.1 10*3/uL (ref 0.0–0.1)
Basophils Relative: 1 %
Eosinophils Absolute: 0 10*3/uL (ref 0.0–0.5)
Eosinophils Relative: 0 %
HCT: 30.3 % — ABNORMAL LOW (ref 36.0–46.0)
Hemoglobin: 9.8 g/dL — ABNORMAL LOW (ref 12.0–15.0)
Immature Granulocytes: 6 %
Lymphocytes Relative: 12 %
Lymphs Abs: 1.7 10*3/uL (ref 0.7–4.0)
MCH: 30.8 pg (ref 26.0–34.0)
MCHC: 32.3 g/dL (ref 30.0–36.0)
MCV: 95.3 fL (ref 80.0–100.0)
Monocytes Absolute: 0.9 10*3/uL (ref 0.1–1.0)
Monocytes Relative: 6 %
Neutro Abs: 10.2 10*3/uL — ABNORMAL HIGH (ref 1.7–7.7)
Neutrophils Relative %: 75 %
Platelets: 627 10*3/uL — ABNORMAL HIGH (ref 150–400)
RBC: 3.18 MIL/uL — ABNORMAL LOW (ref 3.87–5.11)
RDW: 14 % (ref 11.5–15.5)
WBC: 13.7 10*3/uL — ABNORMAL HIGH (ref 4.0–10.5)
nRBC: 0 % (ref 0.0–0.2)

## 2020-07-03 LAB — ECHOCARDIOGRAM COMPLETE
Height: 62 in
S' Lateral: 2.6 cm
Weight: 2045.87 oz

## 2020-07-03 LAB — MAGNESIUM: Magnesium: 1.9 mg/dL (ref 1.7–2.4)

## 2020-07-03 LAB — CHOLESTEROL, BODY FLUID: Cholesterol, Fluid: 20 mg/dL

## 2020-07-03 LAB — PHOSPHORUS: Phosphorus: 3.6 mg/dL (ref 2.5–4.6)

## 2020-07-03 LAB — GLUCOSE, CAPILLARY
Glucose-Capillary: 77 mg/dL (ref 70–99)
Glucose-Capillary: 120 mg/dL — ABNORMAL HIGH (ref 70–99)
Glucose-Capillary: 89 mg/dL (ref 70–99)
Glucose-Capillary: 93 mg/dL (ref 70–99)

## 2020-07-03 MED ORDER — FENTANYL CITRATE (PF) 100 MCG/2ML IJ SOLN
INTRAMUSCULAR | Status: AC
  Filled 2020-07-03: qty 2

## 2020-07-03 MED ORDER — MIDAZOLAM HCL 2 MG/2ML IJ SOLN
INTRAMUSCULAR | Status: AC | PRN
  Administered 2020-07-03 (×2): 1 mg via INTRAVENOUS

## 2020-07-03 MED ORDER — MAGNESIUM SULFATE 2 GM/50ML IV SOLN
2.0000 g | Freq: Once | INTRAVENOUS | Status: AC
  Administered 2020-07-03: 2 g via INTRAVENOUS
  Filled 2020-07-03: qty 50

## 2020-07-03 MED ORDER — MIDAZOLAM HCL 2 MG/2ML IJ SOLN
INTRAMUSCULAR | Status: AC
  Filled 2020-07-03: qty 4

## 2020-07-03 MED ORDER — LIDOCAINE HCL (PF) 1 % IJ SOLN
INTRAMUSCULAR | Status: AC | PRN
  Administered 2020-07-03: 5 mL

## 2020-07-03 MED ORDER — FENTANYL CITRATE (PF) 100 MCG/2ML IJ SOLN
INTRAMUSCULAR | Status: AC | PRN
  Administered 2020-07-03 (×2): 50 ug via INTRAVENOUS

## 2020-07-03 NOTE — Progress Notes (Signed)
  Echocardiogram 2D Echocardiogram has been attempted Patient OTF. Will reattempt at later time.  Yarenis Cerino G Marialuiza Car 07/03/2020, 8:09 AM

## 2020-07-03 NOTE — Progress Notes (Signed)
NAME:  Jamie Carr MRN:  [id number] DOB:  01-May-1955 LOS: 3 ADMISSION DATE:  06/30/2020 CONSULTATION DATE:  06/30/2020 REFERRING MD: Dr. Dina Rich - EDP CHIEF COMPLAINT:  SOB, Pleural effusion   History of Present Illness:  65 year old female with PMHx significant for epilepsy, thyroid disease and tremors who presented to [place] ED with complaints of dyspnea and left-sided chest pain x 2-3 weeks (worse with deep breathing and coughing, radiating to L shoulder). Dyspnea was progressive prompting her to seek medical attention 6/14.   In the ED, patient was tachypneic but no hypoxia was observed. CXR demonstrated complete opacification of the left hemithorax suggestive of large pleural effusion. CT Chest obtained to further characterize effusion showed large left-sided pleural effusion as well as concern for tumor at the left hilum and a pleural based mass.   PCCM asked to consult for effusion management.   Pertinent Medical History:  Epilepsy, thyroid disease, tremor, myopia, presbyopia  Significant Hospital Events: Including procedures, antibiotic start and stop dates in addition to other pertinent events   6/14 - Admit to [facility], transfer to [facility], [facility] consult for management of effusion, CT placed by EDP 6/15 - Increased pain at CT insertion site, CXR demonstrating possible kink at insertion site/L chest wall. Large-bore CT placed at bedside (Dr. Tamala Julian). Episode of respiratory distress with hypotension post-CT placement, thought to be med effect versus reexpansion pulm edema. Improved with Fentanyl/Precedex, weaned off of Precedex. BPs improved with fluid resuscitation. 6/16 - Improved CT drainage overnight, stable O2 needs. CXR with marginally increased PTX, decreased effusion, asymmetrical pulmonary edema on L. Transferred to floor. 6/17 - Improved respiratory status and O2 needs. CT Chest demonstrating 3.5 x 4cm loculated collection vs. mass. IR to attempt aspiration today  Interim  History / Subjective:  No significant events overnight Continues to feel better Improved breathing, more comfortable Endorses mild SOB/CP associated with CT Denies fever/chills, n/v/d Awaiting IR procedure  Objective:  Blood pressure 124/66, pulse 91, temperature 98.1 F (36.7 C), temperature source Oral, resp. rate (!) 24, height _0  (1.575 m), weight 58 kg, SpO2 98 %.        Intake/Output Summary (Last 24 hours) at 07/03/2020 1528 Last data filed at 07/03/2020 1311 Gross per 24 hour  Intake 650 ml  Output 1160 ml  Net -510 ml    Filed Weights   06/30/20 0625 07/02/20 1812  Weight: 55.3 kg 58 kg   Physical Examination: General: Acutely ill-appearing middle-aged woman in NAD. HEENT: [place]/AT, anicteric sclera, PERRL, slightly dry mucous membranes. Neuro: Awake, oriented x 4. Responds to verbal stimuli. Following commands consistently. Moves all 4 extremities spontaneously. Strength 4/5 in all extremities. CV: RRR, no m/g/r. PULM: Breathing even and unlabored on 2LNC. Lung fields with improved air movement, still with diminished breath sounds on L and occasional crackles. CT in place with purulent drainage. GI: Soft, nontender, nondistended. Normoactive bowel sounds. Extremities: No LE edema noted. Skin: Warm/dry, no rashes.  Labs/imaging that I have personally reviewed: (right click and "Reselect all SmartList Selections" daily)   CXR 6/16 Chest tube on left, unchanged, with left-sided pneumothorax, marginally larger. No tension component. Airspace opacity consistent with pneumonia left base. Asymmetric pulmonary edema on the left is stable. Right lung clear. Stable cardiac silhouette.  CXR 6/17 1. Empyema with stable left chest tube. Poor re-expansion of the left lung which might be related to pneumonia. Small volume left pneumothorax/pleural gas superimposed on diffuse pleural thickening. 2. Right lung remains clear.  CT Chest  6/17 IMPRESSION: 1. Left lateral  approach thoracostomy tube with tip located in the major fissure, with a small moderate left pneumothorax. There is extensive nodular thickening of the parietal and visceral pleura throughout the left hemithorax with incomplete re-expansion of the left lung suggestive of the entrapment. 2. Similar appearance of the mass like area along the left anterior abdominal measuring 4 cm which is favored to represent loculated fluid along the costophrenic angle as opposed to a discrete chest wall mass. 3. Multifocal consolidations, nodularity and ground-glass opacities with septal thickening throughout the left lung. 4. Prominent to mildly enlarged lower cervical, mediastinal, and left axillary lymph nodes are again visualized and appears stable. 5. Trace upper abdominal, predominantly perihepatic, free fluid. 6. Diffuse subcutaneous edema with left chest wall subcutaneous emphysema. 7. Aortic atherosclerosis.  WBC 13.7 (21.5), H&H 9.8/30.3, Plt 627 (618)  Na 135, K 4.0, CO2 24, BUN 13, Cr 0.68 (0.73) Ca 9.0, Phos 3.6, Mg 1.9 AST 39, ALT 34, Alk Phos 119, Tbili 0.6  Pleural Fluid Cx 6/16 - abundant WBC, abundant gram positive cocci -> strep intermedius Pleural Fluid LDH 6/16 -  >10,000 Pleural Fluid Glucose 6/16 - 43 Pleural Fluid Cytology 6/15 - No malignant cells identified, acute inflammation  Resolved Hospital Problem List:     Assessment & Plan:  Large left sided pleural effusion, present on admission Streptococcus intermedius empyema Based on CT findings, initial high suspicion for malignant effusion; Cytology 6/15 from CT placement negative for malignant cells, demonstrated only acute inflammation. Issues with small-bore pigtail 6/15 prompted large-bore CT placement at bedside 6/16 with improved drainage of effusion. Cultures/fluid studies consistent with empyema. - S/p large bore CT placement 6/16 - Wean O2 as able for sat > 90% - Pleural fluid Cx + S. Intermedius - Continue  Unasyn - No role for lytics at this juncture - CT Chest demonstrating up to 4cm "mass" vs. Loculated fluid in anterior abdominal wall, query communication with empyema vs. Isolated abscess with seeding - IR consulted for aspiration attempt 6/17  Left hilar mass Left pleural mass - F/u finalized pleural studies/cultures - Repeat CT Chest as above - F/u IR aspiration vs. biopsy 6/17 - May need additional bronch/biopsy - Possible TCTS consult for VATS vs. alternative pleural intervention if empyema does not resolve  Leukocytosis of uncertain etiology Infectious vs. malignancy - Improving - F/u pending Cx  Best Practice: (right click and "Reselect all SmartList Selections" daily)  Diet:  Oral Pain/Anxiety/Delirium protocol (if indicated): No VAP protocol (if indicated): Not indicated DVT prophylaxis: LMWH - held for IR procedure GI prophylaxis: N/A Glucose control:  SSI No Central venous access:  N/A Arterial line:  N/A Foley:  N/A Mobility:  OOB  PT consulted: Yes Last date of multidisciplinary goals of care discussion [Per primary team] Code Status:  full code Disposition: Telemetry  Critical care time: N/A   Rhae Lerner [facility] 07/03/20 3:28 PM  Please see [email or web address] for pager details.  From 7A-7P if no response, please call [phone] After hours, please call E-Link [phone]

## 2020-07-03 NOTE — Progress Notes (Signed)
  Echocardiogram 2D Echocardiogram has been performed.  Jamie Carr 07/03/2020, 12:19 PM

## 2020-07-03 NOTE — Progress Notes (Signed)
[facility]    Date of Admission:  06/30/2020   Total days of antibiotics 4/ amp-sub           ID: Jamie Carr is a 65 y.o. female with 10 day history of cough, fever, and worsening shortness of breath with left sided pleuritic chest pain was found to have almost a subtotal opacifcation of left hemithorax with left pleural effusion requiring drainage plus 3.6 x 2.8cm mass on the anterolateral left upper quadrant invading the abdominal wall concerning for tumor. There is also small peircardial effusion. 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 Thoracostomy tube placed to help drain effusion, there still remains small-moderate left PTX with noted thick rind of nodular thickening involving the visceral pleural of left lung. Subsequent 44mL purulent fluid aspirated on 6/17 and sent for culture. Micro cultures have identified strep intermedius. The patient abtx changed to amp/sub. Leukocytosis improved from 29K to 13.7K.  6/14 cytology shows no malignant cells Principal Problem:   Pleural effusion Active Problems:   Malnutrition of moderate degree   Empyema (HCC)    Subjective: afebrile  Medications:   chlorhexidine  15 mL Mouth Rinse BID   Chlorhexidine Gluconate Cloth  6 each Topical Daily   docusate  100 mg Per Tube BID   enoxaparin (LOVENOX) injection  40 mg Subcutaneous Q24H   feeding supplement  1 Container Oral Q24H   feeding supplement (KATE FARMS STANDARD 1.4)  325 mL Oral Daily   fentaNYL       loratadine  10 mg Oral QHS   mouth rinse  15 mL Mouth Rinse q12n4p   midazolam       multivitamin with minerals  1 tablet Oral Daily   polyethylene glycol  17 g Per Tube Daily   vitamin C  250 mg Oral Daily    Objective: Vital signs in last 24 hours: Temp:  [98.1 F (36.7 C)-98.7 F (37.1 C)] 98.2 F (36.8 C) (06/17 1622) Pulse Rate:  [90-104] 93 (06/17 1622) Resp:  [18-27] 20 (06/17 1622) BP: (108-124)/(61-73) 119/72 (06/17  1622) SpO2:  [95 %-100 %] 99 % (06/17 1622) Weight:  [58 kg] 58 kg (06/16 1812)   Patient being transported to radiology  Lab Results Recent Labs    07/02/20 0257 07/03/20 0457  WBC 21.5* 13.7*  HGB 10.0* 9.8*  HCT 30.7* 30.3*  NA 132* 135  K 4.4 4.0  CL 103 103  CO2 21* 24  BUN 21 13  CREATININE 0.73 0.68   Liver Panel Recent Labs    07/02/20 0257 07/03/20 0457  PROT 5.8* 5.4*  ALBUMIN 1.8* 1.7*  AST 27 39  ALT 33 34  ALKPHOS 142* 119  BILITOT 0.9 0.6   Sedimentation Rate No results for input(s): ESRSEDRATE in the last 72 hours. C-Reactive Protein No results for input(s): CRP in the last 72 hours.  Microbiology:  Studies/Results: CT CHEST WO CONTRAST  Result Date: 07/03/2020 CLINICAL DATA:  Empyema EXAM: CT CHEST WITHOUT CONTRAST TECHNIQUE: Multidetector CT imaging of the chest was performed following the standard protocol without IV contrast. COMPARISON:  Chest CT June 30, 2020 and multiple prior chest radiographs FINDINGS: Cardiovascular: Aortic atherosclerosis without aneurysmal dilation. Normal size heart. Small pericardial effusion. Mediastinum/Nodes: Hypodense 11 mm left thyroid nodule. Not clinically significant; no follow-up imaging recommended (ref: J Am Coll Radiol. 2015 Feb;12(2): 143-50).Multiple prominent to mildly enlarged lower cervical, mediastinal and left axillary lymph nodes are again visualized and appears stable for instance  the previously described 13 mm subcarinal lymph node measures 12 mm in short axis on image 50/2 and a left cardiophrenic lymph node measuring 13 mm on image 102/2. No significant mediastinal shift. The trachea and esophagus are unremarkable. Lungs/Pleura: There is a left lateral approach thoracostomy tube extending into the posterior aspect of the left hemithorax with tip located in the major fissure. Small moderate left pneumothorax with incomplete re-expansion of the left lung extensive left-sided nodular pleural thickening  including a thick rind of nodular thickening involving the visceral pleura of the left lung. Similar appearance of the mass like area along the left anterior abdominal wall measuring 4.0 x 3.5 cm on image 129/2, which on sagittal image 118/7 appears to represent loculated fluid along the costophrenic angle as opposed to a discrete chest wall mass. Multifocal consolidations, nodularity and ground-glass opacities with septal thickening throughout the left lung. Upper Abdomen: Trace upper abdominal, predominantly perihepatic, free fluid. Musculoskeletal: Subcutaneous emphysema in the left chest wall. Bilateral breast calcifications. No acute osseous abnormality. Multifocal degenerative changes spine. IMPRESSION: 1. Left lateral approach thoracostomy tube with tip located in the major fissure, with a small moderate left pneumothorax. There is extensive nodular thickening of the parietal and visceral pleura throughout the left hemithorax with incomplete re-expansion of the left lung suggestive of the entrapment. 2. Similar appearance of the mass like area along the left anterior abdominal measuring 4 cm which is favored to represent loculated fluid along the costophrenic angle as opposed to a discrete chest wall mass. 3. Multifocal consolidations, nodularity and ground-glass opacities with septal thickening throughout the left lung. 4. Prominent to mildly enlarged lower cervical, mediastinal, and left axillary lymph nodes are again visualized and appears stable. 5. Trace upper abdominal, predominantly perihepatic, free fluid. 6. Diffuse subcutaneous edema with left chest wall subcutaneous emphysema. 7. Aortic atherosclerosis. Aortic Atherosclerosis (ICD10-I70.0). Electronically Signed   By: Maudry Mayhew MD   On: 07/03/2020 09:36   CT ASPIRATION  Result Date: 07/03/2020 INDICATION: 65 year old female with abnormal soft tissue density in the lung left anterior inferior pleural space concerning for pleural based tumor  versus extension of empyema. She presents for CT-guided aspiration versus biopsy. EXAM: CT-guided aspiration MEDICATIONS: The patient is currently admitted to the hospital and receiving intravenous antibiotics. The antibiotics were administered within an appropriate time frame prior to the initiation of the procedure. ANESTHESIA/SEDATION: Fentanyl 100 mcg IV; Versed 2 mg IV Moderate Sedation Time:  10 minutes The patient was continuously monitored during the procedure by the interventional radiology nurse under my direct supervision. COMPLICATIONS: None immediate. PROCEDURE: Informed written consent was obtained from the patient after a thorough discussion of the procedural risks, benefits and alternatives. All questions were addressed. Maximal Sterile Barrier Technique was utilized including caps, mask, sterile gowns, sterile gloves, sterile drape, hand hygiene and skin antiseptic. A timeout was performed prior to the initiation of the procedure. A planning axial CT scan was performed. The fluid collection was successfully identified. The overlying skin was sterilely prepped and draped in the standard fashion using chlorhexidine. Local anesthesia was attained by infiltration with 1% lidocaine. A small dermatotomy was made. An 18 gauge trocar needle was advanced into the structure. There was minimal tactile feedback indicating that this represents fluid rather than solid soft tissue. Aspiration was then performed yielding approximately 10 mL of thick purulent fluid. Repeat imaging demonstrates near complete resolution of the abnormality. IMPRESSION: The CT abnormality corresponds with a pocket of complex purulent fluid. 10 mL was successfully aspirated and  sent for culture. No definite soft tissue mass present for biopsy. Electronically Signed   By: Malachy MoanHeath  McCullough M.D.   On: 07/03/2020 16:22   DG CHEST PORT 1 VIEW  Result Date: 07/03/2020 CLINICAL DATA:  65 year old female with left chest tube placed for  large loculated appearing pleural effusion - which returned pus. EXAM: PORTABLE CHEST 1 VIEW COMPARISON:  Portable chest 07/02/2020 and earlier. FINDINGS: Portable AP semi upright view at 0509 hours. Stable left chest tube. Small volume left pneumothorax with circumferential appearing pleural thickening superimposed. Abnormal hazy, reticulonodular opacity throughout the left lung. Mediastinal contours are stable and within normal limits. The right lung remains clear. Visualized tracheal air column is within normal limits. Negative visible bowel gas pattern. Small volume left chest wall subcutaneous gas. No acute osseous abnormality identified. IMPRESSION: 1. Empyema with stable left chest tube. Poor re-expansion of the left lung which might be related to pneumonia. Small volume left pneumothorax/pleural gas superimposed on diffuse pleural thickening. 2. Right lung remains clear. Electronically Signed   By: Odessa FlemingH  Hall M.D.   On: 07/03/2020 06:44   DG CHEST PORT 1 VIEW  Result Date: 07/02/2020 CLINICAL DATA:  Shortness of breath EXAM: PORTABLE CHEST 1 VIEW COMPARISON:  July 01, 2020 FINDINGS: Chest tube present on the left with tip directed medially and superiorly, stable. There is a persistent lateral and lateral basilar pneumothorax on the left, marginally larger than 1 day prior. Mild subcutaneous air also noted on the left. There is airspace opacity in the left base with asymmetric pulmonary edema on the left, stable. Right lung is clear. Heart size and pulmonary vascularity are normal. No adenopathy. No bone lesions. IMPRESSION: Chest tube on left, unchanged, with left-sided pneumothorax, marginally larger. No tension component. Airspace opacity consistent with pneumonia left base. Asymmetric pulmonary edema on the left is stable. Right lung clear. Stable cardiac silhouette. Electronically Signed   By: Bretta BangWilliam  Woodruff III M.D.   On: 07/02/2020 07:54   ECHOCARDIOGRAM COMPLETE  Result Date: 07/03/2020     ECHOCARDIOGRAM REPORT   Patient Name:   Jamie Carr Date of Exam: 07/03/2020 Medical Rec #:  [id number]  Height:       62.0 in Accession #:    [id number] Weight:       127.9 lb Date of Birth:  06/27/1955   BSA:          1.581 m Patient Age:    65 years   BP:           118/61 mmHg Patient Gender: F          HR:           93 bpm. Exam Location:  Inpatient Procedure: 2D Echo, Cardiac Doppler and Color Doppler Indications:    Dyspnea R06.00  History:        Patient has no prior history of Echocardiogram examinations.                 Thyroid Disease.  Sonographer:    Elmarie Shileyiffany Dance Referring Phys: [id number] PREETHA JOSEPH  Sonographer Comments: No apical window. IMPRESSIONS  1. Left ventricular ejection fraction, by estimation, is 60 to 65%. The left ventricle has normal function. The left ventricle has no regional wall motion abnormalities. Left ventricular diastolic function could not be evaluated.  2. Right ventricular systolic function is normal. The right ventricular size is normal.  3. The mitral valve is normal in structure. No evidence of mitral valve regurgitation. No evidence of mitral stenosis.  4. The aortic valve is grossly normal. Aortic valve regurgitation is not visualized. No aortic stenosis is present. FINDINGS  Left Ventricle: Left ventricular ejection fraction, by estimation, is 60 to 65%. The left ventricle has normal function. The left ventricle has no regional wall motion abnormalities. The left ventricular internal cavity size was normal in size. There is  no left ventricular hypertrophy. Left ventricular diastolic function could not be evaluated. Right Ventricle: The right ventricular size is normal. No increase in right ventricular wall thickness. Right ventricular systolic function is normal. Left Atrium: Left atrial size was normal in size. Right Atrium: Right atrial size was normal in size. Pericardium: There is no evidence of pericardial effusion. Mitral Valve: The mitral valve is normal in structure.  No evidence of mitral valve regurgitation. No evidence of mitral valve stenosis. Tricuspid Valve: The tricuspid valve is normal in structure. Tricuspid valve regurgitation is trivial. Aortic Valve: The aortic valve is grossly normal. Aortic valve regurgitation is not visualized. No aortic stenosis is present. Pulmonic Valve: The pulmonic valve was not well visualized. Pulmonic valve regurgitation is not visualized. Aorta: The aortic root and ascending aorta are structurally normal, with no evidence of dilitation. IAS/Shunts: The atrial septum is grossly normal.  LEFT VENTRICLE PLAX 2D LVIDd:         3.70 cm LVIDs:         2.60 cm LV PW:         1.00 cm LV IVS:        0.90 cm LVOT diam:     2.20 cm LVOT Area:     3.80 cm  IVC IVC diam: 1.70 cm LEFT ATRIUM         Index LA diam:    2.80 cm 1.77 cm/m   AORTA Ao Root diam: 3.10 cm Ao Asc diam:  3.30 cm  SHUNTS Systemic Diam: 2.20 cm Kristeen Miss MD Electronically signed by Kristeen Miss MD Signature Date/Time: 07/03/2020/3:12:43 PM    Final      Assessment/Plan: Empyema with strep intermedius = recommend prolonged treatment, roughly 4 wks of abtx. Continue on amp/sub for the time being. As we get closer to discharge, can likely find oral equivalent  Pleural mass = she would benefit from having CT surgery, to evaluate patient to consider getting tissue biopsy at this time to determine cause of mass and further management, also would like them to weigh in if she needs a VATS for decortication of thick rind from infection.  Leukocytosis = improving  We will provide more recs over the weekend.  [facility] Cell: [phone] Pager: [phone]  07/03/2020, 6:02 PM

## 2020-07-03 NOTE — Procedures (Signed)
Interventional Radiology Procedure Note  Procedure: CT guided aspiration of left chest wall mass yielding 10 mL purulent material.    Complications: None  Estimated Blood Loss: None  Recommendations: - Samples sent for culture   Signed,  Sterling Big, MD

## 2020-07-03 NOTE — Progress Notes (Signed)
PROGRESS NOTE    Jamie Carr  ZOX:[id number] DOB: 06/08/1955 DOA: 06/30/2020 PCP: Henreitta Leber, PA-C   Brief Narrative:  The patient is a 65 year old thin Caucasian female with a past medical history significant for but not limited to prior thyroid disease and tremors as well as other comorbidities who presented to the ED with ongoing dyspnea for 2 to 3 weeks, cough and left-sided chest pain.  Her symptoms progressively started worsening and she was seen in the [facility] ER at [facility] and initial evaluation she is found to be tachypneic with a chest x-ray and chest CT noting complete opacification of the left hemipneumothorax.  CT noted a large left pleural effusion with concern for tumor at the hilum and pleural-based mass.  The case was discussed with pulmonary and the decision was made to transfer the patient to [facility] for further evaluation management.  In the ED she had a left-sided chest tube placed and 1 L was drained and this was and subsequently sent to the hospital.  Of note her Pleurx catheter stopped working and had to be exchanged out by pulmonary today and subsequently she was complaining of pain so she was placed on Precedex drip and became hypotensive.  Pulmonary is managing closely with a low threshold to start pressors subsequently improved and was weaned to room air.   Repeat CT Scan done and showed Abdominal Fluid Collection. IR consulted drained the fluid. ID consulted for further evaluation and recommendations.   Assessment & Plan:   Principal Problem:   Pleural effusion Active Problems:   Malnutrition of moderate degree   Empyema (HCC)  Acute Respiratory Failure with hypoxia in the setting of Large Left Sided pleural effusion with concern for underlying malignancy but now the setting of strep intermedius empyema, present on admission -Admitted to the SDU  -Initially suspected malignant pleural effusion however the pleural fluid analysis showed no malignant cells and did  show inflammation -Cultures are growing strep intermedius -Patient had a chest tube placement yesterday in the ED however it failed because it failed to drain due to likely blockage and kinking -Will follow up on her pleural fluid cell count, pH, protein, LDH and cytology cultures; pleural fluid color was obtained milky with 16,000 total nucleated cell count and turbid appearance -Pulmonary was consulted for further assistance and evaluation -Because of fevers and leukocytosis of empirically added IV ceftriaxone and azithromycin -Pulmonary Dr. Katrinka Blazing reevaluated and pulled initial chest tube and replaced it and now it is draining appropriately -WBC went from 29.3 -> 23.0 -> 21.7 -> 13.7 and is further improved to 13.7 -Repeat CXR yesterday Afternoon showed "Significant reduction in left-sided pleural effusion. Pleural margins are somewhat thickened which corresponds to the pleural density seen on recent CT examination.  Left lower lobe infiltrate" -Repeat chest x-ray today shows possible reexpansion edema and a LI of the left lung and pneumothorax ex vacuo; her chest tube is still draining pus and Down but the cultures from it is growing Streptococcus intermedius which is pansensitive so she was de-escalated to IV Unasyn from IV ceftriaxone -SpO2: 99 % O2 Flow Rate (L/min): 4 L/min; now is off of supplemental oxygen via nasal cannula -Continuous pulse oximetry maintain O2 saturation greater 90% -Because of the patient discomfort she was placed on a Precedex drip by pulmonary and will continue per their recommendations -Continue supplemental oxygen via nasal cannula and wean O2 as tolerated; pulmonary recommending continue chest tube and wean FiO2 for pulse ox greater than 88 --Repeat CT Scan  showed "Left lateral approach thoracostomy tube with tip located in the major fissure, with a small moderate left pneumothorax. There is extensive nodular thickening of the parietal and visceral pleura throughout  the left hemithorax with incomplete re-expansion of the left lung suggestive of the entrapment.  Similar appearance of the mass like area along the left anterior abdominal measuring 4 cm which is favored  to represent loculated fluid along the costophrenic angle as opposed to a discrete chest wall mass. Multifocal consolidations, nodularity and ground-glass opacities with septal thickening throughout the left lung. Prominent to mildly enlarged lower cervical, mediastinal, and left axillary lymph nodes are again visualized and appears stable. Trace upper abdominal, predominantly perihepatic, free fluid.  Diffuse subcutaneous edema with left chest wall subcutaneous emphysema. Aortic atherosclerosis." -Because of her empyema pulmonary recommending evaluating for head and neck origin especially all infections and they are getting an echocardiogram given the rare incidence of endocarditis but also the negative CT of the chest to assess if she needs intrapleural lytics but Pulmonary reviewed  Scan and will hold off intrapleural Lytics.  -ID consulted for further evaluation and Recc's; ID recommending a prolonged treatment with 4 weeks of antibiotics and continue on ampicillin sulbactam for the time being and finding an oral equivalent the closest time of discharge  Hypotension -In the setting of the Precedex drip, now improved -Pulmonary is providing boluses for the patient -Continue monitor blood pressures per protocol -If necessary may need low-dose pressors -Blood pressure dropped to 80/40 yesterday afternoon but is now improved and is 119/72  Normocytic Anemia -Anemia panel was checked and showed an iron level of 20, U IBC 163, TIBC 183 saturation ratios of 11%, ferritin level 912, folate level 12.0 and vitamin B12 of 926 -Patient's Hgb/Hct went from 10.5/30.6 -> 10.6/31.6 -> 9.4/28.4  -> 10.0/30.7 and is now 9.8/30.3 -Continue to monitor for signs and symptoms of bleeding; currently no overt bleeding  noted For repeat CBC in a.m.  Thrombocytosis -Likely Reactive from Above in setting of empyema -Patient's Platelet Count went from 635 -> 620 -> 571 -> 618 and today is 627 -Continue to Monitor and Trend -Repeat CBC in the AM  Hyponatremia -Patient's Na+ was 132 and improved to 135 -Initially there was a concern for malignancy however now is in the setting of empyema -Continue to Monitor and Trend -Repeat CMP in the AM  Hypomagnesemia -Replete. Mag Level is now 1.9 -Continue to Monitor and Replete as Necessary -Repeat CMP in the AM   Metabolic Acidosis -Mild and improved. Patient's CO2 is now 24, AG is 8, and Chloride Level is 103 -Continue to Monitor and Trend -Repeat CMP in the AM   Abnormal EKG -Was concerning for pericardial effusion -Echocardiogram and showed Normal EF of 60-65%  Left hilar mass and left pleural soft tissue mass with concern for malignancy however this is now a Streptococcus intermedius empyema -She had a pending pleural fluid studies which we will need to follow cytology for -Pulmonary recommending repeating chest CT once effusion is resolved for better characterization of chest possible masses -Per pulmonary if pleural fluid remains unremarkable/indeterminate may need a bronchoscopy for biopsy -Repeat CT Scan showed "Left lateral approach thoracostomy tube with tip located in the major fissure, with a small moderate left pneumothorax. There is extensive nodular thickening of the parietal and visceral pleura throughout the left hemithorax with incomplete re-expansion of the left lung suggestive of the entrapment.  Similar appearance of the mass like area along the left anterior abdominal  measuring 4 cm which is favored  to represent loculated fluid along the costophrenic angle as opposed to a discrete chest wall mass. Multifocal consolidations, nodularity and ground-glass opacities with septal thickening throughout the left lung. Prominent to mildly enlarged  lower cervical, mediastinal, and left axillary lymph nodes are again visualized and appears stable. Trace upper abdominal, predominantly perihepatic, free fluid.  Diffuse subcutaneous edema with left chest wall subcutaneous emphysema. Aortic atherosclerosis." -ID consulted for further evaluation; they are recommending CT surgery to evaluate the patient to get up tissue biopsy to determine the cause of mass and further management and have them if she needs a VATS for decortication of thick rind from infection  Abdominal Loculated Fluid Collection -As above -Obtained IR eval and CT guided Aspiration which sent 10 mL   Non-Severe (Moderate) Malnutrition in the context of Acute Illness/Injury  -Nutritionist consulted for further evaluation and recommendations -The nutritionist team is recommending boost breeze once a day as well as Molli Posey 1.4 p.o. once a day and a multivitamin with minerals daily  DVT prophylaxis: Enoxaparin 40 mg sq q24h Code Status: FULL CODE  Family Communication: No family present at bedside  Disposition Plan: Pending further clearance and evaluation by pulmonary; patient also need PT OT to further evaluate and treat  Status is: Inpatient  Remains inpatient appropriate because:Ongoing diagnostic testing needed not appropriate for outpatient work up, Unsafe d/c plan, IV treatments appropriate due to intensity of illness or inability to take PO, and Inpatient level of care appropriate due to severity of illness  Dispo: The patient is from: Home              Anticipated d/c is to: Home              Patient currently is not medically stable to d/c.   Difficult to place patient No  Consultants:  Pulmonary IR ID  Procedures: Chest Tube Insertion  ECHOCARDIOGRAM IMPRESSIONS     1. Left ventricular ejection fraction, by estimation, is 60 to 65%. The  left ventricle has normal function. The left ventricle has no regional  wall motion abnormalities. Left ventricular  diastolic function could not  be evaluated.   2. Right ventricular systolic function is normal. The right ventricular  size is normal.   3. The mitral valve is normal in structure. No evidence of mitral valve  regurgitation. No evidence of mitral stenosis.   4. The aortic valve is grossly normal. Aortic valve regurgitation is not  visualized. No aortic stenosis is present.   FINDINGS   Left Ventricle: Left ventricular ejection fraction, by estimation, is 60  to 65%. The left ventricle has normal function. The left ventricle has no  regional wall motion abnormalities. The left ventricular internal cavity  size was normal in size. There is   no left ventricular hypertrophy. Left ventricular diastolic function  could not be evaluated.   Right Ventricle: The right ventricular size is normal. No increase in  right ventricular wall thickness. Right ventricular systolic function is  normal.   Left Atrium: Left atrial size was normal in size.   Right Atrium: Right atrial size was normal in size.   Pericardium: There is no evidence of pericardial effusion.   Mitral Valve: The mitral valve is normal in structure. No evidence of  mitral valve regurgitation. No evidence of mitral valve stenosis.   Tricuspid Valve: The tricuspid valve is normal in structure. Tricuspid  valve regurgitation is trivial.   Aortic Valve: The aortic valve  is grossly normal. Aortic valve  regurgitation is not visualized. No aortic stenosis is present.   Pulmonic Valve: The pulmonic valve was not well visualized. Pulmonic valve  regurgitation is not visualized.   Aorta: The aortic root and ascending aorta are structurally normal, with  no evidence of dilitation.   IAS/Shunts: The atrial septum is grossly normal.      LEFT VENTRICLE  PLAX 2D  LVIDd:         3.70 cm  LVIDs:         2.60 cm  LV PW:         1.00 cm  LV IVS:        0.90 cm  LVOT diam:     2.20 cm  LVOT Area:     3.80 cm      IVC  IVC  diam: 1.70 cm   LEFT ATRIUM         Index  LA diam:    2.80 cm 1.77 cm/m      AORTA  Ao Root diam: 3.10 cm  Ao Asc diam:  3.30 cm      SHUNTS  Systemic Diam: 2.20 cm   Antimicrobials:  Anti-infectives (From admission, onward)    Start     Dose/Rate Route Frequency Ordered Stop   07/03/20 0600  Ampicillin-Sulbactam (UNASYN) 3 g in sodium chloride 0.9 % 100 mL IVPB        3 g 200 mL/hr over 30 Minutes Intravenous Every 6 hours 07/02/20 1409     07/01/20 1045  cefTRIAXone (ROCEPHIN) 2 g in sodium chloride 0.9 % 100 mL IVPB  Status:  Discontinued        2 g 200 mL/hr over 30 Minutes Intravenous Every 24 hours 07/01/20 0947 07/02/20 1409   07/01/20 1000  cefTRIAXone (ROCEPHIN) 1 g in sodium chloride 0.9 % 100 mL IVPB  Status:  Discontinued        1 g 200 mL/hr over 30 Minutes Intravenous Every 24 hours 06/30/20 1723 07/01/20 0947   07/01/20 1000  azithromycin (ZITHROMAX) 500 mg in sodium chloride 0.9 % 250 mL IVPB  Status:  Discontinued        500 mg 250 mL/hr over 60 Minutes Intravenous Every 24 hours 06/30/20 1723 07/02/20 1409   06/30/20 0700  vancomycin (VANCOCIN) IVPB 1000 mg/200 mL premix        1,000 mg 200 mL/hr over 60 Minutes Intravenous  Once 06/30/20 0655 06/30/20 1134   06/30/20 0700  cefTRIAXone (ROCEPHIN) 2 g in sodium chloride 0.9 % 100 mL IVPB        2 g 200 mL/hr over 30 Minutes Intravenous  Once 06/30/20 0655 06/30/20 1017   06/30/20 0700  azithromycin (ZITHROMAX) 500 mg in sodium chloride 0.9 % 250 mL IVPB        500 mg 250 mL/hr over 60 Minutes Intravenous  Once 06/30/20 0655 06/30/20 1030        Subjective: Seen and examined at bedside pain had improved.  Felt okay.  No nausea or vomiting.  Dyspnea is improved but she is going supplemental oxygen via nasal cannula.  No lightheadedness or dizziness but felt abdominal fullness and tenderness in the left upper quadrant.  No other concerns or complaints at this time.  Objective: Vitals:   07/03/20 1525  07/03/20 1540 07/03/20 1545 07/03/20 1622  BP: 111/64 112/69 113/61 119/72  Pulse: 91 95 90 93  Resp: (!) 26 (!) 24 (!) 27 20  Temp:  98.2 F (36.8 C)  TempSrc:    Oral  SpO2: 100% 100% 100% 99%  Weight:      Height:        Intake/Output Summary (Last 24 hours) at 07/03/2020 2005 Last data filed at 07/03/2020 1853 Gross per 24 hour  Intake 350 ml  Output 1190 ml  Net -840 ml    Filed Weights   06/30/20 0625 07/02/20 1812  Weight: 55.3 kg 58 kg   Examination: Physical Exam:  Constitutional: The patient is a thin cachectic Caucasian female currently in no acute distress appears more comfortable Eyes: Lids and conjunctivae normal, sclerae anicteric  ENMT: External Ears, Nose appear normal. Grossly normal hearing. .  Neck: Appears normal, supple, no cervical masses, normal ROM, no appreciable thyromegaly; no JVD Respiratory: Clear to auscultation bilaterally with coarse breath sounds worse on the left compared to right, no wheezing, rales, rhonchi or crackles. Normal respiratory effort and patient is not tachypenic. No accessory muscle use.  Wearing supplemental oxygen via nasal cannula Cardiovascular: RRR, no murmurs / rubs / gallops. S1 and S2 auscultated. No extremity edema.  Abdomen: Soft, non-tender, non-distended. Bowel sounds positive.  GU: Deferred. Musculoskeletal: No clubbing / cyanosis of digits/nails. No joint deformity upper and lower extremities.  Skin: No rashes, lesions, ulcers on limited skin evaluation. No induration; Warm and dry.  Neurologic: CN 2-12 grossly intact with no focal deficits. Romberg sign and cerebellar reflexes not assessed.  Psychiatric: Normal judgment and insight. Alert and oriented x 3. Normal mood and appropriate affect.   Data Reviewed: I have personally reviewed following labs and imaging studies  CBC: Recent Labs  Lab 06/30/20 0625 06/30/20 1922 07/01/20 0246 07/02/20 0257 07/03/20 0457  WBC 29.3* 23.0* 21.7* 21.5* 13.7*   NEUTROABS 24.7*  --   --  16.6* 10.2*  HGB 10.5* 10.6* 9.4* 10.0* 9.8*  HCT 30.6* 31.6* 28.4* 30.7* 30.3*  MCV 91.1 93.2 93.7 95.3 95.3  PLT 635* 620* 571* 618* 627*    Basic Metabolic Panel: Recent Labs  Lab 06/30/20 0625 06/30/20 1922 07/01/20 0246 07/02/20 0257 07/03/20 0457  NA 132*  --  132* 132* 135  K 3.7  --  3.5 4.4 4.0  CL 99  --  103 103 103  CO2 20*  --  21* 21* 24  GLUCOSE 118*  --  106* 79 96  BUN 36*  --  33* 21 13  CREATININE 0.88 0.90 0.78 0.73 0.68  CALCIUM 10.7*  --  8.9 9.3 9.0  MG  --   --   --  1.8 1.9  PHOS  --   --   --  3.7 3.6    GFR: Estimated Creatinine Clearance: 56.9 mL/min (by C-G formula based on SCr of 0.68 mg/dL). Liver Function Tests: Recent Labs  Lab 07/01/20 0246 07/02/20 0257 07/03/20 0457  AST 32 27 39  ALT 35 33 34  ALKPHOS 141* 142* 119  BILITOT 0.7 0.9 0.6  PROT 5.4* 5.8* 5.4*  ALBUMIN 1.8* 1.8* 1.7*    No results for input(s): LIPASE, AMYLASE in the last 168 hours. No results for input(s): AMMONIA in the last 168 hours. Coagulation Profile: No results for input(s): INR, PROTIME in the last 168 hours. Cardiac Enzymes: No results for input(s): CKTOTAL, CKMB, CKMBINDEX, TROPONINI in the last 168 hours. BNP (last 3 results) No results for input(s): PROBNP in the last 8760 hours. HbA1C: No results for input(s): HGBA1C in the last 72 hours. CBG: Recent Labs  Lab 07/02/20 1752  07/02/20 2353 07/03/20 0729 07/03/20 1220 07/03/20 1620  GLUCAP 70 81 77 120* 89    Lipid Profile: No results for input(s): CHOL, HDL, LDLCALC, TRIG, CHOLHDL, LDLDIRECT in the last 72 hours. Thyroid Function Tests: No results for input(s): TSH, T4TOTAL, FREET4, T3FREE, THYROIDAB in the last 72 hours. Anemia Panel: Recent Labs    07/02/20 0257  VITAMINB12 926*  FOLATE 12.0  FERRITIN 912*  TIBC 183*  IRON 20*  RETICCTPCT 1.7    Sepsis Labs: Recent Labs  Lab 06/30/20 0737 06/30/20 1114  LATICACIDVEN 1.2 1.3     Recent  Results (from the past 240 hour(s))  Resp Panel by RT-PCR (Flu A&B, Covid) Nasopharyngeal Swab     Status: None   Collection Time: 06/30/20  6:42 AM   Specimen: Nasopharyngeal Swab; Nasopharyngeal(NP) swabs in vial transport medium  Result Value Ref Range Status   SARS Coronavirus 2 by RT PCR NEGATIVE NEGATIVE Final    Comment: (NOTE) SARS-CoV-2 target nucleic acids are NOT DETECTED.  The SARS-CoV-2 RNA is generally detectable in upper respiratory specimens during the acute phase of infection. The lowest concentration of SARS-CoV-2 viral copies this assay can detect is 138 copies/mL. A negative result does not preclude SARS-Cov-2 infection and should not be used as the sole basis for treatment or other patient management decisions. A negative result may occur with  improper specimen collection/handling, submission of specimen other than nasopharyngeal swab, presence of viral mutation(s) within the areas targeted by this assay, and inadequate number of viral copies(<138 copies/mL). A negative result must be combined with clinical observations, patient history, and epidemiological information. The expected result is Negative.  Fact Sheet for Patients:  BloggerCourse.com  Fact Sheet for Healthcare Providers:  SeriousBroker.it  This test is no t yet approved or cleared by the [place] FDA and  has been authorized for detection and/or diagnosis of SARS-CoV-2 by FDA under an Emergency Use Authorization (EUA). This EUA will remain  in effect (meaning this test can be used) for the duration of the COVID-19 declaration under Section 564(b)(1) of the Act, 21 U.S.C.section 360bbb-3(b)(1), unless the authorization is terminated  or revoked sooner.       Influenza A by PCR NEGATIVE NEGATIVE Final   Influenza B by PCR NEGATIVE NEGATIVE Final    Comment: (NOTE) The Xpert Xpress SARS-CoV-2/FLU/RSV plus assay is intended as an aid in the  diagnosis of influenza from Nasopharyngeal swab specimens and should not be used as a sole basis for treatment. Nasal washings and aspirates are unacceptable for Xpert Xpress SARS-CoV-2/FLU/RSV testing.  Fact Sheet for Patients: BloggerCourse.com  Fact Sheet for Healthcare Providers: SeriousBroker.it  This test is not yet approved or cleared by the [place] FDA and has been authorized for detection and/or diagnosis of SARS-CoV-2 by FDA under an Emergency Use Authorization (EUA). This EUA will remain in effect (meaning this test can be used) for the duration of the COVID-19 declaration under Section 564(b)(1) of the Act, 21 U.S.C. section 360bbb-3(b)(1), unless the authorization is terminated or revoked.  Performed at [facility], [address] [place], [place] [address]   Blood culture (routine x 2)     Status: None (Preliminary result)   Collection Time: 06/30/20  7:37 AM   Specimen: Left Antecubital; Blood  Result Value Ref Range Status   Specimen Description   Final    LEFT ANTECUBITAL Performed at [facility], [address] [place], [place] [address]    Special Requests   Final  Blood Culture adequate volume Performed at [facility], [address], [place], [place] [address]    Culture   Final    NO GROWTH 3 DAYS Performed at [facility], [address], [place], [place] [address]    Report Status PENDING  Incomplete  Blood culture (routine x 2)     Status: None (Preliminary result)   Collection Time: 06/30/20  7:42 AM   Specimen: Right Antecubital; Blood  Result Value Ref Range Status   Specimen Description   Final    RIGHT ANTECUBITAL Performed at [facility], [address], [place], [place] [address]    Special Requests   Final    Blood Culture adequate volume Performed at [facility], [address] [place], [place] [address]    Culture   Final    NO GROWTH 3 DAYS Performed at [facility], [address] [place], [place] [address]    Report Status PENDING  Incomplete  Body fluid culture w Gram Stain     Status: None   Collection Time: 06/30/20 10:01 AM   Specimen: Pleural Fluid  Result Value Ref Range Status   Specimen Description   Final    PLEURAL Performed at [facility], [address], [place], [place] [address]    Special Requests   Final    NONE Performed at [facility], [address] [place], [place] [address]    Gram Stain   Final    ABUNDANT WBC PRESENT,BOTH PMN AND MONONUCLEAR ABUNDANT GRAM POSITIVE COCCI Performed at [facility], [address], [place], [place] [address]    Culture ABUNDANT STREPTOCOCCUS INTERMEDIUS  Final   Report Status 07/02/2020 FINAL  Final   Organism ID, Bacteria STREPTOCOCCUS INTERMEDIUS  Final      Susceptibility   Streptococcus intermedius - MIC*    PENICILLIN <=0.06 SENSITIVE Sensitive     CEFTRIAXONE 0.25 SENSITIVE Sensitive     ERYTHROMYCIN <=0.12 SENSITIVE Sensitive     LEVOFLOXACIN 0.5 SENSITIVE Sensitive     VANCOMYCIN 0.5 SENSITIVE Sensitive     * ABUNDANT STREPTOCOCCUS INTERMEDIUS  MRSA PCR Screening     Status: None   Collection Time: 06/30/20  4:17 PM   Specimen: Nasopharyngeal  Result Value Ref Range Status   MRSA by PCR NEGATIVE NEGATIVE Final    Comment:        The GeneXpert MRSA Assay (FDA approved for NASAL specimens only), is one component of a comprehensive MRSA colonization surveillance program. It is not intended to diagnose MRSA infection nor to guide or monitor treatment for MRSA infections. Performed at [facility], [address], [place], [place] [address]   Body fluid culture w Gram Stain     Status: None   Collection Time: 07/01/20 12:30 PM   Specimen: Pleura; Body Fluid  Result Value Ref Range Status    Specimen Description   Final    PLEURAL LT Performed at [facility], [address] [place], [place] [address]    Special Requests   Final    NONE Performed at [facility], [address], [place], [place] [address]    Gram Stain   Final    ABUNDANT WBC PRESENT,BOTH PMN AND MONONUCLEAR ABUNDANT GRAM POSITIVE COCCI    Culture   Final    ABUNDANT STREPTOCOCCUS INTERMEDIUS SUSCEPTIBILITIES PERFORMED ON PREVIOUS CULTURE WITHIN THE LAST 5 DAYS. Performed at [facility],  [address], [place], [place] [address]    Report Status 07/03/2020 FINAL  Final     RN Pressure Injury Documentation:     Estimated body mass index is 23.39 kg/m as calculated from the following:   Height as of this encounter: 5\' 2"  (1.575 m).   Weight as of this encounter: 58 kg.  Malnutrition Type:  Nutrition Problem: Moderate Malnutrition Etiology: acute illness   Malnutrition Characteristics:  Signs/Symptoms: mild fat depletion, mild muscle depletion   Nutrition Interventions:  Interventions: Boost Breeze, MVI, Other (Comment) [employer] 1.4)   Radiology Studies: CT CHEST WO CONTRAST  Result Date: 07/03/2020 CLINICAL DATA:  [place] EXAM: CT CHEST WITHOUT CONTRAST TECHNIQUE: Multidetector CT imaging of the chest was performed following the standard protocol without IV contrast. COMPARISON:  Chest CT June 30, 2020 and multiple prior chest radiographs FINDINGS: Cardiovascular: Aortic atherosclerosis without aneurysmal dilation. Normal size heart. Small pericardial effusion. Mediastinum/Nodes: Hypodense 11 mm left thyroid nodule. Not clinically significant; no follow-up imaging recommended (ref: J Am Coll Radiol. 2015 Feb;12(2): 143-50).Multiple prominent to mildly enlarged lower cervical, mediastinal and left axillary lymph nodes are again visualized and appears stable for instance the previously described 13 mm subcarinal lymph node measures 12 mm in  short axis on image 50/2 and a left cardiophrenic lymph node measuring 13 mm on image 102/2. No significant mediastinal shift. The trachea and esophagus are unremarkable. Lungs/Pleura: There is a left lateral approach thoracostomy tube extending into the posterior aspect of the left hemithorax with tip located in the major fissure. Small moderate left pneumothorax with incomplete re-expansion of the left lung extensive left-sided nodular pleural thickening including a thick rind of nodular thickening involving the visceral pleura of the left lung. Similar appearance of the mass like area along the left anterior abdominal wall measuring 4.0 x 3.5 cm on image 129/2, which on sagittal image 118/7 appears to represent loculated fluid along the costophrenic angle as opposed to a discrete chest wall mass. Multifocal consolidations, nodularity and ground-glass opacities with septal thickening throughout the left lung. Upper Abdomen: Trace upper abdominal, predominantly perihepatic, free fluid. Musculoskeletal: Subcutaneous emphysema in the left chest wall. Bilateral breast calcifications. No acute osseous abnormality. Multifocal degenerative changes spine. IMPRESSION: 1. Left lateral approach thoracostomy tube with tip located in the major fissure, with a small moderate left pneumothorax. There is extensive nodular thickening of the parietal and visceral pleura throughout the left hemithorax with incomplete re-expansion of the left lung suggestive of the entrapment. 2. Similar appearance of the mass like area along the left anterior abdominal measuring 4 cm which is favored to represent loculated fluid along the costophrenic angle as opposed to a discrete chest wall mass. 3. Multifocal consolidations, nodularity and ground-glass opacities with septal thickening throughout the left lung. 4. Prominent to mildly enlarged lower cervical, mediastinal, and left axillary lymph nodes are again visualized and appears stable. 5.  Trace upper abdominal, predominantly perihepatic, free fluid. 6. Diffuse subcutaneous edema with left chest wall subcutaneous emphysema. 7. Aortic atherosclerosis. Aortic Atherosclerosis (ICD10-I70.0). Electronically Signed   By: Maudry Mayhew MD   On: 07/03/2020 09:36   CT ASPIRATION  Result Date: 07/03/2020 INDICATION: 65 year old female with abnormal soft tissue density in the lung left anterior inferior pleural space concerning for pleural based tumor versus extension of empyema. She presents for CT-guided aspiration versus biopsy. EXAM: CT-guided aspiration MEDICATIONS: The patient is currently admitted to the hospital and receiving intravenous antibiotics. The antibiotics were administered within an appropriate time frame prior  to the initiation of the procedure. ANESTHESIA/SEDATION: Fentanyl 100 mcg IV; Versed 2 mg IV Moderate Sedation Time:  10 minutes The patient was continuously monitored during the procedure by the interventional radiology nurse under my direct supervision. COMPLICATIONS: None immediate. PROCEDURE: Informed written consent was obtained from the patient after a thorough discussion of the procedural risks, benefits and alternatives. All questions were addressed. Maximal Sterile Barrier Technique was utilized including caps, mask, sterile gowns, sterile gloves, sterile drape, hand hygiene and skin antiseptic. A timeout was performed prior to the initiation of the procedure. A planning axial CT scan was performed. The fluid collection was successfully identified. The overlying skin was sterilely prepped and draped in the standard fashion using chlorhexidine. Local anesthesia was attained by infiltration with 1% lidocaine. A small dermatotomy was made. An 18 gauge trocar needle was advanced into the structure. There was minimal tactile feedback indicating that this represents fluid rather than solid soft tissue. Aspiration was then performed yielding approximately 10 mL of thick purulent  fluid. Repeat imaging demonstrates near complete resolution of the abnormality. IMPRESSION: The CT abnormality corresponds with a pocket of complex purulent fluid. 10 mL was successfully aspirated and sent for culture. No definite soft tissue mass present for biopsy. Electronically Signed   By: Malachy Moan M.D.   On: 07/03/2020 16:22   DG CHEST PORT 1 VIEW  Result Date: 07/03/2020 CLINICAL DATA:  65 year old female with left chest tube placed for large loculated appearing pleural effusion - which returned pus. EXAM: PORTABLE CHEST 1 VIEW COMPARISON:  Portable chest 07/02/2020 and earlier. FINDINGS: Portable AP semi upright view at 0509 hours. Stable left chest tube. Small volume left pneumothorax with circumferential appearing pleural thickening superimposed. Abnormal hazy, reticulonodular opacity throughout the left lung. Mediastinal contours are stable and within normal limits. The right lung remains clear. Visualized tracheal air column is within normal limits. Negative visible bowel gas pattern. Small volume left chest wall subcutaneous gas. No acute osseous abnormality identified. IMPRESSION: 1. Empyema with stable left chest tube. Poor re-expansion of the left lung which might be related to pneumonia. Small volume left pneumothorax/pleural gas superimposed on diffuse pleural thickening. 2. Right lung remains clear. Electronically Signed   By: Odessa Fleming M.D.   On: 07/03/2020 06:44   DG CHEST PORT 1 VIEW  Result Date: 07/02/2020 CLINICAL DATA:  Shortness of breath EXAM: PORTABLE CHEST 1 VIEW COMPARISON:  July 01, 2020 FINDINGS: Chest tube present on the left with tip directed medially and superiorly, stable. There is a persistent lateral and lateral basilar pneumothorax on the left, marginally larger than 1 day prior. Mild subcutaneous air also noted on the left. There is airspace opacity in the left base with asymmetric pulmonary edema on the left, stable. Right lung is clear. Heart size and  pulmonary vascularity are normal. No adenopathy. No bone lesions. IMPRESSION: Chest tube on left, unchanged, with left-sided pneumothorax, marginally larger. No tension component. Airspace opacity consistent with pneumonia left base. Asymmetric pulmonary edema on the left is stable. Right lung clear. Stable cardiac silhouette. Electronically Signed   By: Bretta Bang III M.D.   On: 07/02/2020 07:54   ECHOCARDIOGRAM COMPLETE  Result Date: 07/03/2020    ECHOCARDIOGRAM REPORT   Patient Name:   Jamie Carr Date of Exam: 07/03/2020 Medical Rec #:  [id number]  Height:       62.0 in Accession #:    [id number] Weight:       127.9 lb Date of Birth:  [birth year]  BSA:          1.581 m Patient Age:    65 years   BP:           118/61 mmHg Patient Gender: F          HR:           93 bpm. Exam Location:  Inpatient Procedure: 2D Echo, Cardiac Doppler and Color Doppler Indications:    Dyspnea R06.00  History:        Patient has no prior history of Echocardiogram examinations.                 Thyroid Disease.  Sonographer:    Elmarie Shiley Dance Referring Phys: 61 PREETHA JOSEPH  Sonographer Comments: No apical window. IMPRESSIONS  1. Left ventricular ejection fraction, by estimation, is 60 to 65%. The left ventricle has normal function. The left ventricle has no regional wall motion abnormalities. Left ventricular diastolic function could not be evaluated.  2. Right ventricular systolic function is normal. The right ventricular size is normal.  3. The mitral valve is normal in structure. No evidence of mitral valve regurgitation. No evidence of mitral stenosis.  4. The aortic valve is grossly normal. Aortic valve regurgitation is not visualized. No aortic stenosis is present. FINDINGS  Left Ventricle: Left ventricular ejection fraction, by estimation, is 60 to 65%. The left ventricle has normal function. The left ventricle has no regional wall motion abnormalities. The left ventricular internal cavity size was normal in size.  There is  no left ventricular hypertrophy. Left ventricular diastolic function could not be evaluated. Right Ventricle: The right ventricular size is normal. No increase in right ventricular wall thickness. Right ventricular systolic function is normal. Left Atrium: Left atrial size was normal in size. Right Atrium: Right atrial size was normal in size. Pericardium: There is no evidence of pericardial effusion. Mitral Valve: The mitral valve is normal in structure. No evidence of mitral valve regurgitation. No evidence of mitral valve stenosis. Tricuspid Valve: The tricuspid valve is normal in structure. Tricuspid valve regurgitation is trivial. Aortic Valve: The aortic valve is grossly normal. Aortic valve regurgitation is not visualized. No aortic stenosis is present. Pulmonic Valve: The pulmonic valve was not well visualized. Pulmonic valve regurgitation is not visualized. Aorta: The aortic root and ascending aorta are structurally normal, with no evidence of dilitation. IAS/Shunts: The atrial septum is grossly normal.  LEFT VENTRICLE PLAX 2D LVIDd:         3.70 cm LVIDs:         2.60 cm LV PW:         1.00 cm LV IVS:        0.90 cm LVOT diam:     2.20 cm LVOT Area:     3.80 cm  IVC IVC diam: 1.70 cm LEFT ATRIUM         Index LA diam:    2.80 cm 1.77 cm/m   AORTA Ao Root diam: 3.10 cm Ao Asc diam:  3.30 cm  SHUNTS Systemic Diam: 2.20 cm Kristeen Miss MD Electronically signed by Kristeen Miss MD Signature Date/Time: 07/03/2020/3:12:43 PM    Final     Scheduled Meds:  chlorhexidine  15 mL Mouth Rinse BID   Chlorhexidine Gluconate Cloth  6 each Topical Daily   docusate  100 mg Per Tube BID   enoxaparin (LOVENOX) injection  40 mg Subcutaneous Q24H   feeding supplement  1 Container Oral Q24H   feeding supplement (KATE [employer]AR[employer] STANDARD  1.4)  325 mL Oral Daily   fentaNYL       loratadine  10 mg Oral QHS   mouth rinse  15 mL Mouth Rinse q12n4p   midazolam       multivitamin with minerals  1 tablet Oral Daily    polyethylene glycol  17 g Per Tube Daily   vitamin C  250 mg Oral Daily   Continuous Infusions:  sodium chloride     ampicillin-sulbactam (UNASYN) IV 3 g (07/03/20 1719)    LOS: 3 days   Merlene Laughter, DO [facility] PAGER is on AMION  If 7PM-7AM, please contact night-coverage [email or web address]

## 2020-07-03 NOTE — H&P (Signed)
Chief Complaint: Patient was seen in consultation today for image guided aspiration of intra-abdominal fluid collection Chief Complaint  Patient presents with   Shortness of Breath   at the request of Dr. Marland Mcalpine, O.   Referring Physician(s): Dr. Marland Mcalpine, O.   Supervising Physician: Malachy Moan  Patient Status: [facility] - In-pt  History of Present Illness: Jamie Carr is a 65 y.o. female with PMH of thyroid disease, essential tremors, epilepsy, who is currently admitted due to acute respiratory failure with hypoxia in the setting of large left pleural effusion/empyema.   Patient presented to ED on 06/30/2020 due to shortness of breath and underwent CT chest without contrast which showed:  Subtotal opacification of the LEFT hemithorax by large pleural effusion with complete atelectasis of LEFT lower lobe and subtotal atelectasis of LEFT upper lobe.  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  Marked narrowing of LEFT lower lobe bronchi with occlusion of LEFT upper lobe bronchus at LEFT hilum suspicious for hilar tumor, difficult to delineate due to adjacent opacified lung and pleural based tumor.  Abnormal soft tissue density in the anterolateral LEFT upper quadrant invading the abdominal wall 3.6 x 2.8 cm consistent with tumor.  Mildly enlarged subcarinal lymph node.  Small pericardial effusion.  Aortic Atherosclerosis (ICD10-I70.0).  Patient was admitted to [facility] and underwent left chest tube placement by critical care on 07/01/2020. The culture grew staph intermedius, which raised a concern of previously seen left upper quadrant abdominal wall mass being an abscess communicating with left empyema.  Patient underwent repeat CT chest without on 07/03/2020 which showed a masslike area along the left anterior abdominal which is favored to represent loculated fluid along the  costophrenic angle as opposed to a discrete chest wall mass.  IR was requested for aspiration of the left anterior abdominal fluid collection.  Patient laying in bed, not in acute distress.  Denise headache, fever, chills, shortness of breath, cough, chest pain, abdominal pain, nausea ,vomiting, and bleeding.   Past Medical History:  Diagnosis Date   Epilepsy (HCC)    Myopia    Presbyopia    Thyroid disease    Tremors of nervous system    essential    Past Surgical History:  Procedure Laterality Date   CESAREAN SECTION  1/93,1/95   LOBECTOMY  06/85   right temporal   TONSILLECTOMY     WISDOM TOOTH EXTRACTION      Allergies: Eggs or egg-derived products, Sulfa antibiotics, and Wheat bran  Medications: Prior to Admission medications   Medication Sig Start Date End Date Taking? Authorizing Provider  Ascorbic Acid (VITAMIN C) 100 MG tablet Take 100 mg by mouth daily.   Yes [provider]  b complex vitamins capsule Take 1 capsule by mouth daily.   Yes [provider]  carvedilol (COREG) 3.125 MG tablet SMARTSIG:1 Tablet(s) By Mouth 06/14/20  Yes [provider]  cholecalciferol (VITAMIN D) 1000 UNITS tablet Take 10,000 Units by mouth daily.   Yes [provider]  estradiol (VIVELLE-DOT) 0.05 MG/24HR patch 1 patch every 3 (three) days. 04/26/20  Yes [provider]  fluticasone (FLONASE) 50 MCG/ACT nasal spray Place into both nostrils daily.   Yes [provider]  HYDROMET 5-1.5 MG/5ML syrup SMARTSIG:1.5 Teaspoon By Mouth Every Night 06/18/20  Yes [provider]  ibuprofen (ADVIL) 200 MG tablet Take 200 mg by mouth every 6 (six) hours as needed.  Yes [provider]  levocetirizine (XYZAL) 5 MG tablet Take 5 mg by mouth every evening.   Yes [provider]  clindamycin (CLEOCIN) 300 MG capsule Take 1 capsule (300 mg total) by mouth 3 (three) times daily. Patient not taking: No sig reported 09/04/13    Teressa Lower, NP  co-enzyme Q-10 30 MG capsule Take 30 mg by mouth 3 (three) times daily.    [provider]  DIGESTIVE ENZYMES PO Take by mouth.    [provider]  HYDROcodone-acetaminophen (NORCO/VICODIN) 5-325 MG per tablet Take 1-2 tablets by mouth every 6 (six) hours as needed. Patient not taking: No sig reported 09/04/13   Teressa Lower, NP  hydroxyurea (HYDREA) 500 MG capsule Take 500 mg by mouth daily. Patient not taking: No sig reported 06/22/20   [provider]  ibuprofen (ADVIL,MOTRIN) 800 MG tablet Take 1 tablet (800 mg total) by mouth 3 (three) times daily. Patient not taking: No sig reported 09/04/13   Teressa Lower, NP  L-TYROSINE PO Take 50 mg by mouth.    [provider]  Multiple Minerals-Vitamins (CALCIUM & VIT D3 BONE HEALTH PO) Take by mouth.    [provider]  progesterone (PROMETRIUM) 100 MG capsule Take 200 mg by mouth at bedtime. 06/07/20   [provider]  thiamine (VITAMIN B-1) 100 MG tablet Take 100 mg by mouth daily. Patient not taking: No sig reported    [provider]     Family History  Problem Relation Age of Onset   Diabetes Paternal Grandmother    Nephrolithiasis Father    Migraines Mother    Hypertension Mother    Deep vein thrombosis Mother    Stroke Mother    Nephrolithiasis Mother     Social History   Socioeconomic History   Marital status: Married    Spouse name: Not on file   Number of children: Not on file   Years of education: Not on file   Highest education level: Not on file  Occupational History   Not on file  Tobacco Use   Smoking status: Never   Smokeless tobacco: Never  Substance and Sexual Activity   Alcohol use: No   Drug use: No   Sexual activity: Yes    Birth control/protection: Post-menopausal  Other Topics Concern   Not on file  Social History Narrative   Not on file   Social Determinants of Health   Financial Resource Strain: Not on  file  Food Insecurity: Not on file  Transportation Needs: Not on file  Physical Activity: Not on file  Stress: Not on file  Social Connections: Not on file     Review of Systems: A 12 point ROS discussed and pertinent positives are indicated in the HPI above.  All other systems are negative.   Vital Signs: BP 112/69 (BP Location: Right Arm)   Pulse 95   Temp 98.1 F (36.7 C) (Oral)   Resp (!) 24   Ht  (1.575 m)   Wt 127 lb 13.9 oz (58 kg)   SpO2 100%   BMI 23.39 kg/m   Physical Exam Vitals reviewed.  Constitutional:      General: She is not in acute distress. HENT:     Head: Normocephalic and atraumatic.  Cardiovascular:     Rate and Rhythm: Normal rate and regular rhythm.  Pulmonary:     Effort: Pulmonary effort is normal.     Comments: Decreased breath sound on LLL Abdominal:  General: Bowel sounds are normal.     Palpations: Abdomen is soft.  Skin:    Comments: Positive for left CT   Neurological:     Mental Status: She is alert and oriented to person, place, and time.  Psychiatric:        Mood and Affect: Mood normal.        Behavior: Behavior normal.    MD Evaluation Airway: WNL Heart: WNL Abdomen: WNL Chest/ Lungs: WNL ASA  Classification: 3 Mallampati/Airway Score: Two  Imaging: CT CHEST WO CONTRAST  Result Date: 07/03/2020 CLINICAL DATA:  Empyema EXAM: CT CHEST WITHOUT CONTRAST TECHNIQUE: Multidetector CT imaging of the chest was performed following the standard protocol without IV contrast. COMPARISON:  Chest CT June 30, 2020 and multiple prior chest radiographs FINDINGS: Cardiovascular: Aortic atherosclerosis without aneurysmal dilation. Normal size heart. Small pericardial effusion. Mediastinum/Nodes: Hypodense 11 mm left thyroid nodule. Not clinically significant; no follow-up imaging recommended (ref: J Am Coll Radiol. 2015 Feb;12(2): 143-50).Multiple prominent to mildly enlarged lower cervical, mediastinal and left axillary lymph nodes  are again visualized and appears stable for instance the previously described 13 mm subcarinal lymph node measures 12 mm in short axis on image 50/2 and a left cardiophrenic lymph node measuring 13 mm on image 102/2. No significant mediastinal shift. The trachea and esophagus are unremarkable. Lungs/Pleura: There is a left lateral approach thoracostomy tube extending into the posterior aspect of the left hemithorax with tip located in the major fissure. Small moderate left pneumothorax with incomplete re-expansion of the left lung extensive left-sided nodular pleural thickening including a thick rind of nodular thickening involving the visceral pleura of the left lung. Similar appearance of the mass like area along the left anterior abdominal wall measuring 4.0 x 3.5 cm on image 129/2, which on sagittal image 118/7 appears to represent loculated fluid along the costophrenic angle as opposed to a discrete chest wall mass. Multifocal consolidations, nodularity and ground-glass opacities with septal thickening throughout the left lung. Upper Abdomen: Trace upper abdominal, predominantly perihepatic, free fluid. Musculoskeletal: Subcutaneous emphysema in the left chest wall. Bilateral breast calcifications. No acute osseous abnormality. Multifocal degenerative changes spine. IMPRESSION: 1. Left lateral approach thoracostomy tube with tip located in the major fissure, with a small moderate left pneumothorax. There is extensive nodular thickening of the parietal and visceral pleura throughout the left hemithorax with incomplete re-expansion of the left lung suggestive of the entrapment. 2. Similar appearance of the mass like area along the left anterior abdominal measuring 4 cm which is favored to represent loculated fluid along the costophrenic angle as opposed to a discrete chest wall mass. 3. Multifocal consolidations, nodularity and ground-glass opacities with septal thickening throughout the left lung. 4. Prominent  to mildly enlarged lower cervical, mediastinal, and left axillary lymph nodes are again visualized and appears stable. 5. Trace upper abdominal, predominantly perihepatic, free fluid. 6. Diffuse subcutaneous edema with left chest wall subcutaneous emphysema. 7. Aortic atherosclerosis. Aortic Atherosclerosis (ICD10-I70.0). Electronically Signed   By: Maudry Mayhew MD   On: 07/03/2020 09:36   CT Chest Wo Contrast  Result Date: 06/30/2020 CLINICAL DATA:  Fever, cough, shortness of breath for 8-10 days, LEFT chest and rib pain, chest x-ray with pleural effusion EXAM: CT CHEST WITHOUT CONTRAST TECHNIQUE: Multidetector CT imaging of the chest was performed following the standard protocol without IV contrast. Sagittal and coronal MPR images reconstructed from axial data set. COMPARISON:  None FINDINGS: Cardiovascular: Small pericardial effusion. Heart otherwise unremarkable. Aorta normal caliber. Minimal atherosclerotic  calcification of aorta and coronary arteries. Mediastinum/Nodes: 11 mm LEFT thyroid nodule; Not clinically significant; no follow-up imaging recommended (ref: J Am Coll Radiol. 2015 Feb;12(2): 143-50).Esophagus unremarkable. Multiple though normal sized mediastinal lymph nodes. Mildly enlarged subcarinal lymph node 13 mm short axis image 56. Slight mediastinal shift LEFT to RIGHT. 7 mm inferior RIGHT cervical lymph node image 10. Lungs/Pleura: Subtotal opacification of the LEFT hemithorax by large pleural effusion. 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 Complete atelectasis of LEFT lower lobe with subtotal atelectasis of LEFT upper lobe. Occlusion of LEFT upper lobe bronchus at LEFT hilum. Marked narrowing of LEFT lower lobe bronchi. Findings suggest presence of tumor at the LEFT hilum though this is difficult to differentiate from adjacent atelectatic central lung and pleural based soft tissue in  LEFT hemithorax. Upper Abdomen: Question small cyst at medial upper LEFT kidney 13 mm diameter. Abnormal soft tissue density in the anterolateral LEFT upper quadrant invading the abdominal wall 3.6 x 2.8 cm image 130 consistent with tumor. Musculoskeletal: No acute osseous findings. IMPRESSION: Subtotal opacification of the LEFT hemithorax by large pleural effusion with complete atelectasis of LEFT lower lobe and subtotal atelectasis of LEFT upper lobe. 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 Marked narrowing of LEFT lower lobe bronchi with occlusion of LEFT upper lobe bronchus at LEFT hilum suspicious for hilar tumor, difficult to delineate due to adjacent opacified lung and pleural based tumor. Abnormal soft tissue density in the anterolateral LEFT upper quadrant invading the abdominal wall 3.6 x 2.8 cm consistent with tumor. Mildly enlarged subcarinal lymph node. Small pericardial effusion. Aortic Atherosclerosis (ICD10-I70.0). Electronically Signed   By: Ulyses Southward M.D.   On: 06/30/2020 08:04   DG CHEST PORT 1 VIEW  Result Date: 07/03/2020 CLINICAL DATA:  65 year old female with left chest tube placed for large loculated appearing pleural effusion - which returned pus. EXAM: PORTABLE CHEST 1 VIEW COMPARISON:  Portable chest 07/02/2020 and earlier. FINDINGS: Portable AP semi upright view at 0509 hours. Stable left chest tube. Small volume left pneumothorax with circumferential appearing pleural thickening superimposed. Abnormal hazy, reticulonodular opacity throughout the left lung. Mediastinal contours are stable and within normal limits. The right lung remains clear. Visualized tracheal air column is within normal limits. Negative visible bowel gas pattern. Small volume left chest wall subcutaneous gas. No acute osseous abnormality identified. IMPRESSION: 1. Empyema with stable left chest tube. Poor  re-expansion of the left lung which might be related to pneumonia. Small volume left pneumothorax/pleural gas superimposed on diffuse pleural thickening. 2. Right lung remains clear. Electronically Signed   By: Odessa Fleming M.D.   On: 07/03/2020 06:44   DG CHEST PORT 1 VIEW  Result Date: 07/02/2020 CLINICAL DATA:  Shortness of breath EXAM: PORTABLE CHEST 1 VIEW COMPARISON:  July 01, 2020 FINDINGS: Chest tube present on the left with tip directed medially and superiorly, stable. There is a persistent lateral and lateral basilar pneumothorax on the left, marginally larger than 1 day prior. Mild subcutaneous air also noted on the left. There is airspace opacity in the left base with asymmetric pulmonary edema on the left, stable. Right lung is clear. Heart size and pulmonary vascularity are normal. No adenopathy. No bone lesions. IMPRESSION: Chest tube on left, unchanged, with left-sided pneumothorax, marginally larger. No tension component. Airspace opacity consistent with pneumonia left base. Asymmetric pulmonary edema  on the left is stable. Right lung clear. Stable cardiac silhouette. Electronically Signed   By: Bretta BangWilliam  Woodruff III M.D.   On: 07/02/2020 07:54   DG Chest Port 1 View  Result Date: 07/01/2020 CLINICAL DATA:  Status post chest tube placement EXAM: PORTABLE CHEST 1 VIEW COMPARISON:  Film from earlier in the same day. FINDINGS: Previously seen pigtail catheter has been removed and a large bore left-sided chest tube placed. Significant reduction in pleural effusion is seen. A small amount of air is noted within the pleural space related to the catheter placement. Right lung is clear. Patchy infiltrate in the left base is noted. Cardiac shadow is stable. IMPRESSION: Significant reduction in left-sided pleural effusion. Pleural margins are somewhat thickened which corresponds to the pleural density seen on recent CT examination. Left lower lobe infiltrate. Electronically Signed   By: Alcide CleverMark  Lukens M.D.    On: 07/01/2020 12:39   DG CHEST PORT 1 VIEW  Result Date: 07/01/2020 CLINICAL DATA:  Empyema, chest tube EXAM: PORTABLE CHEST 1 VIEW COMPARISON:  CT 06/30/2020, radiograph 06/30/2020 FINDINGS: Left pigtail pleural catheter is noted with possible kinking at the insertion site along the lateral chest wall. Increasing size of a loculated left pleural effusion with extensive heterogeneous opacity throughout the residually aerated portions of the left lung likely reflecting combination of passive atelectatic change and possible underlying airspace disease. Right lung is predominantly clear. No right effusion. No visible pneumothorax. Telemetry leads and support devices overlie the chest. No acute osseous or soft tissue abnormality. IMPRESSION: Left pigtail catheter remains in place with some possible kinking at the insertion site along the left chest wall. Increasing size of the loculated left pleural effusion. Heterogeneous opacity within the residually aerated portions of the left lung likely reflect a combination of airspace disease and passive atelectasis. These results will be called to the ordering clinician or representative by the Radiologist Assistant, and communication documented in the PACS or [employer] Dashboard. Electronically Signed   By: Kreg ShropshirePrice  DeHay M.D.   On: 07/01/2020 06:32   DG Chest Port 1 View  Result Date: 06/30/2020 CLINICAL DATA:  Status post chest tube placement. EXAM: PORTABLE CHEST 1 VIEW COMPARISON:  Chest radiograph and CT 06/30/2020 FINDINGS: A left-sided chest tube has been placed terminating over the mid lung. There is a moderate residual left pleural effusion which appears partially loculated. Pleural fluid has decreased significantly following chest tube placement with partial re-expansion of the left lung. There is abnormal density in the left hilar and infrahilar regions as noted on CT. The right lung remains clear. No pneumothorax is identified. Mild thoracic dextroscoliosis is  noted. IMPRESSION: Interval chest tube placement with decreased size of left pleural effusion and partial re-expansion of the left lung. Electronically Signed   By: Sebastian AcheAllen  Grady M.D.   On: 06/30/2020 10:37   DG Chest Portable 1 View  Result Date: 06/30/2020 CLINICAL DATA:  SOB EXAM: PORTABLE CHEST 1 VIEW COMPARISON:  None. FINDINGS: Obscured cardiomediastinal silhouette, mildly shifted towards the right. There is complete opacification of the left hemithorax. The right lung is clear. There is no visible pneumothorax. No acute osseous abnormality. IMPRESSION: Complete opacification of the left hemithorax, favored to be due to a large pleural effusion. Slight rightward mediastinal shift which can be seen with tension physiology. These results were called by telephone at the time of interpretation on 06/30/2020 at 7:13am to provider [facility]OURT[facility] HORTON , who verbally acknowledged these results. Electronically Signed   By: Caprice RenshawJacob  Kahn  On: 06/30/2020 07:15   ECHOCARDIOGRAM COMPLETE  Result Date: 07/03/2020    ECHOCARDIOGRAM REPORT   Patient Name:   Jamie Carr Date of Exam: 07/03/2020 Medical Rec #:  [id number]  Height:       62.0 in Accession #:    [id number] Weight:       127.9 lb Date of Birth:  1955-03-07   BSA:          1.581 m Patient Age:    65 years   BP:           118/61 mmHg Patient Gender: F          HR:           93 bpm. Exam Location:  Inpatient Procedure: 2D Echo, Cardiac Doppler and Color Doppler Indications:    Dyspnea R06.00  History:        Patient has no prior history of Echocardiogram examinations.                 Thyroid Disease.  Sonographer:    Elmarie Shiley Dance Referring Phys: 67 PREETHA JOSEPH  Sonographer Comments: No apical window. IMPRESSIONS  1. Left ventricular ejection fraction, by estimation, is 60 to 65%. The left ventricle has normal function. The left ventricle has no regional wall motion abnormalities. Left ventricular diastolic function could not be evaluated.  2. Right ventricular  systolic function is normal. The right ventricular size is normal.  3. The mitral valve is normal in structure. No evidence of mitral valve regurgitation. No evidence of mitral stenosis.  4. The aortic valve is grossly normal. Aortic valve regurgitation is not visualized. No aortic stenosis is present. FINDINGS  Left Ventricle: Left ventricular ejection fraction, by estimation, is 60 to 65%. The left ventricle has normal function. The left ventricle has no regional wall motion abnormalities. The left ventricular internal cavity size was normal in size. There is  no left ventricular hypertrophy. Left ventricular diastolic function could not be evaluated. Right Ventricle: The right ventricular size is normal. No increase in right ventricular wall thickness. Right ventricular systolic function is normal. Left Atrium: Left atrial size was normal in size. Right Atrium: Right atrial size was normal in size. Pericardium: There is no evidence of pericardial effusion. Mitral Valve: The mitral valve is normal in structure. No evidence of mitral valve regurgitation. No evidence of mitral valve stenosis. Tricuspid Valve: The tricuspid valve is normal in structure. Tricuspid valve regurgitation is trivial. Aortic Valve: The aortic valve is grossly normal. Aortic valve regurgitation is not visualized. No aortic stenosis is present. Pulmonic Valve: The pulmonic valve was not well visualized. Pulmonic valve regurgitation is not visualized. Aorta: The aortic root and ascending aorta are structurally normal, with no evidence of dilitation. IAS/Shunts: The atrial septum is grossly normal.  LEFT VENTRICLE PLAX 2D LVIDd:         3.70 cm LVIDs:         2.60 cm LV PW:         1.00 cm LV IVS:        0.90 cm LVOT diam:     2.20 cm LVOT Area:     3.80 cm  IVC IVC diam: 1.70 cm LEFT ATRIUM         Index LA diam:    2.80 cm 1.77 cm/m   AORTA Ao Root diam: 3.10 cm Ao Asc diam:  3.30 cm  SHUNTS Systemic Diam: 2.20 cm Kristeen Miss MD  Electronically signed by Kristeen Miss MD Signature  Date/Time: 07/03/2020/3:12:43 PM    Final     Labs:  CBC: Recent Labs    06/30/20 1922 07/01/20 0246 07/02/20 0257 07/03/20 0457  WBC 23.0* 21.7* 21.5* 13.7*  HGB 10.6* 9.4* 10.0* 9.8*  HCT 31.6* 28.4* 30.7* 30.3*  PLT 620* 571* 618* 627*    COAGS: No results for input(s): INR, APTT in the last 8760 hours.  BMP: Recent Labs    06/30/20 0625 06/30/20 1922 07/01/20 0246 07/02/20 0257 07/03/20 0457  NA 132*  --  132* 132* 135  K 3.7  --  3.5 4.4 4.0  CL 99  --  103 103 103  CO2 20*  --  21* 21* 24  GLUCOSE 118*  --  106* 79 96  BUN 36*  --  33* 21 13  CALCIUM 10.7*  --  8.9 9.3 9.0  CREATININE 0.88 0.90 0.78 0.73 0.68  GFRNONAA >60 >60 >60 >60 >60    LIVER FUNCTION TESTS: Recent Labs    07/01/20 0246 07/02/20 0257 07/03/20 0457  BILITOT 0.7 0.9 0.6  AST 32 27 39  ALT 35 33 34  ALKPHOS 141* 142* 119  PROT 5.4* 5.8* 5.4*  ALBUMIN 1.8* 1.8* 1.7*    TUMOR MARKERS: No results for input(s): AFPTM, CEA, CA199, CHROMGRNA in the last 8760 hours.  Assessment and Plan: 65 y.o. female with left pleural effusion/empyema s/p left chest tube placement by critical care.  There was an abnormal soft tissue density in the left upper quadrant invading the abdominal wall consistent with tumor seen on CT chest without on 06/30/2020.  Culture grew staph intermedius which raised a concern of previously seen left upper quadrant abdominal wall mass being an abscess communicating with left empyema.  Repeat CT chest without on 07/03/2020 showed the masslike area along the left anterior abdominal favored to represent loculated fluid along the costophrenic angle as opposed to a discrete chest wall mass.  IR was requested for image guided aspiration of the left upper quadrant intra-abdominal fluid collection. Case was reviewed and approved by Dr. Archer Asa. The intra-abdominal fluid collection is located superficially, the procedure will  be low risk for bleeding. Patient last ate around 12pm --the procedure will be attempted with half of the sedation medication only to prevent nausea and vomiting associated with sedation. Lovenox given 2300 hrs 07/02/2020  Risks and benefits discussed with the patient including bleeding, infection, damage to adjacent structures, bowel perforation/fistula connection, and sepsis.  All of the patient's questions were answered, patient is agreeable to proceed. Consent signed and in chart.  Thank you for this interesting consult.  I greatly enjoyed meeting Jamie Carr and look forward to participating in their care.  A copy of this report was sent to the requesting provider on this date.  Electronically Signed: Willette Brace, PA-C 07/03/2020, 3:44 PM   I spent a total of 20 Minutes   in face to face in clinical consultation, greater than 50% of which was counseling/coordinating care for [id number] [phone] intra-abdominal fluid [phone].

## 2020-07-04 ENCOUNTER — Inpatient Hospital Stay (HOSPITAL_COMMUNITY): Payer: BC Managed Care – PPO

## 2020-07-04 LAB — COMPREHENSIVE METABOLIC PANEL
ALT: 44 U/L (ref 0–44)
AST: 50 U/L — ABNORMAL HIGH (ref 15–41)
Albumin: 1.9 g/dL — ABNORMAL LOW (ref 3.5–5.0)
Alkaline Phosphatase: 111 U/L (ref 38–126)
Anion gap: 11 (ref 5–15)
BUN: 10 mg/dL (ref 8–23)
CO2: 25 mmol/L (ref 22–32)
Calcium: 8.1 mg/dL — ABNORMAL LOW (ref 8.9–10.3)
Chloride: 98 mmol/L (ref 98–111)
Creatinine, Ser: 0.66 mg/dL (ref 0.44–1.00)
GFR, Estimated: >60 mL/min (ref >60)
Glucose, Bld: 92 mg/dL (ref 70–99)
Potassium: 3 mmol/L — ABNORMAL LOW (ref 3.5–5.1)
Sodium: 134 mmol/L — ABNORMAL LOW (ref 135–145)
Total Bilirubin: 0.6 mg/dL (ref 0.3–1.2)
Total Protein: 5.4 g/dL — ABNORMAL LOW (ref 6.5–8.1)

## 2020-07-04 LAB — CBC WITH DIFFERENTIAL/PLATELET
Abs Immature Granulocytes: 0.72 10*3/uL — ABNORMAL HIGH (ref 0.00–0.07)
Basophils Absolute: 0.1 10*3/uL (ref 0.0–0.1)
Basophils Relative: 1 %
Eosinophils Absolute: 0 10*3/uL (ref 0.0–0.5)
Eosinophils Relative: 0 %
HCT: 30.8 % — ABNORMAL LOW (ref 36.0–46.0)
Hemoglobin: 9.9 g/dL — ABNORMAL LOW (ref 12.0–15.0)
Immature Granulocytes: 5 %
Lymphocytes Relative: 11 %
Lymphs Abs: 1.4 10*3/uL (ref 0.7–4.0)
MCH: 31 pg (ref 26.0–34.0)
MCHC: 32.1 g/dL (ref 30.0–36.0)
MCV: 96.6 fL (ref 80.0–100.0)
Monocytes Absolute: 1 10*3/uL (ref 0.1–1.0)
Monocytes Relative: 7 %
Neutro Abs: 10.1 10*3/uL — ABNORMAL HIGH (ref 1.7–7.7)
Neutrophils Relative %: 76 %
Platelets: 678 10*3/uL — ABNORMAL HIGH (ref 150–400)
RBC: 3.19 MIL/uL — ABNORMAL LOW (ref 3.87–5.11)
RDW: 13.8 % (ref 11.5–15.5)
WBC: 13.3 10*3/uL — ABNORMAL HIGH (ref 4.0–10.5)
nRBC: 0 % (ref 0.0–0.2)

## 2020-07-04 LAB — MAGNESIUM: Magnesium: 1.8 mg/dL (ref 1.7–2.4)

## 2020-07-04 LAB — GLUCOSE, CAPILLARY
Glucose-Capillary: 77 mg/dL (ref 70–99)
Glucose-Capillary: 75 mg/dL (ref 70–99)
Glucose-Capillary: 100 mg/dL — ABNORMAL HIGH (ref 70–99)
Glucose-Capillary: 143 mg/dL — ABNORMAL HIGH (ref 70–99)

## 2020-07-04 LAB — PHOSPHORUS: Phosphorus: 2.9 mg/dL (ref 2.5–4.6)

## 2020-07-04 MED ORDER — POTASSIUM CHLORIDE CRYS ER 20 MEQ PO TBCR
40.0000 meq | EXTENDED_RELEASE_TABLET | Freq: Two times a day (BID) | ORAL | Status: AC
  Administered 2020-07-04 (×2): 40 meq via ORAL
  Filled 2020-07-04 (×2): qty 2

## 2020-07-04 MED ORDER — OXYCODONE HCL 5 MG PO TABS
5.0000 mg | ORAL_TABLET | Freq: Four times a day (QID) | ORAL | Status: DC | PRN
Start: 2020-07-04 — End: 2020-07-12
  Administered 2020-07-10 – 2020-07-11 (×2): 5 mg via ORAL
  Filled 2020-07-04 (×3): qty 1

## 2020-07-04 MED ORDER — IBUPROFEN 200 MG PO TABS
400.0000 mg | ORAL_TABLET | Freq: Four times a day (QID) | ORAL | Status: DC | PRN
  Administered 2020-07-04 – 2020-07-15 (×23): 400 mg via ORAL
  Filled 2020-07-04 (×24): qty 2

## 2020-07-04 NOTE — Progress Notes (Signed)
PROGRESS NOTE    Jamie Carr  ZOX:[id number] DOB: 07/15/1955 DOA: 06/30/2020 PCP: Henreitta LeberPowell, [place], PA-C   Brief Narrative:  The patient is a 65 year old thin Caucasian female with a past medical history significant for but not limited to prior thyroid disease and tremors as well as other comorbidities who presented to the ED with ongoing dyspnea for 2 to 3 weeks, cough and left-sided chest pain.  Her symptoms progressively started worsening and she was [facility] in [facility] and initial evaluation she is found to be tachypneic with a chest x-ray and chest CT noting complete opacification of the left hemipneumothorax.  CT noted a large left pleural effusion with concern for tumor at the hilum and pleural-based mass.  The case was discussed with pulmonary and the decision was made to transfer the patient to [facility] for further evaluation management.  In the ED she had a left-sided chest tube placed and 1 L was drained and this was and subsequently sent to the hospital.  Of note her Pleurx catheter stopped working and had to be exchanged out by pulmonary today and subsequently she was complaining of pain so she was placed on Precedex drip and became hypotensive.  Pulmonary is managing closely with a low threshold to start pressors subsequently improved and was weaned to room air.   Repeat CT Scan done and showed Abdominal Fluid Collection. IR consulted drained the fluid. ID consulted for further evaluation and recommendations and they are recommending at least 4 weeks of antibiotics and a prolonged treatment.  They recommended continuing Unasyn for the time being and finding an oral equivalent at discharge.  Given the pleural mass noted on the CT scan ID recommended obtaining a CT surgery evaluation for further management and asked them to weigh in if she needs a VATS for decortication of the thick rind from infection; I spoke with Dr. Cornelius Moraswen extensively today and he felt that there is no role for CT  surgery involvement in the patient's case and states that at some point she may need a biopsy but at this point she will not need anything as she is a pleural drain in place which is draining appropriately.  He recommended if she needed a biopsy doing a bronchoscopy biopsy but he felt that she was not improving on antibiotics and recommended medical treatment.  Assessment & Plan:   Principal Problem:   Pleural effusion Active Problems:   Malnutrition of moderate degree   Empyema (HCC)  Acute Respiratory Failure with hypoxia in the setting of Large Left Sided pleural effusion with concern for underlying malignancy but now the setting of strep intermedius empyema, present on admission -Admitted to the SDU  -Initially suspected malignant pleural effusion however the pleural fluid analysis showed no malignant cells and did show inflammation -Cultures are growing strep intermedius -Patient had a chest tube placement yesterday in the ED however it failed because it failed to drain due to likely blockage and kinking -Will follow up on her pleural fluid cell count, pH, protein, LDH and cytology cultures; pleural fluid color was obtained milky with 16,000 total nucleated cell count and turbid appearance -Pulmonary was consulted for further assistance and evaluation -Because of fevers and leukocytosis of empirically added IV ceftriaxone and azithromycin -Pulmonary Dr. Katrinka BlazingSmith reevaluated and pulled initial chest tube and replaced it and now it is draining appropriately -WBC went from 29.3 -> 23.0 -> 21.7 -> 13.7 and is further improved to 13.7 -Repeat CXR yesterday Afternoon showed "Significant reduction  in left-sided pleural effusion. Pleural margins are somewhat thickened which corresponds to the pleural density seen on recent CT examination.  Left lower lobe infiltrate" -Repeat chest x-ray today shows possible reexpansion edema and a LI of the left lung and pneumothorax ex vacuo; her chest tube is still  draining pus and Down but the cultures from it is growing Streptococcus intermedius which is pansensitive so she was de-escalated to IV Unasyn from IV ceftriaxone -SpO2: 98 % O2 Flow Rate (L/min): 4 L/min; now is off of supplemental oxygen via nasal cannula -Continuous pulse oximetry maintain O2 saturation greater 90% -Because of the patient discomfort she was placed on a Precedex drip by pulmonary and will continue per their recommendations -Continue supplemental oxygen via nasal cannula and wean O2 as tolerated; pulmonary recommending continue chest tube and wean FiO2 for pulse ox greater than 88 --Repeat CT Scan showed "Left lateral approach thoracostomy tube with tip located in the major fissure, with a small moderate left pneumothorax. There is extensive nodular thickening of the parietal and visceral pleura throughout the left hemithorax with incomplete re-expansion of the left lung suggestive of the entrapment.  Similar appearance of the mass like area along the left anterior abdominal measuring 4 cm which is favored  to represent loculated fluid along the costophrenic angle as opposed to a discrete chest wall mass. Multifocal consolidations, nodularity and ground-glass opacities with septal thickening throughout the left lung. Prominent to mildly enlarged lower cervical, mediastinal, and left axillary lymph nodes are again visualized and appears stable. Trace upper abdominal, predominantly perihepatic, free fluid.  Diffuse subcutaneous edema with left chest wall subcutaneous emphysema. Aortic atherosclerosis." -Because of her empyema pulmonary recommending evaluating for head and neck origin especially all infections and they are getting an echocardiogram given the rare incidence of endocarditis but also the negative CT of the chest to assess if she needs intrapleural lytics but Pulmonary reviewed  Scan and will hold off intrapleural Lytics.  -ID consulted for further evaluation and Recc's; ID  recommending a prolonged treatment with 4 weeks of antibiotics and continue on ampicillin sulbactam for the time being and finding an oral equivalent the closest time of discharge -ID recommended also obtaining a CT surgery consult I spoke with Dr. Cornelius Moras.  I spoke with Dr. Cornelius Moras extensively today and he felt that there is no role for CT surgery involvement in the patient's case and states that at some point she may need a biopsy but at this point she will not need anything as she is a pleural drain in place which is draining appropriately.  He recommended if she needed a biopsy doing a bronchoscopy biopsy but he felt that she was not improving on antibiotics and recommended medical treatment.  Hypotension -In the setting of the Precedex drip, now improved -Pulmonary is providing boluses for the patient -Continue to monitor blood pressures per protocol -Blood pressures have now stabilized and last blood pressure was 122/69  Normocytic Anemia -Anemia panel was checked and showed an iron level of 20, U IBC 163, TIBC 183 saturation ratios of 11%, ferritin level 912, folate level 12.0 and vitamin B12 of 926 -Patient's Hgb/Hct went from 10.5/30.6 -> 10.6/31.6 -> 9.4/28.4  -> 10.0/30.7 -> 9.8/30.3 and is now 9.9/30.8 -Continue to monitor for signs and symptoms of bleeding; currently no overt bleeding noted For repeat CBC in a.m.  Thrombocytosis -Likely Reactive from Above in setting of empyema -Patient's Platelet Count went from 635 -> 620 -> 571 -> 618 -> 627 and today  is 678 -Continue to Monitor and Trend -Repeat CBC in the AM  Hyponatremia -Patient's Na+ was 132 and improved to 135 yesterday and today it is 134 -Initially there was a concern for malignancy however now is in the setting of empyema -Continue to Monitor and Trend -Repeat CMP in the AM  Hypomagnesemia -Replete. Mag Level is now 1.8 -Continue to Monitor and Replete as Necessary -Repeat CMP in the AM   Hypokalemia -Mild.  Patient's K+ was 3.0 -Replete with po Kcl 40 mEQ BID -Continue to Monitor and Replete as Necessary  -Repeat CMP in the AM   Metabolic Acidosis -Mild and improved. Patient's CO2 is now 25, AG is 11, and Chloride Level is 103 -Continue to Monitor and Trend -Repeat CMP in the AM   Abnormal EKG -Was concerning for pericardial effusion -Echocardiogram and showed Normal EF of 60-65%  Left hilar mass and left pleural soft tissue mass with concern for malignancy however this is now a Streptococcus intermedius empyema -She had a pending pleural fluid studies which we will need to follow cytology for -Pulmonary recommending repeating chest CT once effusion is resolved for better characterization of chest possible masses -Per pulmonary if pleural fluid remains unremarkable/indeterminate may need a bronchoscopy for biopsy -Repeat CT Scan showed "Left lateral approach thoracostomy tube with tip located in the major fissure, with a small moderate left pneumothorax. There is extensive nodular thickening of the parietal and visceral pleura throughout the left hemithorax with incomplete re-expansion of the left lung suggestive of the entrapment.  Similar appearance of the mass like area along the left anterior abdominal measuring 4 cm which is favored  to represent loculated fluid along the costophrenic angle as opposed to a discrete chest wall mass. Multifocal consolidations, nodularity and ground-glass opacities with septal thickening throughout the left lung. Prominent to mildly enlarged lower cervical, mediastinal, and left axillary lymph nodes are again visualized and appears stable. Trace upper abdominal, predominantly perihepatic, free fluid.  Diffuse subcutaneous edema with left chest wall subcutaneous emphysema. Aortic atherosclerosis." -ID consulted for further evaluation; they are recommending CT surgery to evaluate the patient to get up tissue biopsy to determine the cause of mass and further  management and have them if she needs a VATS for decortication of thick rind from infection -CT Surgery felt no role for VATS and felt they did not need to consult as her Chest Tube is draining well   Abdominal Loculated Fluid Collection -As above -Obtained IR eval and CT guided Aspiration which sent 10 mL   Non-Severe (Moderate) Malnutrition in the context of Acute Illness/Injury  -Nutritionist consulted for further evaluation and recommendations -The nutritionist team is recommending boost breeze once a day as well as Molli Posey 1.4 p.o. once a day and a multivitamin with minerals daily  DVT prophylaxis: Enoxaparin 40 mg sq q24h Code Status: FULL CODE  Family Communication: No family present at bedside  Disposition Plan: Pending further clearance and evaluation by pulmonary; patient also need PT OT to further evaluate and treat  Status is: Inpatient  Remains inpatient appropriate because:Ongoing diagnostic testing needed not appropriate for outpatient work up, Unsafe d/c plan, IV treatments appropriate due to intensity of illness or inability to take PO, and Inpatient level of care appropriate due to severity of illness  Dispo: The patient is from: Home              Anticipated d/c is to: Home  Patient currently is not medically stable to d/c.   Difficult to place patient No  Consultants:  Pulmonary IR ID Discussed with Dr. Cornelius Moras  Procedures: Chest Tube Insertion  ECHOCARDIOGRAM IMPRESSIONS     1. Left ventricular ejection fraction, by estimation, is 60 to 65%. The  left ventricle has normal function. The left ventricle has no regional  wall motion abnormalities. Left ventricular diastolic function could not  be evaluated.   2. Right ventricular systolic function is normal. The right ventricular  size is normal.   3. The mitral valve is normal in structure. No evidence of mitral valve  regurgitation. No evidence of mitral stenosis.   4. The aortic valve is  grossly normal. Aortic valve regurgitation is not  visualized. No aortic stenosis is present.   FINDINGS   Left Ventricle: Left ventricular ejection fraction, by estimation, is 60  to 65%. The left ventricle has normal function. The left ventricle has no  regional wall motion abnormalities. The left ventricular internal cavity  size was normal in size. There is   no left ventricular hypertrophy. Left ventricular diastolic function  could not be evaluated.   Right Ventricle: The right ventricular size is normal. No increase in  right ventricular wall thickness. Right ventricular systolic function is  normal.   Left Atrium: Left atrial size was normal in size.   Right Atrium: Right atrial size was normal in size.   Pericardium: There is no evidence of pericardial effusion.   Mitral Valve: The mitral valve is normal in structure. No evidence of  mitral valve regurgitation. No evidence of mitral valve stenosis.   Tricuspid Valve: The tricuspid valve is normal in structure. Tricuspid  valve regurgitation is trivial.   Aortic Valve: The aortic valve is grossly normal. Aortic valve  regurgitation is not visualized. No aortic stenosis is present.   Pulmonic Valve: The pulmonic valve was not well visualized. Pulmonic valve  regurgitation is not visualized.   Aorta: The aortic root and ascending aorta are structurally normal, with  no evidence of dilitation.   IAS/Shunts: The atrial septum is grossly normal.      LEFT VENTRICLE  PLAX 2D  LVIDd:         3.70 cm  LVIDs:         2.60 cm  LV PW:         1.00 cm  LV IVS:        0.90 cm  LVOT diam:     2.20 cm  LVOT Area:     3.80 cm      IVC  IVC diam: 1.70 cm   LEFT ATRIUM         Index  LA diam:    2.80 cm 1.77 cm/m      AORTA  Ao Root diam: 3.10 cm  Ao Asc diam:  3.30 cm      SHUNTS  Systemic Diam: 2.20 cm   Antimicrobials:  Anti-infectives (From admission, onward)    Start     Dose/Rate Route Frequency Ordered  Stop   07/03/20 0600  Ampicillin-Sulbactam (UNASYN) 3 g in sodium chloride 0.9 % 100 mL IVPB        3 g 200 mL/hr over 30 Minutes Intravenous Every 6 hours 07/02/20 1409     07/01/20 1045  cefTRIAXone (ROCEPHIN) 2 g in sodium chloride 0.9 % 100 mL IVPB  Status:  Discontinued        2 g 200 mL/hr over 30 Minutes Intravenous Every  24 hours 07/01/20 0947 07/02/20 1409   07/01/20 1000  cefTRIAXone (ROCEPHIN) 1 g in sodium chloride 0.9 % 100 mL IVPB  Status:  Discontinued        1 g 200 mL/hr over 30 Minutes Intravenous Every 24 hours 06/30/20 1723 07/01/20 0947   07/01/20 1000  azithromycin (ZITHROMAX) 500 mg in sodium chloride 0.9 % 250 mL IVPB  Status:  Discontinued        500 mg 250 mL/hr over 60 Minutes Intravenous Every 24 hours 06/30/20 1723 07/02/20 1409   06/30/20 0700  vancomycin (VANCOCIN) IVPB 1000 mg/200 mL premix        1,000 mg 200 mL/hr over 60 Minutes Intravenous  Once 06/30/20 0655 06/30/20 1134   06/30/20 0700  cefTRIAXone (ROCEPHIN) 2 g in sodium chloride 0.9 % 100 mL IVPB        2 g 200 mL/hr over 30 Minutes Intravenous  Once 06/30/20 0655 06/30/20 1017   06/30/20 0700  azithromycin (ZITHROMAX) 500 mg in sodium chloride 0.9 % 250 mL IVPB        500 mg 250 mL/hr over 60 Minutes Intravenous  Once 06/30/20 0655 06/30/20 1030        Subjective: Seen and examined at bedside and she is doing okay.  Had some pain.  No nausea or vomiting but states that her oxygen was not working.  No lightheadedness or dizziness.  No other concerns or complaints at this time.  Objective: Vitals:   07/03/20 1545 07/03/20 1622 07/04/20 0524 07/04/20 1518  BP: 113/61 119/72 125/64 122/69  Pulse: 90 93 96 (!) 102  Resp: (!) 27 20 (!) 24 16  Temp:  98.2 F (36.8 C) 97.8 F (36.6 C) 98.4 F (36.9 C)  TempSrc:  Oral Oral Oral  SpO2: 100% 99% 99% 98%  Weight:      Height:        Intake/Output Summary (Last 24 hours) at 07/04/2020 1819 Last data filed at 07/04/2020 1500 Gross per 24 hour   Intake 540 ml  Output 530 ml  Net 10 ml    Filed Weights   06/30/20 0625 07/02/20 1812  Weight: 55.3 kg 58 kg   Examination: Physical Exam:  Constitutional: Thin cachectic Caucasian female in NAD sitting in the chair bedside appears a little more comfortable  Eyes: Lids and conjunctivae normal, sclerae anicteric  ENMT: External Ears, Nose appear normal. Grossly normal hearing.  Neck: Appears normal, supple, no cervical masses, normal ROM, no appreciable thyromegaly; no JVD Respiratory: Diminished to auscultation bilaterally with coarse breath sounds worse on the left compared to right and some mild rhonchi, no wheezing, rales, or crackles. Normal respiratory effort and patient is not tachypenic. No accessory muscle use.  Wearing supplemental oxygen via nasal cannula Cardiovascular: RRR, no murmurs / rubs / gallops. S1 and S2 auscultated. No extremity edema. 2+ pedal pulses. No carotid bruits.  Abdomen: Soft, non-tender, non-distended. Bowel sounds positive.  GU: Deferred. Musculoskeletal: No clubbing / cyanosis of digits/nails. No joint deformity upper and lower extremities.  Skin: No rashes, lesions, ulcers on limited skin evaluation. No induration; Warm and dry.  Neurologic: CN 2-12 grossly intact with no focal deficits. Romberg sign and cerebellar reflexes not assessed.  Psychiatric: Normal judgment and insight. Alert and oriented x 3. Normal mood and appropriate affect.   Data Reviewed: I have personally reviewed following labs and imaging studies  CBC: Recent Labs  Lab 06/30/20 0625 06/30/20 1922 07/01/20 0246 07/02/20 0257 07/03/20 0457 07/04/20 0550  WBC 29.3* 23.0* 21.7* 21.5* 13.7* 13.3*  NEUTROABS 24.7*  --   --  16.6* 10.2* 10.1*  HGB 10.5* 10.6* 9.4* 10.0* 9.8* 9.9*  HCT 30.6* 31.6* 28.4* 30.7* 30.3* 30.8*  MCV 91.1 93.2 93.7 95.3 95.3 96.6  PLT 635* 620* 571* 618* 627* 678*    Basic Metabolic Panel: Recent Labs  Lab 06/30/20 0625 06/30/20 1922  07/01/20 0246 07/02/20 0257 07/03/20 0457 07/04/20 0550  NA 132*  --  132* 132* 135 134*  K 3.7  --  3.5 4.4 4.0 3.0*  CL 99  --  103 103 103 98  CO2 20*  --  21* 21* 24 25  GLUCOSE 118*  --  106* 79 96 92  BUN 36*  --  33* CREATININE 0.88 0.90 0.78 0.73 0.68 0.66  CALCIUM 10.7*  --  8.9 9.3 9.0 8.1*  MG  --   --   --  1.8 1.9 1.8  PHOS  --   --   --  3.7 3.6 2.9    GFR: Estimated Creatinine Clearance: 56.9 mL/min (by C-G formula based on SCr of 0.66 mg/dL). Liver Function Tests: Recent Labs  Lab 07/01/20 0246 07/02/20 0257 07/03/20 0457 07/04/20 0550  AST 32 27 39 50*  ALT 35 33 34 44  ALKPHOS 141* 142* 119 111  BILITOT 0.7 0.9 0.6 0.6  PROT 5.4* 5.8* 5.4* 5.4*  ALBUMIN 1.8* 1.8* 1.7* 1.9*    No results for input(s): LIPASE, AMYLASE in the last 168 hours. No results for input(s): AMMONIA in the last 168 hours. Coagulation Profile: No results for input(s): INR, PROTIME in the last 168 hours. Cardiac Enzymes: No results for input(s): CKTOTAL, CKMB, CKMBINDEX, TROPONINI in the last 168 hours. BNP (last 3 results) No results for input(s): PROBNP in the last 8760 hours. HbA1C: No results for input(s): HGBA1C in the last 72 hours. CBG: Recent Labs  Lab 07/03/20 1620 07/03/20 2253 07/04/20 0746 07/04/20 1157 07/04/20 1650  GLUCAP 89 93 77 75 100*    Lipid Profile: No results for input(s): CHOL, HDL, LDLCALC, TRIG, CHOLHDL, LDLDIRECT in the last 72 hours. Thyroid Function Tests: No results for input(s): TSH, T4TOTAL, FREET4, T3FREE, THYROIDAB in the last 72 hours. Anemia Panel: Recent Labs    07/02/20 0257  VITAMINB12 926*  FOLATE 12.0  FERRITIN 912*  TIBC 183*  IRON 20*  RETICCTPCT 1.7    Sepsis Labs: Recent Labs  Lab 06/30/20 0737 06/30/20 1114  LATICACIDVEN 1.2 1.3     Recent Results (from the past 240 hour(s))  Resp Panel by RT-PCR (Flu A&B, Covid) Nasopharyngeal Swab     Status: None   Collection Time: 06/30/20  6:42 AM    Specimen: Nasopharyngeal Swab; Nasopharyngeal(NP) swabs in vial transport medium  Result Value Ref Range Status   SARS Coronavirus 2 by RT PCR NEGATIVE NEGATIVE Final    Comment: (NOTE) SARS-CoV-2 target nucleic acids are NOT DETECTED.  The SARS-CoV-2 RNA is generally detectable in upper respiratory specimens during the acute phase of infection. The lowest concentration of SARS-CoV-2 viral copies this assay can detect is 138 copies/mL. A negative result does not preclude SARS-Cov-2 infection and should not be used as the sole basis for treatment or other patient management decisions. A negative result may occur with  improper specimen collection/handling, submission of specimen other than nasopharyngeal swab, presence of viral mutation(s) within the areas targeted by this assay, and inadequate number of viral copies(<138 copies/mL). A negative result must be  combined with clinical observations, patient history, and epidemiological information. The expected result is Negative.  Fact Sheet for Patients:  BloggerCourse.com  Fact Sheet for Healthcare Providers:  SeriousBroker.it  This test is no t yet approved or cleared by the [place] FDA and  has been authorized for detection and/or diagnosis of SARS-CoV-2 by FDA under an Emergency Use Authorization (EUA). This EUA will remain  in effect (meaning this test can be used) for the duration of the COVID-19 declaration under Section 564(b)(1) of the Act, 21 U.S.C.section 360bbb-3(b)(1), unless the authorization is terminated  or revoked sooner.       Influenza A by PCR NEGATIVE NEGATIVE Final   Influenza B by PCR NEGATIVE NEGATIVE Final    Comment: (NOTE) The Xpert Xpress SARS-CoV-2/FLU/RSV plus assay is intended as an aid in the diagnosis of influenza from Nasopharyngeal swab specimens and should not be used as a sole basis for treatment. Nasal washings and aspirates are  unacceptable for Xpert Xpress SARS-CoV-2/FLU/RSV testing.  Fact Sheet for Patients: BloggerCourse.com  Fact Sheet for Healthcare Providers: SeriousBroker.it  This test is not yet approved or cleared by the [place] FDA and has been authorized for detection and/or diagnosis of SARS-CoV-2 by FDA under an Emergency Use Authorization (EUA). This EUA will remain in effect (meaning this test can be used) for the duration of the COVID-19 declaration under Section 564(b)(1) of the Act, 21 U.S.C. section 360bbb-3(b)(1), unless the authorization is terminated or revoked.  Performed at [facility], [address] [place], [place] [address]   Blood culture (routine x 2)     Status: None (Preliminary result)   Collection Time: 06/30/20  7:37 AM   Specimen: Left Antecubital; Blood  Result Value Ref Range Status   Specimen Description   Final    LEFT ANTECUBITAL Performed at [facility], [address] [place], [place] [address]    Special Requests   Final    Blood Culture adequate volume Performed at [facility], [address], [place], [place] [address]    Culture   Final    NO GROWTH 3 DAYS Performed at [facility], [address], [place], [place] [address]    Report Status PENDING  Incomplete  Blood culture (routine x 2)     Status: None (Preliminary result)   Collection Time: 06/30/20  7:42 AM   Specimen: Right Antecubital; Blood  Result Value Ref Range Status   Specimen Description   Final    RIGHT ANTECUBITAL Performed at [facility], [address] [place], [place] [address]    Special Requests   Final    Blood Culture adequate volume Performed at [facility], [address] [place], [place] [address]    Culture   Final    NO GROWTH 3 DAYS Performed at [facility], [address]  [place], [place] [address]    Report Status PENDING  Incomplete  Body fluid culture w Gram Stain     Status: None   Collection Time: 06/30/20 10:01 AM   Specimen: Pleural Fluid  Result Value Ref Range Status   Specimen Description   Final    PLEURAL Performed at [facility], [address] [place], [place] [address]    Special Requests   Final    NONE Performed at [facility], [address], [place], [place] [address]    Gram Stain   Final    ABUNDANT WBC PRESENT,BOTH PMN  AND MONONUCLEAR ABUNDANT GRAM POSITIVE COCCI Performed at [facility], [place], [place] [address]    Culture ABUNDANT STREPTOCOCCUS INTERMEDIUS  Final   Report Status 07/02/2020 FINAL  Final   Organism ID, Bacteria STREPTOCOCCUS INTERMEDIUS  Final      Susceptibility   Streptococcus intermedius - MIC*    PENICILLIN <=0.06 SENSITIVE Sensitive     CEFTRIAXONE 0.25 SENSITIVE Sensitive     ERYTHROMYCIN <=0.12 SENSITIVE Sensitive     LEVOFLOXACIN 0.5 SENSITIVE Sensitive     VANCOMYCIN 0.5 SENSITIVE Sensitive     * ABUNDANT STREPTOCOCCUS INTERMEDIUS  MRSA PCR Screening     Status: None   Collection Time: 06/30/20  4:17 PM   Specimen: Nasopharyngeal  Result Value Ref Range Status   MRSA by PCR NEGATIVE NEGATIVE Final    Comment:        The GeneXpert MRSA Assay (FDA approved for NASAL specimens only), is one component of a comprehensive MRSA colonization surveillance program. It is not intended to diagnose MRSA infection nor to guide or monitor treatment for MRSA infections. Performed at [facility], [address], [place], [place] [address]   Body fluid culture w Gram Stain     Status: None   Collection Time: 07/01/20 12:30 PM   Specimen: Pleura; Body Fluid  Result Value Ref Range Status   Specimen Description   Final    PLEURAL LT Performed at [facility], [address], [place], [place] [address]     Special Requests   Final    NONE Performed at [facility], [address] [place], [place] [address]    Gram Stain   Final    ABUNDANT WBC PRESENT,BOTH PMN AND MONONUCLEAR ABUNDANT GRAM POSITIVE COCCI    Culture   Final    ABUNDANT STREPTOCOCCUS INTERMEDIUS SUSCEPTIBILITIES PERFORMED ON PREVIOUS CULTURE WITHIN THE LAST 5 DAYS. Performed at [facility], [address] [place], [place] [address]    Report Status 07/03/2020 FINAL  Final  Aerobic/Anaerobic Culture w Gram Stain (surgical/deep wound)     Status: None (Preliminary result)   Collection Time: 07/03/20  4:14 PM   Specimen: Abscess  Result Value Ref Range Status   Specimen Description   Final    ABSCESS Performed at [facility], [address] [place], [place] [address]    Special Requests   Final    Normal Performed at [facility], [address], [place], [place] [address]    Gram Stain PENDING  Incomplete   Culture   Final    NO GROWTH < 12 HOURS Performed at [facility], [address], [place], [place] [address]    Report Status PENDING  Incomplete     RN Pressure Injury Documentation:     Estimated body mass index is 23.39 kg/m as calculated from the following:   Height as of this encounter:  (1.575 m).   Weight as of this encounter: 58 kg.  Malnutrition Type:  Nutrition Problem: Moderate Malnutrition Etiology: acute illness   Malnutrition Characteristics:  Signs/Symptoms: mild fat depletion, mild muscle depletion   Nutrition Interventions:  Interventions: Boost Breeze, MVI, Other (Comment) [employer] 1.4)   Radiology Studies: CT CHEST WO CONTRAST  Result Date: 07/03/2020 CLINICAL DATA:  Empyema EXAM: CT CHEST WITHOUT CONTRAST TECHNIQUE: Multidetector CT imaging of the chest was performed following the standard protocol without IV contrast. COMPARISON:  Chest CT June 30, 2020 and multiple prior chest  radiographs FINDINGS: Cardiovascular: Aortic atherosclerosis without aneurysmal dilation. Normal size heart. Small pericardial effusion. Mediastinum/Nodes: Hypodense 11 mm left thyroid nodule. Not clinically significant; no follow-up imaging recommended (ref: J Am Coll Radiol. 2015 Feb;12(2): 143-50).Multiple prominent to mildly enlarged lower cervical, mediastinal and left axillary lymph nodes are again visualized and appears stable for instance the previously described 13 mm subcarinal lymph node measures 12 mm in short axis on image 50/2 and a left cardiophrenic lymph node measuring 13 mm on image 102/2. No significant mediastinal shift. The trachea and esophagus are unremarkable. Lungs/Pleura: There is a left lateral approach thoracostomy tube extending into the posterior aspect of the left hemithorax with tip located in the major fissure. Small moderate left pneumothorax with incomplete re-expansion of the left lung extensive left-sided nodular pleural thickening including a thick rind of nodular thickening involving the visceral pleura of the left lung. Similar appearance of the mass like area along the left anterior abdominal wall measuring 4.0 x 3.5 cm on image 129/2, which on sagittal image 118/7 appears to represent loculated fluid along the costophrenic angle as opposed to a discrete chest wall mass. Multifocal consolidations, nodularity and ground-glass opacities with septal thickening throughout the left lung. Upper Abdomen: Trace upper abdominal, predominantly perihepatic, free fluid. Musculoskeletal: Subcutaneous emphysema in the left chest wall. Bilateral breast calcifications. No acute osseous abnormality. Multifocal degenerative changes spine. IMPRESSION: 1. Left lateral approach thoracostomy tube with tip located in the major fissure, with a small moderate left pneumothorax. There is extensive nodular thickening of the parietal and visceral pleura throughout the left hemithorax with incomplete  re-expansion of the left lung suggestive of the entrapment. 2. Similar appearance of the mass like area along the left anterior abdominal measuring 4 cm which is favored to represent loculated fluid along the costophrenic angle as opposed to a discrete chest wall mass. 3. Multifocal consolidations, nodularity and ground-glass opacities with septal thickening throughout the left lung. 4. Prominent to mildly enlarged lower cervical, mediastinal, and left axillary lymph nodes are again visualized and appears stable. 5. Trace upper abdominal, predominantly perihepatic, free fluid. 6. Diffuse subcutaneous edema with left chest wall subcutaneous emphysema. 7. Aortic atherosclerosis. Aortic Atherosclerosis (ICD10-I70.0). Electronically Signed   By: Maudry Mayhew MD   On: 07/03/2020 09:36   CT ASPIRATION  Result Date: 07/03/2020 INDICATION: 65 year old female with abnormal soft tissue density in the lung left anterior inferior pleural space concerning for pleural based tumor versus extension of empyema. She presents for CT-guided aspiration versus biopsy. EXAM: CT-guided aspiration MEDICATIONS: The patient is currently admitted to the hospital and receiving intravenous antibiotics. The antibiotics were administered within an appropriate time frame prior to the initiation of the procedure. ANESTHESIA/SEDATION: Fentanyl 100 mcg IV; Versed 2 mg IV Moderate Sedation Time:  10 minutes The patient was continuously monitored during the procedure by the interventional radiology nurse under my direct supervision. COMPLICATIONS: None immediate. PROCEDURE: Informed written consent was obtained from the patient after a thorough discussion of the procedural risks, benefits and alternatives. All questions were addressed. Maximal Sterile Barrier Technique was utilized including caps, mask, sterile gowns, sterile gloves, sterile drape, hand hygiene and skin antiseptic. A timeout was performed prior to the initiation of the procedure.  A planning axial CT scan was performed. The fluid collection was successfully identified. The overlying skin was sterilely prepped and draped in the standard fashion using chlorhexidine. Local anesthesia was attained by infiltration with 1% lidocaine. A small dermatotomy was made. An  18 gauge trocar needle was advanced into the structure. There was minimal tactile feedback indicating that this represents fluid rather than solid soft tissue. Aspiration was then performed yielding approximately 10 mL of thick purulent fluid. Repeat imaging demonstrates near complete resolution of the abnormality. IMPRESSION: The CT abnormality corresponds with a pocket of complex purulent fluid. 10 mL was successfully aspirated and sent for culture. No definite soft tissue mass present for biopsy. Electronically Signed   By: Malachy Moan M.D.   On: 07/03/2020 16:22   DG CHEST PORT 1 VIEW  Result Date: 07/04/2020 CLINICAL DATA:  Chest tube.  Follow-up pleural effusion EXAM: PORTABLE CHEST 1 VIEW COMPARISON:  07/03/2020 FINDINGS: Left chest tube remains in place. Left pneumothorax unchanged from the prior study. Diffuse pleural thickening on the left unchanged. Diffuse airspace disease in the left lung is unchanged. Left lower lobe volume loss and consolidation unchanged. Right lung remains clear. IMPRESSION: Mild to moderate left pneumothorax unchanged with diffuse airspace disease in the left lung. Left chest tube remains in place. Electronically Signed   By: Marlan Palau M.D.   On: 07/04/2020 09:43   DG CHEST PORT 1 VIEW  Result Date: 07/03/2020 CLINICAL DATA:  65 year old female with left chest tube placed for large loculated appearing pleural effusion - which returned pus. EXAM: PORTABLE CHEST 1 VIEW COMPARISON:  Portable chest 07/02/2020 and earlier. FINDINGS: Portable AP semi upright view at 0509 hours. Stable left chest tube. Small volume left pneumothorax with circumferential appearing pleural thickening  superimposed. Abnormal hazy, reticulonodular opacity throughout the left lung. Mediastinal contours are stable and within normal limits. The right lung remains clear. Visualized tracheal air column is within normal limits. Negative visible bowel gas pattern. Small volume left chest wall subcutaneous gas. No acute osseous abnormality identified. IMPRESSION: 1. Empyema with stable left chest tube. Poor re-expansion of the left lung which might be related to pneumonia. Small volume left pneumothorax/pleural gas superimposed on diffuse pleural thickening. 2. Right lung remains clear. Electronically Signed   By: Odessa Fleming M.D.   On: 07/03/2020 06:44   ECHOCARDIOGRAM COMPLETE  Result Date: 07/03/2020    ECHOCARDIOGRAM REPORT   Patient Name:   Jamie Carr Date of Exam: 07/03/2020 Medical Rec #:  [id number]  Height:       62.0 in Accession #:    [id number] Weight:       127.9 lb Date of Birth:  04-14-1955   BSA:          1.581 m Patient Age:    65 years   BP:           118/61 mmHg Patient Gender: F          HR:           93 bpm. Exam Location:  Inpatient Procedure: 2D Echo, Cardiac Doppler and Color Doppler Indications:    Dyspnea R06.00  History:        Patient has no prior history of Echocardiogram examinations.                 Thyroid Disease.  Sonographer:    Elmarie Shiley Dance Referring Phys: 47 PREETHA JOSEPH  Sonographer Comments: No apical window. IMPRESSIONS  1. Left ventricular ejection fraction, by estimation, is 60 to 65%. The left ventricle has normal function. The left ventricle has no regional wall motion abnormalities. Left ventricular diastolic function could not be evaluated.  2. Right ventricular systolic function is normal. The right ventricular size is normal.  3. The  mitral valve is normal in structure. No evidence of mitral valve regurgitation. No evidence of mitral stenosis.  4. The aortic valve is grossly normal. Aortic valve regurgitation is not visualized. No aortic stenosis is present. FINDINGS  Left  Ventricle: Left ventricular ejection fraction, by estimation, is 60 to 65%. The left ventricle has normal function. The left ventricle has no regional wall motion abnormalities. The left ventricular internal cavity size was normal in size. There is  no left ventricular hypertrophy. Left ventricular diastolic function could not be evaluated. Right Ventricle: The right ventricular size is normal. No increase in right ventricular wall thickness. Right ventricular systolic function is normal. Left Atrium: Left atrial size was normal in size. Right Atrium: Right atrial size was normal in size. Pericardium: There is no evidence of pericardial effusion. Mitral Valve: The mitral valve is normal in structure. No evidence of mitral valve regurgitation. No evidence of mitral valve stenosis. Tricuspid Valve: The tricuspid valve is normal in structure. Tricuspid valve regurgitation is trivial. Aortic Valve: The aortic valve is grossly normal. Aortic valve regurgitation is not visualized. No aortic stenosis is present. Pulmonic Valve: The pulmonic valve was not well visualized. Pulmonic valve regurgitation is not visualized. Aorta: The aortic root and ascending aorta are structurally normal, with no evidence of dilitation. IAS/Shunts: The atrial septum is grossly normal.  LEFT VENTRICLE PLAX 2D LVIDd:         3.70 cm LVIDs:         2.60 cm LV PW:         1.00 cm LV IVS:        0.90 cm LVOT diam:     2.20 cm LVOT Area:     3.80 cm  IVC IVC diam: 1.70 cm LEFT ATRIUM         Index LA diam:    2.80 cm 1.77 cm/m   AORTA Ao Root diam: 3.10 cm Ao Asc diam:  3.30 cm  SHUNTS Systemic Diam: 2.20 cm Kristeen Miss MD Electronically signed by Kristeen Miss MD Signature Date/Time: 07/03/2020/3:12:43 PM    Final     Scheduled Meds:  chlorhexidine  15 mL Mouth Rinse BID   Chlorhexidine Gluconate Cloth  6 each Topical Daily   docusate  100 mg Per Tube BID   enoxaparin (LOVENOX) injection  40 mg Subcutaneous Q24H   feeding supplement  1  Container Oral Q24H   feeding supplement (KATE [employer] STANDARD 1.4)  325 mL Oral Daily   loratadine  10 mg Oral QHS   mouth rinse  15 mL Mouth Rinse q12n4p   multivitamin with minerals  1 tablet Oral Daily   polyethylene glycol  17 g Per Tube Daily   potassium chloride  40 mEq Oral BID   vitamin C  250 mg Oral Daily   Continuous Infusions:  sodium chloride     ampicillin-sulbactam (UNASYN) IV 3 g (07/04/20 1739)    LOS: 4 days   Merlene Laughter, DO [facility] PAGER is on AMION  If 7PM-7AM, please contact night-coverage [email or web address]

## 2020-07-05 ENCOUNTER — Inpatient Hospital Stay (HOSPITAL_COMMUNITY): Payer: BC Managed Care – PPO

## 2020-07-05 DIAGNOSIS — A491 Streptococcal infection, unspecified site: Secondary | ICD-10-CM

## 2020-07-05 DIAGNOSIS — J9601 Acute respiratory failure with hypoxia: Secondary | ICD-10-CM | POA: Diagnosis not present

## 2020-07-05 DIAGNOSIS — J90 Pleural effusion, not elsewhere classified: Secondary | ICD-10-CM | POA: Diagnosis not present

## 2020-07-05 DIAGNOSIS — E440 Moderate protein-calorie malnutrition: Secondary | ICD-10-CM

## 2020-07-05 LAB — COMPREHENSIVE METABOLIC PANEL
ALT: 48 U/L — ABNORMAL HIGH (ref 0–44)
AST: 53 U/L — ABNORMAL HIGH (ref 15–41)
Albumin: 1.9 g/dL — ABNORMAL LOW (ref 3.5–5.0)
Alkaline Phosphatase: 92 U/L (ref 38–126)
Anion gap: 6 (ref 5–15)
BUN: 11 mg/dL (ref 8–23)
CO2: 27 mmol/L (ref 22–32)
Calcium: 8.4 mg/dL — ABNORMAL LOW (ref 8.9–10.3)
Chloride: 105 mmol/L (ref 98–111)
Creatinine, Ser: 0.5 mg/dL (ref 0.44–1.00)
GFR, Estimated: >60 mL/min (ref >60)
Glucose, Bld: 99 mg/dL (ref 70–99)
Potassium: 4 mmol/L (ref 3.5–5.1)
Sodium: 138 mmol/L (ref 135–145)
Total Bilirubin: 0.6 mg/dL (ref 0.3–1.2)
Total Protein: 5.2 g/dL — ABNORMAL LOW (ref 6.5–8.1)

## 2020-07-05 LAB — CBC WITH DIFFERENTIAL/PLATELET
Abs Immature Granulocytes: 0.53 10*3/uL — ABNORMAL HIGH (ref 0.00–0.07)
Basophils Absolute: 0.1 10*3/uL (ref 0.0–0.1)
Basophils Relative: 1 %
Eosinophils Absolute: 0 10*3/uL (ref 0.0–0.5)
Eosinophils Relative: 0 %
HCT: 28.2 % — ABNORMAL LOW (ref 36.0–46.0)
Hemoglobin: 9.1 g/dL — ABNORMAL LOW (ref 12.0–15.0)
Immature Granulocytes: 7 %
Lymphocytes Relative: 22 %
Lymphs Abs: 1.7 10*3/uL (ref 0.7–4.0)
MCH: 30.8 pg (ref 26.0–34.0)
MCHC: 32.3 g/dL (ref 30.0–36.0)
MCV: 95.6 fL (ref 80.0–100.0)
Monocytes Absolute: 0.8 10*3/uL (ref 0.1–1.0)
Monocytes Relative: 10 %
Neutro Abs: 4.9 10*3/uL (ref 1.7–7.7)
Neutrophils Relative %: 60 %
Platelets: 540 10*3/uL — ABNORMAL HIGH (ref 150–400)
RBC: 2.95 MIL/uL — ABNORMAL LOW (ref 3.87–5.11)
RDW: 13.6 % (ref 11.5–15.5)
WBC: 8 10*3/uL (ref 4.0–10.5)
nRBC: 0 % (ref 0.0–0.2)

## 2020-07-05 LAB — CULTURE, BLOOD (ROUTINE X 2)
Culture: NO GROWTH
Culture: NO GROWTH
Special Requests: ADEQUATE
Special Requests: ADEQUATE

## 2020-07-05 LAB — GLUCOSE, CAPILLARY
Glucose-Capillary: 87 mg/dL (ref 70–99)
Glucose-Capillary: 84 mg/dL (ref 70–99)
Glucose-Capillary: 85 mg/dL (ref 70–99)
Glucose-Capillary: 93 mg/dL (ref 70–99)

## 2020-07-05 LAB — MAGNESIUM: Magnesium: 1.8 mg/dL (ref 1.7–2.4)

## 2020-07-05 LAB — PHOSPHORUS: Phosphorus: 3.7 mg/dL (ref 2.5–4.6)

## 2020-07-05 MED ORDER — MAGNESIUM SULFATE 2 GM/50ML IV SOLN
2.0000 g | Freq: Once | INTRAVENOUS | Status: AC
  Administered 2020-07-05: 2 g via INTRAVENOUS
  Filled 2020-07-05: qty 50

## 2020-07-05 NOTE — Progress Notes (Signed)
ID Brief Note  I recommended getting a CT surgery consult to primary on 6/18 for possible tissue biopsy vs need of VATS  Odette Fraction, MD Infectious Disease Physician [facility] [address] [place], [place] [address] Phone: [phone]  Fax: [phone]

## 2020-07-05 NOTE — Progress Notes (Signed)
[facility] Infectious Diseases Follow Up Note  Patient Identification: Patient Name: Jamie Carr MRN: [id number] Admit Date: 06/30/2020  6:14 AM Age: 65 y.o.Today's Date: 07/05/2020   Reason for Visit: Empyema  Principal Problem:   Pleural effusion Active Problems:   Malnutrition of moderate degree   Empyema (HCC)   Antibiotics: Unasyn 6/16-current  Lines/Tubes: Left-sided chest tube, external urethral catheter, PIV's  Interval Events: Continues to be afebrile, WBC went up to 13.1   Assessment Left lung empyema secondary to Streptococcus intermedius.  Exudative pleural fluid, cytology negative for malignant cells Blood cultures no growth to date.  TTE negative for vegetations CT surgery with no plans for surgical intervention/VATS  Left chest/abdominal wall loculated fluid collection Status post CT-guided aspiration by IR yielding 10 mm purulent material.  Gram stain with GPC, no growth in cultures in less than 24 hours  Left Pneumothorax  Volume overload/bilateral pedal edema Transaminitis  Recommendations Continue Unasyn as is.  Anticipate will need approximately 4 weeks of antibiotics Following cultures to finalize Monitor CBC and BMP on IV antibiotics Hepatic panel for completeness given transaminitis Chest tube management per CT surgery Defer need of bronchoscopy/biopsy to rule out malignancy to pulmonary Volume overload management per primary  Rest of the management as per the primary team. Thank you for the consult. Please page with pertinent questions or concerns.  ______________________________________________________________________ Subjective patient seen and examined at the bedside. She is complaining of some swelling in her bilateral foot  Vitals BP 114/71 (BP Location: Right Arm)   Pulse 94   Temp 98.4 F (36.9 C) (Oral)   Resp 20   Ht 5\' 2"  (1.575 m)   Wt 58 kg   SpO2 94%   BMI 23.39 kg/m      Physical Exam Constitutional: Not in acute distress, appears to be comfortable    Comments:   Cardiovascular:     Rate and Rhythm: Normal rate and regular rhythm.     Heart sounds:   Pulmonary:     Effort: Pulmonary effort is normal.     Comments: Left-sided chest tube  Abdominal:     Palpations: Abdomen is soft.     Tenderness: Nontender and nondistended  Musculoskeletal:        General: Bilateral pitting pedal edema  Skin:    Comments: No lesions or rashes  Neurological:     General: No focal deficit present.   Psychiatric:        Mood and Affect: Mood normal.   Pertinent Microbiology Results for orders placed or performed during the hospital encounter of 06/30/20  Resp Panel by RT-PCR (Flu A&B, Covid) Nasopharyngeal Swab     Status: None   Collection Time: 06/30/20  6:42 AM   Specimen: Nasopharyngeal Swab; Nasopharyngeal(NP) swabs in vial transport medium  Result Value Ref Range Status   SARS Coronavirus 2 by RT PCR NEGATIVE NEGATIVE Final    Comment: (NOTE) SARS-CoV-2 target nucleic acids are NOT DETECTED.  The SARS-CoV-2 RNA is generally detectable in upper respiratory specimens during the acute phase of infection. The lowest concentration of SARS-CoV-2 viral copies this assay can detect is 138 copies/mL. A negative result does not preclude SARS-Cov-2 infection and should not be used as the sole basis for treatment or other patient management decisions. A negative result may occur with  improper specimen collection/handling, submission of specimen other than nasopharyngeal swab, presence of viral mutation(s) within the areas targeted by this assay, and inadequate number of viral copies(<138 copies/mL). A negative result must  be combined with clinical observations, patient history, and epidemiological information. The expected result is Negative.  Fact Sheet for Patients:  BloggerCourse.com  Fact Sheet for Healthcare Providers:   SeriousBroker.it  This test is no t yet approved or cleared by the [place] FDA and  has been authorized for detection and/or diagnosis of SARS-CoV-2 by FDA under an Emergency Use Authorization (EUA). This EUA will remain  in effect (meaning this test can be used) for the duration of the COVID-19 declaration under Section 564(b)(1) of the Act, 21 U.S.C.section 360bbb-3(b)(1), unless the authorization is terminated  or revoked sooner.       Influenza A by PCR NEGATIVE NEGATIVE Final   Influenza B by PCR NEGATIVE NEGATIVE Final    Comment: (NOTE) The Xpert Xpress SARS-CoV-2/FLU/RSV plus assay is intended as an aid in the diagnosis of influenza from Nasopharyngeal swab specimens and should not be used as a sole basis for treatment. Nasal washings and aspirates are unacceptable for Xpert Xpress SARS-CoV-2/FLU/RSV testing.  Fact Sheet for Patients: BloggerCourse.com  Fact Sheet for Healthcare Providers: SeriousBroker.it  This test is not yet approved or cleared by the [place] FDA and has been authorized for detection and/or diagnosis of SARS-CoV-2 by FDA under an Emergency Use Authorization (EUA). This EUA will remain in effect (meaning this test can be used) for the duration of the COVID-19 declaration under Section 564(b)(1) of the Act, 21 U.S.C. section 360bbb-3(b)(1), unless the authorization is terminated or revoked.  Performed at [facility], [address] [place], [place] [address]   Blood culture (routine x 2)     Status: None   Collection Time: 06/30/20  7:37 AM   Specimen: Left Antecubital; Blood  Result Value Ref Range Status   Specimen Description   Final    LEFT ANTECUBITAL Performed at [facility], [address] [place], [place] [address]    Special Requests   Final    Blood Culture adequate volume Performed at [facility], [address], [place], [place] [address]    Culture   Final    NO GROWTH 5 DAYS Performed at [facility], [address] [place], [place] [address]    Report Status 07/05/2020 FINAL  Final  Blood culture (routine x 2)     Status: None   Collection Time: 06/30/20  7:42 AM   Specimen: Right Antecubital; Blood  Result Value Ref Range Status   Specimen Description   Final    RIGHT ANTECUBITAL Performed at [facility], [address] [place], [place] [address]    Special Requests   Final    Blood Culture adequate volume Performed at [facility], [address] [place], [place] [address]    Culture   Final    NO GROWTH 5 DAYS Performed at [facility], [address] [place], [place] [address]    Report Status 07/05/2020 FINAL  Final  Body fluid culture w Gram Stain     Status: None   Collection Time: 06/30/20 10:01 AM   Specimen: Pleural Fluid  Result Value Ref Range Status   Specimen Description   Final    PLEURAL Performed at [facility], [address] [place], [place] [address]    Special Requests   Final    NONE Performed at [facility], [address], [place], [place] [address]    Gram Stain   Final    ABUNDANT WBC PRESENT,BOTH PMN AND  MONONUCLEAR ABUNDANT GRAM POSITIVE COCCI Performed at [facility], [address], [place], [place] [address]    Culture ABUNDANT STREPTOCOCCUS INTERMEDIUS  Final   Report Status 07/02/2020 FINAL  Final   Organism ID, Bacteria STREPTOCOCCUS INTERMEDIUS  Final      Susceptibility   Streptococcus intermedius - MIC*    PENICILLIN <=0.06 SENSITIVE Sensitive     CEFTRIAXONE 0.25 SENSITIVE Sensitive     ERYTHROMYCIN <=0.12 SENSITIVE Sensitive     LEVOFLOXACIN 0.5 SENSITIVE Sensitive     VANCOMYCIN 0.5 SENSITIVE Sensitive     * ABUNDANT STREPTOCOCCUS INTERMEDIUS  MRSA PCR Screening     Status: None   Collection  Time: 06/30/20  4:17 PM   Specimen: Nasopharyngeal  Result Value Ref Range Status   MRSA by PCR NEGATIVE NEGATIVE Final    Comment:        The GeneXpert MRSA Assay (FDA approved for NASAL specimens only), is one component of a comprehensive MRSA colonization surveillance program. It is not intended to diagnose MRSA infection nor to guide or monitor treatment for MRSA infections. Performed at [facility], [address], [place], [place] [address]   Body fluid culture w Gram Stain     Status: None   Collection Time: 07/01/20 12:30 PM   Specimen: Pleura; Body Fluid  Result Value Ref Range Status   Specimen Description   Final    PLEURAL LT Performed at [facility], [address] [place], [place] [address]    Special Requests   Final    NONE Performed at [facility], [address] [place], [place] [address]    Gram Stain   Final    ABUNDANT WBC PRESENT,BOTH PMN AND MONONUCLEAR ABUNDANT GRAM POSITIVE COCCI    Culture   Final    ABUNDANT STREPTOCOCCUS INTERMEDIUS SUSCEPTIBILITIES PERFORMED ON PREVIOUS CULTURE WITHIN THE LAST 5 DAYS. Performed at [facility], [address] [place], [place] [address]    Report Status 07/03/2020 FINAL  Final  Aerobic/Anaerobic Culture w Gram Stain (surgical/deep wound)     Status: None (Preliminary result)   Collection Time: 07/03/20  4:14 PM   Specimen: Abscess  Result Value Ref Range Status   Specimen Description   Final    ABSCESS Performed at [facility], [address] [place], [place] [address]    Special Requests   Final    Normal Performed at [facility], [address] [place], [place] [address]    Gram Stain   Final    ABUNDANT WBC PRESENT,BOTH PMN AND MONONUCLEAR MODERATE GRAM POSITIVE COCCI    Culture   Final    NO GROWTH 3 DAYS NO ANAEROBES ISOLATED; CULTURE IN PROGRESS FOR 5 DAYS Performed at [facility], [address], [place], [place] [address]    Report Status PENDING  Incomplete    Pertinent Lab. CBC Latest Ref Rng & Units 07/06/2020 07/05/2020 07/04/2020  WBC 4.0 - 10.5 K/uL 13.1(H) 8.0 13.3(H)  Hemoglobin 12.0 - 15.0 g/dL 8.4(Z) 6.6(A) 6.3(K)  Hematocrit 36.0 - 46.0 % 29.6(L) 28.2(L) 30.8(L)  Platelets 150 - 400 K/uL 722(H) 540(H) 678(H)   CMP Latest Ref Rng & Units 07/06/2020 07/05/2020 07/04/2020  Glucose 70 - 99 mg/dL 98 99 92  BUN 8 - 23 mg/dL 8 11 10   Creatinine 0.44 - 1.00 mg/dL 1.60 1.09  Sodium 135 - 145 mmol/L 137 138 134(L)  Potassium 3.5 -  5.1 mmol/L 3.8 4.0 3.0(L)  Chloride 98 - 111 mmol/L 100 105 98  CO2 22 - 32 mmol/L 29 27 25   Calcium 8.9 - 10.3 mg/dL 9.0 ) 8.1(L)  Total Protein 6.5 - 8.1 g/dL 6.2(L) 5.2(L) 5.4(L)  Total Bilirubin 0.3 - 1.2 mg/dL 0.5 0.6 0.6  Alkaline Phos 38 - 126 U/L 114 92 111  AST 15 - 41 U/L 92(H) 53(H) 50(H)  ALT 0 - 44 U/L 79(H) 48(H) 44     Pertinent Imaging today Plain films and CT images have been personally visualized and interpreted; radiology reports have been reviewed. Decision making incorporated into the Impression / Recommendations.  I have spent more than 35 minutes for this patient encounter including review of prior medical records, coordination of care  with greater than 50% of time being face to face/counseling and discussing diagnostics/treatment plan with the patient/family.  Electronically signed by:   8.4(X, MD Infectious Disease Physician [facility] for Infectious Disease Pager: [phone]

## 2020-07-05 NOTE — Evaluation (Signed)
Physical Therapy Evaluation-1x Patient Details Name: Jamie Carr MRN: [id number] DOB: 1955-06-08 Today's Date: 07/05/2020   History of Present Illness  65 yo female admitted with acute respiratory failure, pleural effusion, empyema, pneumothorax. hx of tremors, epilepsy  Clinical Impression  On eval, pt was Ind with mobility. She walked ~250 feet around the unit. O2 100% on 2L. Dyspnea 2/4. Pt tolerated activity well. No acute PT needs. 1x eval. Recommend mobility with nursing supervision only due to lines (O2, chest tube). Will sign off.     Follow Up Recommendations No PT follow up    Equipment Recommendations  None recommended by PT    Recommendations for Other Services       Precautions / Restrictions Precautions Precautions: None Precaution Comments: chest tube Restrictions Weight Bearing Restrictions: No      Mobility  Bed Mobility               General bed mobility comments: oob in recliner    Transfers Overall transfer level: Independent                  Ambulation/Gait Ambulation/Gait assistance: Independent Gait Distance (Feet): 250 Feet Assistive device: None Gait Pattern/deviations: WFL(Within Functional Limits)     General Gait Details: good gait speed. dyspnea 2/4. O2 100% on 2L  Stairs            Wheelchair Mobility    Modified Rankin (Stroke Patients Only)       Balance Overall balance assessment: Independent                                           Pertinent Vitals/Pain Pain Assessment: 0-10 Pain Score: 1  Pain Location: chest tube site Pain Descriptors / Indicators: Discomfort Pain Intervention(s): Monitored during session    Home Living Family/patient expects to be discharged to:: Private residence Living Arrangements: Spouse/significant other Available Help at Discharge: Family Type of Home: House Home Access: Stairs to enter   Secretary/administrator of Steps: 2 Home Layout: Able to live  on main level with bedroom/bathroom;Two level Home Equipment: None      Prior Function Level of Independence: Independent         Comments: pt is very active. 2 barre classess a week + walks 3 miles/day     Hand Dominance        Extremity/Trunk Assessment   Upper Extremity Assessment Upper Extremity Assessment: Overall WFL for tasks assessed    Lower Extremity Assessment Lower Extremity Assessment: Overall WFL for tasks assessed    Cervical / Trunk Assessment Cervical / Trunk Assessment: Normal  Communication   Communication: No difficulties  Cognition Arousal/Alertness: Awake/alert Behavior During Therapy: WFL for tasks assessed/performed Overall Cognitive Status: Within Functional Limits for tasks assessed                                        General Comments      Exercises     Assessment/Plan    PT Assessment Patent does not need any further PT services  PT Problem List         PT Treatment Interventions      PT Goals (Current goals can be found in the Care Plan section)  Acute Rehab PT Goals Patient Stated Goal: return to PLOF/activity PT  Goal Formulation: All assessment and education complete, DC therapy    Frequency     Barriers to discharge        Co-evaluation               AM-PAC PT "6 Clicks" Mobility  Outcome Measure Help needed turning from your back to your side while in a flat bed without using bedrails?: None Help needed moving from lying on your back to sitting on the side of a flat bed without using bedrails?: None Help needed moving to and from a bed to a chair (including a wheelchair)?: None Help needed standing up from a chair using your arms (e.g., wheelchair or bedside chair)?: None Help needed to walk in hospital room?: None Help needed climbing 3-5 steps with a railing? : None 6 Click Score: 24    End of Session Equipment Utilized During Treatment: Oxygen Activity Tolerance: Patient tolerated  treatment well Patient left: in chair;with call bell/phone within reach        Time: 1110-1125 PT Time Calculation (min) (ACUTE ONLY): 15 min   Charges:   PT Evaluation $PT Eval Low Complexity: 1 Low             Faye Ramsay, PT Acute Rehabilitation  Office: [phone] Pager: [phone]

## 2020-07-05 NOTE — Progress Notes (Signed)
Pt resting RA pO2 sats are 98%.  Pt ambulating RA to end of hallway, pt dropped to 84% at lowest, quickly recovered to 91%.  Pt recovering immediately from ambulating RA PO2 was 98%.

## 2020-07-05 NOTE — Progress Notes (Signed)
NAME:  Tauheedah Bok MRN:  [id number] DOB:  04-Jul-1955 LOS: 5 ADMISSION DATE:  06/30/2020 CONSULTATION DATE:  06/30/2020 REFERRING MD: Dr. Dina Rich - EDP CHIEF COMPLAINT:  SOB, Pleural effusion   History of Present Illness:  65 year old female with PMHx significant for epilepsy, thyroid disease and tremors who presented to [place] ED with complaints of dyspnea and left-sided chest pain x 2-3 weeks (worse with deep breathing and coughing, radiating to L shoulder). Dyspnea was progressive prompting her to seek medical attention 6/14.   In the ED, patient was tachypneic but no hypoxia was observed. CXR demonstrated complete opacification of the left hemithorax suggestive of large pleural effusion. CT Chest obtained to further characterize effusion showed large left-sided pleural effusion as well as concern for tumor at the left hilum and a pleural based mass.   PCCM asked to consult for effusion management.   Pertinent Medical History:  Epilepsy, thyroid disease, tremor, myopia, presbyopia  Significant Hospital Events: Including procedures, antibiotic start and stop dates in addition to other pertinent events   6/14 - Admit to [facility], transfer to [facility], [facility] consult for management of effusion, CT placed by EDP 6/15 - Increased pain at CT insertion site, CXR demonstrating possible kink at insertion site/L chest wall. Large-bore CT placed at bedside (Dr. Tamala Julian). Episode of respiratory distress with hypotension post-CT placement, thought to be med effect versus reexpansion pulm edema. Improved with Fentanyl/Precedex, weaned off of Precedex. BPs improved with fluid resuscitation.  Culture ultimately grew Streptococcus intermedius [LDH greater than 10,000].  No malignant cells 6/16 - Improved CT drainage overnight, stable O2 needs. CXR with marginally increased PTX, decreased effusion, asymmetrical pulmonary edema on L. Transferred to floor.  Was able to come off oxygen 6/17 - Improved respiratory  status and O2 needs. CT Chest demonstrating 3.5 x 4cm loculated collection along with pneumothorax ex vacuo vs. Mass in the abdominal wall ->  IR to attempt aspiration today -> no growth as of 6/19.  Noticed to be on 2 L nasal cannula which she says was Comfort.  Infectious disease consultation recommending 4 weeks of antibiotics and continue Unasyn for now. 07/04/2020: [facility] hospitalist conversation with thoracic surgery: No role for VATS.  Recommend bronchoscopy consideration for mediastinal nodes.  Interim History / Subjective:    6/19 -now on the medical floor bed 1406.  RN monitoring patient off oxygen.  She feels better.  Feels appetite is improving.  She has some localized pain but she has declined any opioids.  She says she will manage with NSAIDs.  Left-sided chest tube output is 200 cc an 30 hours,     Objective:  Blood pressure 124/75, pulse 84, temperature 97.6 F (36.4 C), temperature source Oral, resp. rate 20, height 5' 2"  (1.575 m), weight 58 kg, SpO2 100 %.        Intake/Output Summary (Last 24 hours) at 07/05/2020 1314 Last data filed at 07/05/2020 1229 Gross per 24 hour  Intake 670 ml  Output 850 ml  Net -180 ml   Filed Weights   06/30/20 0625 07/02/20 1812  Weight: 55.3 kg 58 kg   General Appearance:  Looks deconditioned but much improved.  Thin lady.  Sitting in the chair listening to a book.  Off oxygen Head:  Normocephalic, without obvious abnormality, atraumatic Eyes:  PERRL - yes, conjunctiva/corneas - muddy     Ears:  Normal external ear canals, both ears Nose:  G tube - no Throat:  ETT TUBE - no , OG tube -  no Neck:  Supple,  No enlargement/tenderness/nodules Lungs: Clear to auscultation bilaterally, left-sided chest tube with purulent drainage 200 cc in the last 30 hours Heart:  S1 and S2 normal, no murmur, CVP - no.  Pressors - no Abdomen:  Soft, no masses, no organomegaly Genitalia / Rectal:  Not done Extremities:  Extremities-intact Skin:  ntact in  exposed areas . Sacral area -not examined Neurologic:  Sedation -none-> RASS -+1 equivalent. Moves all 4s -yes. CAM-ICU -negative for delirium. Orientation -x3     Labs/imaging that I have personally reviewed: (right click and "Reselect all SmartList Selections" daily)   CXR 6/16 Chest tube on left, unchanged, with left-sided pneumothorax, marginally larger. No tension component. Airspace opacity consistent with pneumonia left base. Asymmetric pulmonary edema on the left is stable. Right lung clear. Stable cardiac silhouette.  CXR 6/17 1. Empyema with stable left chest tube. Poor re-expansion of the left lung which might be related to pneumonia. Small volume left pneumothorax/pleural gas superimposed on diffuse pleural thickening. 2. Right lung remains clear.  CT Chest 6/17 IMPRESSION: 1. Left lateral approach thoracostomy tube with tip located in the major fissure, with a small moderate left pneumothorax. There is extensive nodular thickening of the parietal and visceral pleura throughout the left hemithorax with incomplete re-expansion of the left lung suggestive of the entrapment. 2. Similar appearance of the mass like area along the left anterior abdominal measuring 4 cm which is favored to represent loculated fluid along the costophrenic angle as opposed to a discrete chest wall mass. 3. Multifocal consolidations, nodularity and ground-glass opacities with septal thickening throughout the left lung. 4. Prominent to mildly enlarged lower cervical, mediastinal, and left axillary lymph nodes are again visualized and appears stable. 5. Trace upper abdominal, predominantly perihepatic, free fluid. 6. Diffuse subcutaneous edema with left chest wall subcutaneous emphysema. 7. Aortic atherosclerosis.  WBC 13.7 (21.5), H&H 9.8/30.3, Plt 627 (618)  Na 135, K 4.0, CO2 24, BUN 13, Cr 0.68 (0.73) Ca 9.0, Phos 3.6, Mg 1.9 AST 39, ALT 34, Alk Phos 119, Tbili 0.6  Pleural Fluid Cx  6/16 - abundant WBC, abundant gram positive cocci -> strep intermedius Pleural Fluid LDH 6/16 -  >10,000 Pleural Fluid Glucose 6/16 - 43 Pleural Fluid Cytology 6/15 - No malignant cells identified, acute inflammation  Resolved Hospital Problem List:     Assessment & Plan:  Large left sided pleural effusion, present on admission Streptococcus intermedius empyema -present on admission -status post large bore left-sided chest tube  07/05/20 -afebrile and you output of the chest tube is less  Plan  - Monitor output and if it continues to be less than 150-200 cc CCM to remove the tube in the next 1 or 2 days    Left hilar mass Left pleural mass Hilar mediastinal adenopathy and cervical lymphadenopathy  -07/05/2020: Agree with thoracic surgery no role for VATS.  Likely all these enlargements and masses are reactive to infection  Plan   -Dr. Baltazar Apo interventional bronchoscopy specialist will round on 07/06/20 -> likely will follow with repeat CT scan in a few months and decide about the role for bronchoscopy. VERSUS inpatient procedure   Acute hypoxemic respiratory failure secondary to all of the above present on admission  07/05/2020: Being monitored off oxygen.  Likely resolved  Plan - Check pulse ox of oxygen and also do ambulatory pulse ox and if greater than 88% DC oxygen   Best Practice: (right click and "Reselect all SmartList Selections" daily)  According to the  hospitalist y      SIGNATURE    Dr. Brand Males, M.D., F.C.C.P,  Pulmonary and Critical Care Medicine Staff Physician, [place] Director - Interstitial Lung Disease  Program  P[facility] [place] [facility] [place], [place], [address]  Pager: [phone], If no answer  OR between  19:00-7:00h: page [phone] Telephone (clinical office): [phone] Telephone (research): [phone]  1:23 PM 07/05/2020

## 2020-07-05 NOTE — Progress Notes (Signed)
PROGRESS NOTE    Jamie Carr  LOV:[id number] DOB: 08/28/1955 DOA: 06/30/2020 PCP: Henreitta Leber, PA-C   Brief Narrative:  The patient is a 65 year old thin Caucasian female with a past medical history significant for but not limited to prior thyroid disease and tremors as well as other comorbidities who presented to the ED with ongoing dyspnea for 2 to 3 weeks, cough and left-sided chest pain.  Her symptoms progressively started worsening and she was seen in the [facility] ER at [facility] and initial evaluation she is found to be tachypneic with a chest x-ray and chest CT noting complete opacification of the left hemipneumothorax.  CT noted a large left pleural effusion with concern for tumor at the hilum and pleural-based mass.  The case was discussed with pulmonary and the decision was made to transfer the patient to [facility] for further evaluation management.  In the ED she had a left-sided chest tube placed and 1 L was drained and this was and subsequently sent to the hospital.  Of note her Pleurx catheter stopped working and had to be exchanged out by pulmonary today and subsequently she was complaining of pain so she was placed on Precedex drip and became hypotensive.  Pulmonary is managing closely with a low threshold to start pressors subsequently improved and was weaned to room air.   Repeat CT Scan done and showed Abdominal Fluid Collection. IR consulted drained the fluid. ID consulted for further evaluation and recommendations and they are recommending at least 4 weeks of antibiotics and a prolonged treatment.  They recommended continuing Unasyn for the time being and finding an oral equivalent at discharge.  Given the pleural mass noted on the CT scan ID recommended obtaining a CT surgery evaluation for further management and asked them to weigh in if she needs a VATS for decortication of the thick rind from infection; I spoke with Dr. Cornelius Moras extensively today and he felt that there is no role for CT  surgery involvement in the patient's case and states that at some point she may need a biopsy but at this point she will not need anything as she is a pleural drain in place which is draining appropriately.  He recommended if she needed a biopsy doing a bronchoscopy biopsy but he felt that she was not improving on antibiotics and recommended medical treatment.  Patient is feeling better. Ambulatory Home O2 Screen done showed that she desaturated on Ambulation but was ok on Room Air. PT recommending no follow up. Still draining outptut from Chest Tube. Per PCCM if output is less thant 150-200 cc, CCM to remove Tube in the next few days. Dr. Delton Coombes to evaluate in the am and follow repeat scan and decide about outpatient vs. Inpatient bronchoscopy.   Assessment & Plan:   Principal Problem:   Pleural effusion Active Problems:   Malnutrition of moderate degree   Empyema (HCC)  Acute Respiratory Failure with hypoxia in the setting of Large Left Sided pleural effusion with concern for underlying malignancy but now the setting of strep intermedius empyema, present on admission -Admitted to the SDU  -Initially suspected malignant pleural effusion however the pleural fluid analysis showed no malignant cells and did show inflammation -Cultures are growing strep intermedius -Patient had a chest tube placement yesterday in the ED however it failed because it failed to drain due to likely blockage and kinking -Will follow up on her pleural fluid cell count, pH, protein, LDH and cytology cultures; pleural fluid color was obtained milky  with 16,000 total nucleated cell count and turbid appearance -Pulmonary was consulted for further assistance and evaluation -Because of fevers and leukocytosis of empirically added IV ceftriaxone and azithromycin -Pulmonary Dr. Katrinka Blazing reevaluated and pulled initial chest tube and replaced it and now it is draining appropriately -WBC went from 29.3 -> 23.0 -> 21.7 -> 13.7 and is  further improved to 13.7 -Cultures from it is growing Streptococcus intermedius which is pansensitive so she was de-escalated to IV Unasyn from IV ceftriaxone -SpO2: 96 % O2 Flow Rate (L/min): 2 L/min; now is off of supplemental oxygen via nasal cannula but desaturated on Home O2 Screen -Continuous pulse oximetry maintain O2 saturation greater 90% -Continue supplemental oxygen via nasal cannula and wean O2 as tolerated; pulmonary recommending continue chest tube and wean FiO2 for pulse ox greater than 88 --Repeat CT Scan showed "Left lateral approach thoracostomy tube with tip located in the major fissure, with a small moderate left pneumothorax. There is extensive nodular thickening of the parietal and visceral pleura throughout the left hemithorax with incomplete re-expansion of the left lung suggestive of the entrapment.  Similar appearance of the mass like area along the left anterior abdominal measuring 4 cm which is favored  to represent loculated fluid along the costophrenic angle as opposed to a discrete chest wall mass. Multifocal consolidations, nodularity and ground-glass opacities with septal thickening throughout the left lung. Prominent to mildly enlarged lower cervical, mediastinal, and left axillary lymph nodes are again visualized and appears stable. Trace upper abdominal, predominantly perihepatic, free fluid.  Diffuse subcutaneous edema with left chest wall subcutaneous emphysema. Aortic atherosclerosis." -Because of her empyema pulmonary recommending evaluating for head and neck origin especially all infections and they are getting an echocardiogram given the rare incidence of endocarditis but also the negative CT of the chest to assess if she needs intrapleural lytics but Pulmonary reviewed  Scan and will hold off intrapleural Lytics.  -ID consulted for further evaluation and Recc's; ID recommending a prolonged treatment with 4 weeks of antibiotics and continue on ampicillin sulbactam  for the time being and finding an oral equivalent the closest time of discharge -ID recommended also obtaining a CT surgery consult I spoke with Dr. Cornelius Moras.  I spoke with Dr. Cornelius Moras extensively today and he felt that there is no role for CT surgery involvement in the patient's case and states that at some point she may need a biopsy but at this point she will not need anything as she is a pleural drain in place which is draining appropriately.  He recommended if she needed a biopsy doing a bronchoscopy biopsy but he felt that she was not improving on antibiotics and recommended medical treatment. -Pulmonary Managing Chest tube and recommending removing in the next day or two if output is less than 150-200 cc -Dr. Marchelle Gearing is to sign out to Dr. Delton Coombes who is the interventional bronchoscopy specialist and he will determine whether to scan the patient's chest in a few months and decide about the role for bronchoscopy inpatient versus outpatient -Need to repeat ambulatory home O2 screen prior to discharge given that she did not desaturate today  Hypotension -In the setting of the Precedex drip, now improved -Pulmonary is providing boluses for the patient -Continue to monitor blood pressures per protocol -Blood pressures have now stabilized and last blood pressure was 120/72  Normocytic Anemia -Anemia panel was checked and showed an iron level of 20, U IBC 163, TIBC 183 saturation ratios of 11%, ferritin level 912, folate  level 12.0 and vitamin B12 of 926 -Patient's Hgb/Hct went from 10.5/30.6 -> 10.6/31.6 -> 9.4/28.4  -> 10.0/30.7 -> 9.8/30.3 -> 9.9/30.8 -> 9.1/28.2 -Continue to monitor for signs and symptoms of bleeding; currently no overt bleeding noted -Repeat CBC in a.m.  Abnormal LFTs -Likely reactive -AST went from 39 -> 50 -> 53 -ALT went from 34 -> 44 -> 48 -Continue to monitor and trend hepatic function panel and if continues to worsen or elevate will need a right upper quadrant ultrasound and  a acute hepatitis panel -Repeat CMP in the AM   Thrombocytosis -Likely Reactive from Above in setting of empyema -Patient's Platelet Count went from 635 -> 620 -> 571 -> 618 -> 627 -> 678 -> 540 -Continue to Monitor and Trend -Repeat CBC in the AM  Hyponatremia -Patient's Na+ has improved to 138 -Initially there was a concern for malignancy however now is in the setting of empyema -Continue to Monitor and Trend -Repeat CMP in the AM  Hypomagnesemia -Replete. Mag Level is now 1.8 -Continue to Monitor and Replete as Necessary -Repeat CMP in the AM   Hypokalemia -Mild. Patient's K+ was 3.0 and improved to 4.0 -Continue to Monitor and Replete as Necessary  -Repeat CMP in the AM   Metabolic Acidosis -Mild and improved. Patient's CO2 is now 27, AG is 6, and Chloride Level is 105 -Continue to Monitor and Trend -Repeat CMP in the AM   Abnormal EKG -Was concerning for pericardial effusion -Echocardiogram and showed Normal EF of 60-65%  Left hilar mass and left pleural soft tissue mass with concern for malignancy however this is now a Streptococcus intermedius empyema -She had a pending pleural fluid studies which we will need to follow cytology for -Pulmonary recommending repeating chest CT once effusion is resolved for better characterization of chest possible masses -Per pulmonary if pleural fluid remains unremarkable/indeterminate may need a bronchoscopy for biopsy -Repeat CT Scan showed "Left lateral approach thoracostomy tube with tip located in the major fissure, with a small moderate left pneumothorax. There is extensive nodular thickening of the parietal and visceral pleura throughout the left hemithorax with incomplete re-expansion of the left lung suggestive of the entrapment.  Similar appearance of the mass like area along the left anterior abdominal measuring 4 cm which is favored  to represent loculated fluid along the costophrenic angle as opposed to a discrete chest wall  mass. Multifocal consolidations, nodularity and ground-glass opacities with septal thickening throughout the left lung. Prominent to mildly enlarged lower cervical, mediastinal, and left axillary lymph nodes are again visualized and appears stable. Trace upper abdominal, predominantly perihepatic, free fluid.  Diffuse subcutaneous edema with left chest wall subcutaneous emphysema. Aortic atherosclerosis." -ID consulted for further evaluation; they are recommending CT surgery to evaluate the patient to get up tissue biopsy to determine the cause of mass and further management and have them if she needs a VATS for decortication of thick rind from infection -CT Surgery felt no role for VATS and felt they did not need to consult as her Chest Tube is draining well  -See above. Dr. Delton Coombes to evaluate for Bronchosocpy   Abdominal Loculated Fluid Collection -As above -Obtained IR eval and CT guided Aspiration which sent 10 mL   Non-Severe (Moderate) Malnutrition in the context of Acute Illness/Injury  -Nutritionist consulted for further evaluation and recommendations -The nutritionist team is recommending boost breeze once a day as well as Molli Posey 1.4 p.o. once a day and a multivitamin with minerals daily  DVT prophylaxis: Enoxaparin 40 mg sq q24h Code Status: FULL CODE  Family Communication: No family present at bedside  Disposition Plan: Pending further clearance and evaluation by pulmonary; patient also need PT OT to further evaluate and treat and recommending no Follow up  Status is: Inpatient  Remains inpatient appropriate because:Ongoing diagnostic testing needed not appropriate for outpatient work up, Unsafe d/c plan, IV treatments appropriate due to intensity of illness or inability to take PO, and Inpatient level of care appropriate due to severity of illness  Dispo: The patient is from: Home              Anticipated d/c is to: Home              Patient currently is not medically stable to  d/c.   Difficult to place patient No  Consultants:  Pulmonary IR ID Discussed with Dr. Cornelius Moras  Procedures: Chest Tube Insertion  ECHOCARDIOGRAM IMPRESSIONS     1. Left ventricular ejection fraction, by estimation, is 60 to 65%. The  left ventricle has normal function. The left ventricle has no regional  wall motion abnormalities. Left ventricular diastolic function could not  be evaluated.   2. Right ventricular systolic function is normal. The right ventricular  size is normal.   3. The mitral valve is normal in structure. No evidence of mitral valve  regurgitation. No evidence of mitral stenosis.   4. The aortic valve is grossly normal. Aortic valve regurgitation is not  visualized. No aortic stenosis is present.   FINDINGS   Left Ventricle: Left ventricular ejection fraction, by estimation, is 60  to 65%. The left ventricle has normal function. The left ventricle has no  regional wall motion abnormalities. The left ventricular internal cavity  size was normal in size. There is   no left ventricular hypertrophy. Left ventricular diastolic function  could not be evaluated.   Right Ventricle: The right ventricular size is normal. No increase in  right ventricular wall thickness. Right ventricular systolic function is  normal.   Left Atrium: Left atrial size was normal in size.   Right Atrium: Right atrial size was normal in size.   Pericardium: There is no evidence of pericardial effusion.   Mitral Valve: The mitral valve is normal in structure. No evidence of  mitral valve regurgitation. No evidence of mitral valve stenosis.   Tricuspid Valve: The tricuspid valve is normal in structure. Tricuspid  valve regurgitation is trivial.   Aortic Valve: The aortic valve is grossly normal. Aortic valve  regurgitation is not visualized. No aortic stenosis is present.   Pulmonic Valve: The pulmonic valve was not well visualized. Pulmonic valve  regurgitation is not  visualized.   Aorta: The aortic root and ascending aorta are structurally normal, with  no evidence of dilitation.   IAS/Shunts: The atrial septum is grossly normal.      LEFT VENTRICLE  PLAX 2D  LVIDd:         3.70 cm  LVIDs:         2.60 cm  LV PW:         1.00 cm  LV IVS:        0.90 cm  LVOT diam:     2.20 cm  LVOT Area:     3.80 cm      IVC  IVC diam: 1.70 cm   LEFT ATRIUM         Index  LA diam:    2.80 cm 1.77 cm/m  AORTA  Ao Root diam: 3.10 cm  Ao Asc diam:  3.30 cm      SHUNTS  Systemic Diam: 2.20 cm   Antimicrobials:  Anti-infectives (From admission, onward)    Start     Dose/Rate Route Frequency Ordered Stop   07/03/20 0600  Ampicillin-Sulbactam (UNASYN) 3 g in sodium chloride 0.9 % 100 mL IVPB        3 g 200 mL/hr over 30 Minutes Intravenous Every 6 hours 07/02/20 1409     07/01/20 1045  cefTRIAXone (ROCEPHIN) 2 g in sodium chloride 0.9 % 100 mL IVPB  Status:  Discontinued        2 g 200 mL/hr over 30 Minutes Intravenous Every 24 hours 07/01/20 0947 07/02/20 1409   07/01/20 1000  cefTRIAXone (ROCEPHIN) 1 g in sodium chloride 0.9 % 100 mL IVPB  Status:  Discontinued        1 g 200 mL/hr over 30 Minutes Intravenous Every 24 hours 06/30/20 1723 07/01/20 0947   07/01/20 1000  azithromycin (ZITHROMAX) 500 mg in sodium chloride 0.9 % 250 mL IVPB  Status:  Discontinued        500 mg 250 mL/hr over 60 Minutes Intravenous Every 24 hours 06/30/20 1723 07/02/20 1409   06/30/20 0700  vancomycin (VANCOCIN) IVPB 1000 mg/200 mL premix        1,000 mg 200 mL/hr over 60 Minutes Intravenous  Once 06/30/20 0655 06/30/20 1134   06/30/20 0700  cefTRIAXone (ROCEPHIN) 2 g in sodium chloride 0.9 % 100 mL IVPB        2 g 200 mL/hr over 30 Minutes Intravenous  Once 06/30/20 0655 06/30/20 1017   06/30/20 0700  azithromycin (ZITHROMAX) 500 mg in sodium chloride 0.9 % 250 mL IVPB        500 mg 250 mL/hr over 60 Minutes Intravenous  Once 06/30/20 0655 06/30/20 1030         Subjective: Seen and examined at bedside and she had some discomfort at her chest tube site and pain.  Actually doing a bit better.  Does not feel short of breath.  No lightheadedness or dizziness.  No other concerns or complaints this time.  Objective: Vitals:   07/04/20 1518 07/04/20 2145 07/05/20 0515 07/05/20 1500  BP: 122/69 108/65 124/75 120/72  Pulse: (!) 102 94 84 88  Resp: Temp: 98.4 F (36.9 C) 97.9 F (36.6 C) 97.6 F (36.4 C) 97.7 F (36.5 C)  TempSrc: Oral Oral Oral Oral  SpO2: 98%  100% 96%  Weight:      Height:        Intake/Output Summary (Last 24 hours) at 07/05/2020 1647 Last data filed at 07/05/2020 1300 Gross per 24 hour  Intake 1030 ml  Output 850 ml  Net 180 ml    Filed Weights   06/30/20 0625 07/02/20 1812  Weight: 55.3 kg 58 kg   Examination: Physical Exam:  Constitutional: Thin cachectic Caucasian female currently in no acute distress sitting in the chair bedside appears calm and comfortable Eyes: Lids and conjunctivae normal, sclerae anicteric  ENMT: External Ears, Nose appear normal. Grossly normal hearing. Neck: Appears normal, supple, no cervical masses, normal ROM, no appreciable thyromegaly; no JVD Respiratory: Diminished to auscultation bilaterally, no wheezing, rales, rhonchi or crackles. Normal respiratory effort and patient is not tachypenic. No accessory muscle use.  Unlabored breathing and not wearing supplemental oxygen via nasal cannula now; Chest Tube in place on the Left  Cardiovascular: RRR,  no murmurs / rubs / gallops. S1 and S2 auscultated. No extremity edema.  Abdomen: Soft, non-tender, non-distended. Bowel sounds positive.  GU: Deferred. Musculoskeletal: No clubbing / cyanosis of digits/nails. No joint deformity upper and lower extremities.  Skin: No rashes, lesions, ulcers on a limited skin evaluation. No induration; Warm and dry.  Neurologic: CN 2-12 grossly intact with no focal deficits. Romberg sign and  cerebellar reflexes not assessed.  Psychiatric: Normal judgment and insight. Alert and oriented x 3. Normal mood and appropriate affect.   Data Reviewed: I have personally reviewed following labs and imaging studies  CBC: Recent Labs  Lab 06/30/20 0625 06/30/20 1922 07/01/20 0246 07/02/20 0257 07/03/20 0457 07/04/20 0550 07/05/20 0554  WBC 29.3*   < > 21.7* 21.5* 13.7* 13.3* 8.0  NEUTROABS 24.7*  --   --  16.6* 10.2* 10.1* 4.9  HGB 10.5*   < > 9.4* 10.0* 9.8* 9.9* 9.1*  HCT 30.6*   < > 28.4* 30.7* 30.3* 30.8* 28.2*  MCV 91.1   < > 93.7 95.3 95.3 96.6 95.6  PLT 635*   < > 571* 618* 627* 678* 540*   < > = values in this interval not displayed.    Basic Metabolic Panel: Recent Labs  Lab 07/01/20 0246 07/02/20 0257 07/03/20 0457 07/04/20 0550 07/05/20 0554  NA 132* 132* 135 134* 138  K 3.5 4.4 4.0 3.0* 4.0  CL 103 103 103 98 105  CO2 21* 21* GLUCOSE 106* 79 96 92 99  BUN 33* CREATININE 0.78 0.73 0.68 0.66 0.50  CALCIUM 8.9 9.3 9.0 8.1* 8.4*  MG  --  1.8 1.9 1.8 1.8  PHOS  --  3.7 3.6 2.9 3.7    GFR: Estimated Creatinine Clearance: 56.9 mL/min (by C-G formula based on SCr of 0.5 mg/dL). Liver Function Tests: Recent Labs  Lab 07/01/20 0246 07/02/20 0257 07/03/20 0457 07/04/20 0550 07/05/20 0554  AST 32 27 39 50* 53*  ALT 35 33 34 44 48*  ALKPHOS 141* 142* 119 111 92  BILITOT 0.7 0.9 0.6 0.6 0.6  PROT 5.4* 5.8* 5.4* 5.4* 5.2*  ALBUMIN 1.8* 1.8* 1.7* 1.9* 1.9*    No results for input(s): LIPASE, AMYLASE in the last 168 hours. No results for input(s): AMMONIA in the last 168 hours. Coagulation Profile: No results for input(s): INR, PROTIME in the last 168 hours. Cardiac Enzymes: No results for input(s): CKTOTAL, CKMB, CKMBINDEX, TROPONINI in the last 168 hours. BNP (last 3 results) No results for input(s): PROBNP in the last 8760 hours. HbA1C: No results for input(s): HGBA1C in the last 72 hours. CBG: Recent Labs  Lab  07/04/20 1157 07/04/20 1650 07/04/20 2147 07/05/20 0755 07/05/20 1213  GLUCAP 75 100* 143* 87 84    Lipid Profile: No results for input(s): CHOL, HDL, LDLCALC, TRIG, CHOLHDL, LDLDIRECT in the last 72 hours. Thyroid Function Tests: No results for input(s): TSH, T4TOTAL, FREET4, T3FREE, THYROIDAB in the last 72 hours. Anemia Panel: No results for input(s): VITAMINB12, FOLATE, FERRITIN, TIBC, IRON, RETICCTPCT in the last 72 hours.  Sepsis Labs: Recent Labs  Lab 06/30/20 0737 06/30/20 1114  LATICACIDVEN 1.2 1.3     Recent Results (from the past 240 hour(s))  Resp Panel by RT-PCR (Flu A&B, Covid) Nasopharyngeal Swab     Status: None   Collection Time: 06/30/20  6:42 AM   Specimen: Nasopharyngeal Swab; Nasopharyngeal(NP) swabs in vial transport medium  Result Value Ref Range Status   SARS  Coronavirus 2 by RT PCR NEGATIVE NEGATIVE Final    Comment: (NOTE) SARS-CoV-2 target nucleic acids are NOT DETECTED.  The SARS-CoV-2 RNA is generally detectable in upper respiratory specimens during the acute phase of infection. The lowest concentration of SARS-CoV-2 viral copies this assay can detect is 138 copies/mL. A negative result does not preclude SARS-Cov-2 infection and should not be used as the sole basis for treatment or other patient management decisions. A negative result may occur with  improper specimen collection/handling, submission of specimen other than nasopharyngeal swab, presence of viral mutation(s) within the areas targeted by this assay, and inadequate number of viral copies(<138 copies/mL). A negative result must be combined with clinical observations, patient history, and epidemiological information. The expected result is Negative.  Fact Sheet for Patients:  BloggerCourse.com  Fact Sheet for Healthcare Providers:  SeriousBroker.it  This test is no t yet approved or cleared by the [place] FDA and  has  been authorized for detection and/or diagnosis of SARS-CoV-2 by FDA under an Emergency Use Authorization (EUA). This EUA will remain  in effect (meaning this test can be used) for the duration of the COVID-19 declaration under Section 564(b)(1) of the Act, 21 U.S.C.section 360bbb-3(b)(1), unless the authorization is terminated  or revoked sooner.       Influenza A by PCR NEGATIVE NEGATIVE Final   Influenza B by PCR NEGATIVE NEGATIVE Final    Comment: (NOTE) The Xpert Xpress SARS-CoV-2/FLU/RSV plus assay is intended as an aid in the diagnosis of influenza from Nasopharyngeal swab specimens and should not be used as a sole basis for treatment. Nasal washings and aspirates are unacceptable for Xpert Xpress SARS-CoV-2/FLU/RSV testing.  Fact Sheet for Patients: BloggerCourse.com  Fact Sheet for Healthcare Providers: SeriousBroker.it  This test is not yet approved or cleared by the [place] FDA and has been authorized for detection and/or diagnosis of SARS-CoV-2 by FDA under an Emergency Use Authorization (EUA). This EUA will remain in effect (meaning this test can be used) for the duration of the COVID-19 declaration under Section 564(b)(1) of the Act, 21 U.S.C. section 360bbb-3(b)(1), unless the authorization is terminated or revoked.  Performed at [facility], [address], [place], [place] [address]   Blood culture (routine x 2)     Status: None   Collection Time: 06/30/20  7:37 AM   Specimen: Left Antecubital; Blood  Result Value Ref Range Status   Specimen Description   Final    LEFT ANTECUBITAL Performed at [facility], [address] [place], [place] [address]    Special Requests   Final    Blood Culture adequate volume Performed at [facility], [address] [place], [place] [address]    Culture   Final    NO GROWTH 5 DAYS Performed at [facility], [address] [place], [place] [address]    Report Status 07/05/2020 FINAL  Final  Blood culture (routine x 2)     Status: None   Collection Time: 06/30/20  7:42 AM   Specimen: Right Antecubital; Blood  Result Value Ref Range Status   Specimen Description   Final    RIGHT ANTECUBITAL Performed at [facility], [address], [place], [place] [address]    Special Requests   Final    Blood Culture adequate volume Performed at [facility], [address] [place], [place] [address]    Culture   Final    NO GROWTH 5 DAYS Performed at  [facility], [address] [place], [place] [id number]    Report Status 07/05/2020 FINAL  Final  Body fluid culture w Gram Stain     Status: None   Collection Time: 06/30/20 10:01 AM   Specimen: Pleural Fluid  Result Value Ref Range Status   Specimen Description   Final    PLEURAL Performed at [facility] [facility], [address], [place], [place] [address]    Special Requests   Final    NONE Performed at [facility], [address], [place], [place] [address]    Gram Stain   Final    ABUNDANT WBC PRESENT,BOTH PMN AND MONONUCLEAR ABUNDANT GRAM POSITIVE COCCI Performed at [facility], [address] [place], [place] [address]    Culture ABUNDANT STREPTOCOCCUS INTERMEDIUS  Final   Report Status 07/02/2020 FINAL  Final   Organism ID, Bacteria STREPTOCOCCUS INTERMEDIUS  Final      Susceptibility   Streptococcus intermedius - MIC*    PENICILLIN <=0.06 SENSITIVE Sensitive     CEFTRIAXONE 0.25 SENSITIVE Sensitive     ERYTHROMYCIN <=0.12 SENSITIVE Sensitive     LEVOFLOXACIN 0.5 SENSITIVE Sensitive     VANCOMYCIN 0.5 SENSITIVE Sensitive     * ABUNDANT STREPTOCOCCUS INTERMEDIUS  MRSA PCR Screening     Status: None   Collection Time: 06/30/20  4:17 PM   Specimen: Nasopharyngeal  Result Value Ref Range Status   MRSA by PCR NEGATIVE NEGATIVE Final     Comment:        The GeneXpert MRSA Assay (FDA approved for NASAL specimens only), is one component of a comprehensive MRSA colonization surveillance program. It is not intended to diagnose MRSA infection nor to guide or monitor treatment for MRSA infections. Performed at [facility], [address] [place], [place] [address]   Body fluid culture w Gram Stain     Status: None   Collection Time: 07/01/20 12:30 PM   Specimen: Pleura; Body Fluid  Result Value Ref Range Status   Specimen Description   Final    PLEURAL LT Performed at [facility], [address] [place], [place] [address]    Special Requests   Final    NONE Performed at [facility], [address] [place], [place] [address]    Gram Stain   Final    ABUNDANT WBC PRESENT,BOTH PMN AND MONONUCLEAR ABUNDANT GRAM POSITIVE COCCI    Culture   Final    ABUNDANT STREPTOCOCCUS INTERMEDIUS SUSCEPTIBILITIES PERFORMED ON PREVIOUS CULTURE WITHIN THE LAST 5 DAYS. Performed at [facility], [address] [place], [place] [address]    Report Status 07/03/2020 FINAL  Final  Aerobic/Anaerobic Culture w Gram Stain (surgical/deep wound)     Status: None (Preliminary result)   Collection Time: 07/03/20  4:14 PM   Specimen: Abscess  Result Value Ref Range Status   Specimen Description   Final    ABSCESS Performed at [facility], [address] [place], [place] [address]    Special Requests   Final    Normal Performed at [facility], [address] [place], [place] [address]    Gram Stain   Final    ABUNDANT WBC PRESENT,BOTH PMN AND MONONUCLEAR MODERATE GRAM POSITIVE COCCI    Culture   Final    NO GROWTH 2 DAYS Performed at [facility], [address] [place], [place] [address]    Report Status  PENDING  Incomplete     RN Pressure Injury Documentation:     Estimated body mass index is 23.39 kg/m as  calculated from the following:   Height as of this encounter: 5\' 2"  (1.575 m).   Weight as of this encounter: 58 kg.  Malnutrition Type:  Nutrition Problem: Moderate Malnutrition Etiology: acute illness   Malnutrition Characteristics:  Signs/Symptoms: mild fat depletion, mild muscle depletion   Nutrition Interventions:  Interventions: Boost Breeze, MVI, Other (Comment) Farms 1.4)   Radiology Studies: DG CHEST PORT 1 VIEW  Result Date: 07/05/2020 CLINICAL DATA:  Shortness of breath. EXAM: PORTABLE CHEST 1 VIEW COMPARISON:  July 04, 2020 FINDINGS: Cardiomediastinal silhouette is normal. Mediastinal contours appear intact. Unchanged position of the left chest tube. Slight enlargement of the left pneumothorax with marked thickening of the pleura, linear and nodular interstitial thickening throughout the left lung. Small left pleural effusion. Osseous structures are without acute abnormality. Soft tissues are grossly normal. IMPRESSION: 1. Slight enlargement of the left pneumothorax with marked thickening of the pleura, and associated interstitial thickening. 2. Small left pleural effusion. Electronically Signed   By: July 06, 2020 M.D.   On: 07/05/2020 11:44   DG CHEST PORT 1 VIEW  Result Date: 07/04/2020 CLINICAL DATA:  Chest tube.  Follow-up pleural effusion EXAM: PORTABLE CHEST 1 VIEW COMPARISON:  07/03/2020 FINDINGS: Left chest tube remains in place. Left pneumothorax unchanged from the prior study. Diffuse pleural thickening on the left unchanged. Diffuse airspace disease in the left lung is unchanged. Left lower lobe volume loss and consolidation unchanged. Right lung remains clear. IMPRESSION: Mild to moderate left pneumothorax unchanged with diffuse airspace disease in the left lung. Left chest tube remains in place. Electronically Signed   By: 07/05/2020 M.D.   On: 07/04/2020 09:43    Scheduled Meds:  chlorhexidine  15 mL Mouth Rinse BID   Chlorhexidine Gluconate  Cloth  6 each Topical Daily   docusate  100 mg Per Tube BID   enoxaparin (LOVENOX) injection  40 mg Subcutaneous Q24H   feeding supplement  1 Container Oral Q24H   feeding supplement (KATE FARMS STANDARD 1.4)  325 mL Oral Daily   loratadine  10 mg Oral QHS   mouth rinse  15 mL Mouth Rinse q12n4p   multivitamin with minerals  1 tablet Oral Daily   polyethylene glycol  17 g Per Tube Daily   vitamin C  250 mg Oral Daily   Continuous Infusions:  sodium chloride     ampicillin-sulbactam (UNASYN) IV 3 g (07/05/20 1229)    LOS: 5 days   07/07/20, DO [facility] P[facility]ER is on AMION  If 7PM-7AM, please contact night-coverage [email or web address]

## 2020-07-06 ENCOUNTER — Inpatient Hospital Stay (HOSPITAL_COMMUNITY): Payer: BC Managed Care – PPO

## 2020-07-06 LAB — HEPATITIS PANEL, ACUTE
HCV Ab: NONREACTIVE
Hep A IgM: NONREACTIVE
Hep B C IgM: NONREACTIVE
Hepatitis B Surface Ag: NONREACTIVE

## 2020-07-06 LAB — COMPREHENSIVE METABOLIC PANEL
ALT: 79 U/L — ABNORMAL HIGH (ref 0–44)
AST: 92 U/L — ABNORMAL HIGH (ref 15–41)
Albumin: 2.3 g/dL — ABNORMAL LOW (ref 3.5–5.0)
Alkaline Phosphatase: 114 U/L (ref 38–126)
Anion gap: 8 (ref 5–15)
BUN: 8 mg/dL (ref 8–23)
CO2: 29 mmol/L (ref 22–32)
Calcium: 9 mg/dL (ref 8.9–10.3)
Chloride: 100 mmol/L (ref 98–111)
Creatinine, Ser: 0.62 mg/dL (ref 0.44–1.00)
GFR, Estimated: >60 mL/min (ref >60)
Glucose, Bld: 98 mg/dL (ref 70–99)
Potassium: 3.8 mmol/L (ref 3.5–5.1)
Sodium: 137 mmol/L (ref 135–145)
Total Bilirubin: 0.5 mg/dL (ref 0.3–1.2)
Total Protein: 6.2 g/dL — ABNORMAL LOW (ref 6.5–8.1)

## 2020-07-06 LAB — CBC WITH DIFFERENTIAL/PLATELET
Abs Immature Granulocytes: 0.27 10*3/uL — ABNORMAL HIGH (ref 0.00–0.07)
Basophils Absolute: 0.1 10*3/uL (ref 0.0–0.1)
Basophils Relative: 1 %
Eosinophils Absolute: 0 10*3/uL (ref 0.0–0.5)
Eosinophils Relative: 0 %
HCT: 29.6 % — ABNORMAL LOW (ref 36.0–46.0)
Hemoglobin: 9.7 g/dL — ABNORMAL LOW (ref 12.0–15.0)
Immature Granulocytes: 2 %
Lymphocytes Relative: 11 %
Lymphs Abs: 1.5 10*3/uL (ref 0.7–4.0)
MCH: 31.1 pg (ref 26.0–34.0)
MCHC: 32.8 g/dL (ref 30.0–36.0)
MCV: 94.9 fL (ref 80.0–100.0)
Monocytes Absolute: 1.1 10*3/uL — ABNORMAL HIGH (ref 0.1–1.0)
Monocytes Relative: 8 %
Neutro Abs: 10.2 10*3/uL — ABNORMAL HIGH (ref 1.7–7.7)
Neutrophils Relative %: 78 %
Platelets: 722 10*3/uL — ABNORMAL HIGH (ref 150–400)
RBC: 3.12 MIL/uL — ABNORMAL LOW (ref 3.87–5.11)
RDW: 13.7 % (ref 11.5–15.5)
WBC: 13.1 10*3/uL — ABNORMAL HIGH (ref 4.0–10.5)
nRBC: 0 % (ref 0.0–0.2)

## 2020-07-06 LAB — MAGNESIUM: Magnesium: 2 mg/dL (ref 1.7–2.4)

## 2020-07-06 LAB — GLUCOSE, CAPILLARY
Glucose-Capillary: 107 mg/dL — ABNORMAL HIGH (ref 70–99)
Glucose-Capillary: 84 mg/dL (ref 70–99)
Glucose-Capillary: 96 mg/dL (ref 70–99)
Glucose-Capillary: 167 mg/dL — ABNORMAL HIGH (ref 70–99)

## 2020-07-06 LAB — PHOSPHORUS: Phosphorus: 4.1 mg/dL (ref 2.5–4.6)

## 2020-07-06 MED ORDER — METHOCARBAMOL 500 MG PO TABS
500.0000 mg | ORAL_TABLET | Freq: Four times a day (QID) | ORAL | Status: DC | PRN
  Administered 2020-07-06 – 2020-07-11 (×3): 500 mg via ORAL
  Filled 2020-07-06 (×3): qty 1

## 2020-07-06 MED ORDER — LIDOCAINE 5 % EX PTCH: 1.0000 | MEDICATED_PATCH | Freq: Every day | CUTANEOUS | Status: DC | PRN

## 2020-07-06 MED ORDER — LIDOCAINE 5 % EX PTCH: 1.0000 | MEDICATED_PATCH | CUTANEOUS | Status: DC

## 2020-07-06 NOTE — Progress Notes (Signed)
NAME:  Jamie Carr MRN:  [id number] DOB:  [birth year] LOS: 6 ADMISSION DATE:  06/30/2020 CONSULTATION DATE:  06/30/2020 REFERRING MD: Dr. Wilkie Aye - EDP CHIEF COMPLAINT:  SOB, Pleural effusion   History of Present Illness:  65 year old female with PMHx significant for epilepsy, thyroid disease and tremors who presented to [facility] with complaints of dyspnea and left-sided chest pain x 2-3 weeks (worse with deep breathing and coughing, radiating to L shoulder). Dyspnea was progressive prompting her to seek medical attention 6/14.   In the ED, patient was tachypneic but no hypoxia was observed. CXR demonstrated complete opacification of the left hemithorax suggestive of large pleural effusion. CT Chest obtained to further characterize effusion showed large left-sided pleural effusion as well as concern for tumor at the left hilum and a pleural based mass.   PCCM asked to consult for effusion management.   Pertinent Medical History:  Epilepsy, thyroid disease, tremor, myopia, presbyopia  Significant Hospital Events:  6/14 - Admit to [facility], transfer to [facility] consult for management of effusion, CT placed by EDP 6/15 - Increased pain at CT insertion site, CXR demonstrating possible kink at insertion site/L chest wall. Large-bore CT placed at bedside (Dr. Katrinka Blazing). Episode of respiratory distress with hypotension post-CT placement, thought to be med effect versus reexpansion pulm edema. Improved with Fentanyl/Precedex, weaned off of Precedex. BPs improved with fluid resuscitation.  Culture ultimately grew Streptococcus intermedius [LDH greater than 10,000].  No malignant cells 6/16 - Improved CT drainage overnight, stable O2 needs. CXR with marginally increased PTX, decreased effusion, asymmetrical pulmonary edema on L. Transferred to floor.  Was able to come off oxygen 6/17 - Improved respiratory status and O2 needs. CT Chest demonstrating 3.5 x 4cm loculated collection along with pneumothorax  ex vacuo vs. Mass in the abdominal wall ->  IR to attempt aspiration today -> no growth as of 6/19.  Noticed to be on 2 L nasal cannula which she says was Comfort.  Infectious disease consultation recommending 4 weeks of antibiotics and continue Unasyn for now. 07/04/2020: [facility] conversation with thoracic surgery: No role for VATS.  Recommend bronchoscopy consideration for mediastinal nodes. 6/20 No acute events overnight and patient is now on RA and feels well. Large bore CT remains in place and drained in the last 24hrs. PTX slight worse on CXR  Interim History / Subjective:  No acute events overnight  State she feels well with no acute complains other than chest wall discomfort from CT  Objective:  Blood pressure 114/71, pulse 94, temperature 98.4 F (36.9 C), temperature source Oral, resp. rate 20, height 5\' 2"  (1.575 m), weight 58 kg, SpO2 94 %.        Intake/Output Summary (Last 24 hours) at 07/06/2020 1403 Last data filed at 07/06/2020 0900 Gross per 24 hour  Intake 480 ml  Output 550 ml  Net -70 ml    Filed Weights   06/30/20 0625 07/02/20 1812  Weight: 55.3 kg 58 kg    Physical Exam General: Pleasant thin middle aged female sitting up in bedside recline in NAD HEENT: [place]/AT, MM pink/moist, PERRL,  Neuro: Alert and oriented x3, non-focal  CV: s1s2 regular rate and rhythm, no murmur, rubs, or gallops,  PULM:  Clear to ascultation, slightly diminished to left side, no increased work of breathing, on RA GI: soft, bowel sounds active in all 4 quadrants, non-tender, non-distended Extremities: warm/dry, no edema  Skin: no rashes or lesions  Labs/imaging that I have personally  reviewed:  CXR 6/16 Chest tube on left, unchanged, with left-sided pneumothorax, marginally larger. Airspace opacity consistent with pneumonia left base. Asymmetric pulmonary edema on the left is stable. CXR 6/17 Empyema with stable left chest tube. Poor re-expansion of the left lung. Small  volume left pneumothorax/pleural gas superimposed on diffuse pleural thickening. CT Chest 6/17 Left lateral approach thoracostomy tube with tip located in the major fissure, with a small moderate left pneumothorax. There is extensive nodular thickening of the parietal and visceral pleura throughout the left hemithorax with incomplete re-expansion of the left lung suggestive of the entrapment. Mass like area along the left anterior abdominal measuring 4 cm which is favored to represent loculated fluid along the costophrenic angle as opposed to a discrete chest wall mass. Multifocal consolidations, nodularity and ground-glass opacities with septal thickening throughout the left lung.  Lymphadenopathy Pleural Fluid Cx 6/16 - abundant WBC, abundant gram positive cocci -> strep intermedius Pleural Fluid LDH 6/16 - >10,000 Pleural Fluid Glucose 6/16 - 43 Pleural Fluid Cytology 6/15 - No malignant cells identified, acute inflammation  Resolved Hospital Problem List:   Acute hypoxic respiratory failure  Assessment & Plan:  Streptococcus intermedius empyema, POA -Pansensitive  Large left sided pleural effusion, POA -status post large bore left-sided chest tube Left hilar mass Left pleural mass Hilar mediastinal adenopathy and cervical lymphadenopathy -Patient has been evaluated by cardiothoracic surgery and no role of VATS identified currently.  Lymphadenopathy is likely secondary to reactive to infection P: Continue to monitor chest tube output, 200 mL reported overnight Routine chest tube care Chest x-ray daily Encourage pulmonary hygiene Frequent use of I-S and flutter valve Mobilize as able Remains on Unasyn, per ID  Projected to need 4 weeks of antibiotic therapy  Best Practice:  Per primary  SIGNATURE  Cadan Maggart D. Tiburcio Pea, NP-C [facility] Personal contact information can be found on Amion  07/06/2020, 2:13 PM

## 2020-07-06 NOTE — Progress Notes (Signed)
PROGRESS NOTE    Jamie Carr  ZOX:[id number] DOB: 1955-06-28 DOA: 06/30/2020 PCP: Henreitta Leber, PA-C   Brief Narrative:  The patient is a 65 year old thin Caucasian female with a past medical history significant for but not limited to prior thyroid disease and tremors as well as other comorbidities who presented to the ED with ongoing dyspnea for 2 to 3 weeks, cough and left-sided chest pain.  Her symptoms progressively started worsening and she was seen in the [facility] ER at [facility] and initial evaluation she is found to be tachypneic with a chest x-ray and chest CT noting complete opacification of the left hemipneumothorax.  CT noted a large left pleural effusion with concern for tumor at the hilum and pleural-based mass.  The case was discussed with pulmonary and the decision was made to transfer the patient to [facility] for further evaluation management.  In the ED she had a left-sided chest tube placed and 1 L was drained and this was and subsequently sent to the hospital.  Of note her Pleurx catheter stopped working and had to be exchanged out by pulmonary today and subsequently she was complaining of pain so she was placed on Precedex drip and became hypotensive.  Pulmonary is managing closely with a low threshold to start pressors subsequently improved and was weaned to room air.   Repeat CT Scan done and showed Abdominal Fluid Collection. IR consulted drained the fluid. ID consulted for further evaluation and recommendations and they are recommending at least 4 weeks of antibiotics and a prolonged treatment.  They recommended continuing Unasyn for the time being and finding an oral equivalent at discharge.  Given the pleural mass noted on the CT scan ID recommended obtaining a CT surgery evaluation for further management and asked them to weigh in if she needs a VATS for decortication of the thick rind from infection; I spoke with Dr. Cornelius Moras extensively today and he felt that there is no role for CT  surgery involvement in the patient's case and states that at some point she may need a biopsy but at this point she will not need anything as she is a pleural drain in place which is draining appropriately.  He recommended if she needed a biopsy doing a bronchoscopy biopsy but he felt that she was not improving on antibiotics and recommended medical treatment.  Patient is feeling better. Ambulatory Home O2 Screen done showed that she desaturated on Ambulation but was ok on Room Air. PT recommending no follow up. Still draining outptut from Chest Tube. Per PCCM if output is less thant 150-200 cc, CCM to remove Tube in the next few days. Dr. Delton Coombes to evaluate in the am and follow repeat scan and decide about outpatient vs. Inpatient bronchoscopy.   Output is decreasing so will defer to Pulmonary when to remove CT. Patient still has some pain at chest tube insertion site and does not want to take Ibuprofen so will add Robaxin.   Assessment & Plan:   Principal Problem:   Pleural effusion Active Problems:   Malnutrition of moderate degree   Empyema (HCC)  Acute Respiratory Failure with hypoxia in the setting of Large Left Sided pleural effusion with concern for underlying malignancy but now the setting of strep intermedius empyema, present on admission -Admitted to the SDU  -Initially suspected malignant pleural effusion however the pleural fluid analysis showed no malignant cells and did show inflammation -Cultures are growing strep intermedius -Patient had a chest tube placement yesterday in the ED  however it failed because it failed to drain due to likely blockage and kinking -Will follow up on her pleural fluid cell count, pH, protein, LDH and cytology cultures; pleural fluid color was obtained milky with 16,000 total nucleated cell count and turbid appearance -Pulmonary was consulted for further assistance and evaluation -Because of fevers and leukocytosis of empirically added IV ceftriaxone and  azithromycin -Pulmonary Dr. Katrinka Blazing reevaluated and pulled initial chest tube and replaced it and now it is draining appropriately -WBC went from 29.3 -> 23.0 -> 21.7 -> 13.7 -> 8.0 and is now 13.1 -Cultures from it is growing Streptococcus intermedius which is pansensitive so she was de-escalated to IV Unasyn from IV ceftriaxone -SpO2: 94 % O2 Flow Rate (L/min): 2 L/min; now is off of supplemental oxygen via nasal cannula but desaturated on Home O2 Screen -Continuous pulse oximetry maintain O2 saturation greater 90% -Continue supplemental oxygen via nasal cannula and wean O2 as tolerated; pulmonary recommending continue chest tube and wean FiO2 for pulse ox greater than 88 --Repeat CT Scan showed "Left lateral approach thoracostomy tube with tip located in the major fissure, with a small moderate left pneumothorax. There is extensive nodular thickening of the parietal and visceral pleura throughout the left hemithorax with incomplete re-expansion of the left lung suggestive of the entrapment.  Similar appearance of the mass like area along the left anterior abdominal measuring 4 cm which is favored  to represent loculated fluid along the costophrenic angle as opposed to a discrete chest wall mass. Multifocal consolidations, nodularity and ground-glass opacities with septal thickening throughout the left lung. Prominent to mildly enlarged lower cervical, mediastinal, and left axillary lymph nodes are again visualized and appears stable. Trace upper abdominal, predominantly perihepatic, free fluid.  Diffuse subcutaneous edema with left chest wall subcutaneous emphysema. Aortic atherosclerosis." -Because of her empyema pulmonary recommending evaluating for head and neck origin especially all infections and they are getting an echocardiogram given the rare incidence of endocarditis but also the negative CT of the chest to assess if she needs intrapleural lytics but Pulmonary reviewed  Scan and will hold off  intrapleural Lytics.  -ID consulted for further evaluation and Recc's; ID recommending a prolonged treatment with 4 weeks of antibiotics and continue on ampicillin sulbactam for the time being and finding an oral equivalent the closest time of discharge -ID recommended also obtaining a CT surgery consult I spoke with Dr. Cornelius Moras.  I spoke with Dr. Cornelius Moras extensively today and he felt that there is no role for CT surgery involvement in the patient's case and states that at some point she may need a biopsy but at this point she will not need anything as she is a pleural drain in place which is draining appropriately.  He recommended if she needed a biopsy doing a bronchoscopy biopsy but he felt that she was not improving on antibiotics and recommended medical treatment. -Pain control with Acetaminophen, Ibuprofen, and Oxycodone IR; Start Robaxin  -Pulmonary Managing Chest tube and recommending removing in the next day or two if output is less than 150-200 cc and may remove today and will defer to them  -Dr. Marchelle Gearing is to sign out to Dr. Delton Coombes who is the interventional bronchoscopy specialist and he will determine whether to scan the patient's chest in a few months and decide about the role for bronchoscopy inpatient versus outpatient -Need to repeat ambulatory home O2 screen prior to discharge given that she did not desaturate today  Hypotension -In the setting of the  Precedex drip, now improved -Pulmonary is providing boluses for the patient -Continue to monitor blood pressures per protocol -Blood pressures have now stabilized and last blood pressure was 114/71  Normocytic Anemia -Anemia panel was checked and showed an iron level of 20, U IBC 163, TIBC 183 saturation ratios of 11%, ferritin level 912, folate level 12.0 and vitamin B12 of 926 -Patient's Hgb/Hct went from 10.5/30.6 -> 10.6/31.6 -> 9.4/28.4  -> 10.0/30.7 -> 9.8/30.3 -> 9.9/30.8 -> 9.1/28.2 -> 9.7/29.6 -Continue to monitor for signs and  symptoms of bleeding; currently no overt bleeding noted -Repeat CBC in a.m.  Abnormal LFTs -Likely reactive and now worsening  -AST went from 39 -> 50 -> 53 -> 92 -ALT went from 34 -> 44 -> 48 -> 79 -Continue to monitor and trend hepatic function panel and if continues to worsen will obtain a RUQ U/S and an Acute Hepatitis Panel; Will also check Acetaminophen Level and Salicylate Level  -Repeat CMP in the AM   Thrombocytosis -Likely Reactive from Above in setting of empyema -Patient's Platelet Count went from 635 -> 620 -> 571 -> 618 -> 627 -> 678 -> 540 -> 722 -Continue to Monitor and Trend -Repeat CBC in the AM  Hyponatremia -Patient's Na+ has improved to 138 yesterday and is now 137 -Initially there was a concern for malignancy however now is in the setting of empyema -Continue to Monitor and Trend -Repeat CMP in the AM  Hypomagnesemia -Replete. Mag Level is now 2.0 -Continue to Monitor and Replete as Necessary -Repeat CMP in the AM   Hypokalemia -Improved and K+ is now 3.8 -Continue to Monitor and Replete as Necessary  -Repeat CMP in the AM   Metabolic Acidosis -Mild and improved. Patient's CO2 is now 29, AG is 8, and Chloride Level is 100 -Continue to Monitor and Trend -Repeat CMP in the AM   Abnormal EKG -Was concerning for pericardial effusion -Echocardiogram and showed Normal EF of 60-65%  Left hilar mass and left pleural soft tissue mass with concern for malignancy however this is now a Streptococcus intermedius empyema -She had a pending pleural fluid studies which we will need to follow cytology for -Pulmonary recommending repeating chest CT once effusion is resolved for better characterization of chest possible masses -Per pulmonary if pleural fluid remains unremarkable/indeterminate may need a bronchoscopy for biopsy -Repeat CT Scan showed "Left lateral approach thoracostomy tube with tip located in the major fissure, with a small moderate left pneumothorax.  There is extensive nodular thickening of the parietal and visceral pleura throughout the left hemithorax with incomplete re-expansion of the left lung suggestive of the entrapment.  Similar appearance of the mass like area along the left anterior abdominal measuring 4 cm which is favored  to represent loculated fluid along the costophrenic angle as opposed to a discrete chest wall mass. Multifocal consolidations, nodularity and ground-glass opacities with septal thickening throughout the left lung. Prominent to mildly enlarged lower cervical, mediastinal, and left axillary lymph nodes are again visualized and appears stable. Trace upper abdominal, predominantly perihepatic, free fluid.  Diffuse subcutaneous edema with left chest wall subcutaneous emphysema. Aortic atherosclerosis." -ID consulted for further evaluation; they are recommending CT surgery to evaluate the patient to get up tissue biopsy to determine the cause of mass and further management and have them if she needs a VATS for decortication of thick rind from infection -CT Surgery felt no role for VATS and felt they did not need to consult as her Chest Tube is draining  well  -See above. Dr. Delton Coombes to evaluate for Bronchosocpy and pending evaluation today   Abdominal Loculated Fluid Collection -As above -Obtained IR eval and CT guided Aspiration which sent 10 mL  -Gram stain from 07/03/20 showed Abundant WBC Present both PMN and Mononuclear with Moderate Gram Positive Cocci; Cx showing NGTD at 3 Days   Non-Severe (Moderate) Malnutrition in the context of Acute Illness/Injury  -Nutritionist consulted for further evaluation and recommendations -The nutritionist team is recommending boost breeze once a day as well as Molli Posey 1.4 p.o. once a day and a multivitamin with minerals daily  DVT prophylaxis: Enoxaparin 40 mg sq q24h Code Status: FULL CODE  Family Communication: No family present at bedside  Disposition Plan: Pending further  clearance and evaluation by pulmonary; patient also need PT OT to further evaluate and treat and recommending no Follow up  Status is: Inpatient  Remains inpatient appropriate because:Ongoing diagnostic testing needed not appropriate for outpatient work up, Unsafe d/c plan, IV treatments appropriate due to intensity of illness or inability to take PO, and Inpatient level of care appropriate due to severity of illness  Dispo: The patient is from: Home              Anticipated d/c is to: Home              Patient currently is not medically stable to d/c.   Difficult to place patient No  Consultants:  Pulmonary IR ID Discussed with Dr. Cornelius Moras  Procedures: Chest Tube Insertion  ECHOCARDIOGRAM IMPRESSIONS     1. Left ventricular ejection fraction, by estimation, is 60 to 65%. The  left ventricle has normal function. The left ventricle has no regional  wall motion abnormalities. Left ventricular diastolic function could not  be evaluated.   2. Right ventricular systolic function is normal. The right ventricular  size is normal.   3. The mitral valve is normal in structure. No evidence of mitral valve  regurgitation. No evidence of mitral stenosis.   4. The aortic valve is grossly normal. Aortic valve regurgitation is not  visualized. No aortic stenosis is present.   FINDINGS   Left Ventricle: Left ventricular ejection fraction, by estimation, is 60  to 65%. The left ventricle has normal function. The left ventricle has no  regional wall motion abnormalities. The left ventricular internal cavity  size was normal in size. There is   no left ventricular hypertrophy. Left ventricular diastolic function  could not be evaluated.   Right Ventricle: The right ventricular size is normal. No increase in  right ventricular wall thickness. Right ventricular systolic function is  normal.   Left Atrium: Left atrial size was normal in size.   Right Atrium: Right atrial size was normal in  size.   Pericardium: There is no evidence of pericardial effusion.   Mitral Valve: The mitral valve is normal in structure. No evidence of  mitral valve regurgitation. No evidence of mitral valve stenosis.   Tricuspid Valve: The tricuspid valve is normal in structure. Tricuspid  valve regurgitation is trivial.   Aortic Valve: The aortic valve is grossly normal. Aortic valve  regurgitation is not visualized. No aortic stenosis is present.   Pulmonic Valve: The pulmonic valve was not well visualized. Pulmonic valve  regurgitation is not visualized.   Aorta: The aortic root and ascending aorta are structurally normal, with  no evidence of dilitation.   IAS/Shunts: The atrial septum is grossly normal.      LEFT VENTRICLE  PLAX 2D  LVIDd:         3.70 cm  LVIDs:         2.60 cm  LV PW:         1.00 cm  LV IVS:        0.90 cm  LVOT diam:     2.20 cm  LVOT Area:     3.80 cm      IVC  IVC diam: 1.70 cm   LEFT ATRIUM         Index  LA diam:    2.80 cm 1.77 cm/m      AORTA  Ao Root diam: 3.10 cm  Ao Asc diam:  3.30 cm      SHUNTS  Systemic Diam: 2.20 cm   Antimicrobials:  Anti-infectives (From admission, onward)    Start     Dose/Rate Route Frequency Ordered Stop   07/03/20 0600  Ampicillin-Sulbactam (UNASYN) 3 g in sodium chloride 0.9 % 100 mL IVPB        3 g 200 mL/hr over 30 Minutes Intravenous Every 6 hours 07/02/20 1409     07/01/20 1045  cefTRIAXone (ROCEPHIN) 2 g in sodium chloride 0.9 % 100 mL IVPB  Status:  Discontinued        2 g 200 mL/hr over 30 Minutes Intravenous Every 24 hours 07/01/20 0947 07/02/20 1409   07/01/20 1000  cefTRIAXone (ROCEPHIN) 1 g in sodium chloride 0.9 % 100 mL IVPB  Status:  Discontinued        1 g 200 mL/hr over 30 Minutes Intravenous Every 24 hours 06/30/20 1723 07/01/20 0947   07/01/20 1000  azithromycin (ZITHROMAX) 500 mg in sodium chloride 0.9 % 250 mL IVPB  Status:  Discontinued        500 mg 250 mL/hr over 60 Minutes  Intravenous Every 24 hours 06/30/20 1723 07/02/20 1409   06/30/20 0700  vancomycin (VANCOCIN) IVPB 1000 mg/200 mL premix        1,000 mg 200 mL/hr over 60 Minutes Intravenous  Once 06/30/20 0655 06/30/20 1134   06/30/20 0700  cefTRIAXone (ROCEPHIN) 2 g in sodium chloride 0.9 % 100 mL IVPB        2 g 200 mL/hr over 30 Minutes Intravenous  Once 06/30/20 0655 06/30/20 1017   06/30/20 0700  azithromycin (ZITHROMAX) 500 mg in sodium chloride 0.9 % 250 mL IVPB        500 mg 250 mL/hr over 60 Minutes Intravenous  Once 06/30/20 0655 06/30/20 1030        Subjective: Seen and examined at bedside and again was having some chest discomfort at her chest tube insertion site.  Did not want to take oxycodone and did not want take ibuprofen so we will try Robaxin.  She denies any nausea or vomiting.  No chest pain or shortness of breath.  No other concerns or plans at this time.  Objective: Vitals:   07/05/20 0515 07/05/20 1500 07/05/20 2125 07/06/20 0515  BP: 124/75 120/72 118/76 114/71  Pulse: 84 88 (!) 102 94  Resp: 20 20 18 20   Temp: 97.6 F (36.4 C) 97.7 F (36.5 C) 97.9 F (36.6 C) 98.4 F (36.9 C)  TempSrc: Oral Oral Oral Oral  SpO2: 100% 96% 100% 94%  Weight:      Height:        Intake/Output Summary (Last 24 hours) at 07/06/2020 1338 Last data filed at 07/06/2020 0900 Gross per 24 hour  Intake 480 ml  Output 550 ml  Net -70 ml    Filed Weights   06/30/20 0625 07/02/20 1812  Weight: 55.3 kg 58 kg   Examination: Physical Exam:  Constitutional: Thin Cachectic Caucasian female in NAD appears calm sitting in the chair bedside but slightly uncomfortable Eyes: Lids and conjunctivae normal, sclerae anicteric  ENMT: External Ears, Nose appear normal. Grossly normal hearing.  Neck: Appears normal, supple, no cervical masses, normal ROM, no appreciable thyromegaly; no JVD Respiratory: Diminished  to auscultation bilaterally with coarse breath sounds worse on the Left compared to the  Right with some rhonchi. No wheezing or crackles. Normal respiratory effort and patient is not tachypenic. No accessory muscle use. Unlabored breathing and not wearing supplemental O2 via [place]. Has  a Chest Tube in place.  Cardiovascular: RRR, no murmurs / rubs / gallops. S1 and S2 auscultated.  Mild Pedal edema Abdomen: Soft, non-tender, non-distended. Bowel sounds positive.  GU: Deferred. Musculoskeletal: No clubbing / cyanosis of digits/nails. No joint deformity upper and lower extremities.  Skin: No rashes, lesions, ulcers on a limited skin evaluation. No induration; Warm and dry.  Neurologic: CN 2-12 grossly intact with no focal deficits. Romberg sign cerebellar reflexes not assessed.  Psychiatric: Normal judgment and insight. Alert and oriented x 3. Normal mood and appropriate affect.   Data Reviewed: I have personally reviewed following labs and imaging studies  CBC: Recent Labs  Lab 07/02/20 0257 07/03/20 0457 07/04/20 0550 07/05/20 0554 07/06/20 0953  WBC 21.5* 13.7* 13.3* 8.0 13.1*  NEUTROABS 16.6* 10.2* 10.1* 4.9 10.2*  HGB 10.0* 9.8* 9.9* 9.1* 9.7*  HCT 30.7* 30.3* 30.8* 28.2* 29.6*  MCV 95.3 95.3 96.6 95.6 94.9  PLT 618* 627* 678* 540* 722*    Basic Metabolic Panel: Recent Labs  Lab 07/02/20 0257 07/03/20 0457 07/04/20 0550 07/05/20 0554 07/06/20 0953  NA 132* 135 134* 138 137  K 4.4 4.0 3.0* 4.0 3.8  CL 103 103 98 105 100  CO2 21* GLUCOSE 79 96 92 99 98  BUN CREATININE 0.73 0.68 0.66 0.50 0.62  CALCIUM 9.3 9.0 8.1* 8.4* 9.0  MG 1.8 1.9 1.8 1.8 2.0  PHOS 3.7 3.6 2.9 3.7 4.1    GFR: Estimated Creatinine Clearance: 56.9 mL/min (by C-G formula based on SCr of 0.62 mg/dL). Liver Function Tests: Recent Labs  Lab 07/02/20 0257 07/03/20 0457 07/04/20 0550 07/05/20 0554 07/06/20 0953  AST 27 39 50* 53* 92*  ALT 33 34 44 48* 79*  ALKPHOS 142* 119 111 92 114  BILITOT 0.9 0.6 0.6 0.6 0.5  PROT 5.8* 5.4* 5.4* 5.2* 6.2*  ALBUMIN  1.8* 1.7* 1.9* 1.9* 2.3*    No results for input(s): LIPASE, AMYLASE in the last 168 hours. No results for input(s): AMMONIA in the last 168 hours. Coagulation Profile: No results for input(s): INR, PROTIME in the last 168 hours. Cardiac Enzymes: No results for input(s): CKTOTAL, CKMB, CKMBINDEX, TROPONINI in the last 168 hours. BNP (last 3 results) No results for input(s): PROBNP in the last 8760 hours. HbA1C: No results for input(s): HGBA1C in the last 72 hours. CBG: Recent Labs  Lab 07/05/20 1213 07/05/20 1734 07/05/20 2122 07/06/20 0740 07/06/20 1139  GLUCAP 84 85 93 107* 84    Lipid Profile: No results for input(s): CHOL, HDL, LDLCALC, TRIG, CHOLHDL, LDLDIRECT in the last 72 hours. Thyroid Function Tests: No results for input(s): TSH, T4TOTAL, FREET4, T3FREE, THYROIDAB in the last 72 hours. Anemia Panel: No  results for input(s): VITAMINB12, FOLATE, FERRITIN, TIBC, IRON, RETICCTPCT in the last 72 hours.  Sepsis Labs: Recent Labs  Lab 06/30/20 0737 06/30/20 1114  LATICACIDVEN 1.2 1.3     Recent Results (from the past 240 hour(s))  Resp Panel by RT-PCR (Flu A&B, Covid) Nasopharyngeal Swab     Status: None   Collection Time: 06/30/20  6:42 AM   Specimen: Nasopharyngeal Swab; Nasopharyngeal(NP) swabs in vial transport medium  Result Value Ref Range Status   SARS Coronavirus 2 by RT PCR NEGATIVE NEGATIVE Final    Comment: (NOTE) SARS-CoV-2 target nucleic acids are NOT DETECTED.  The SARS-CoV-2 RNA is generally detectable in upper respiratory specimens during the acute phase of infection. The lowest concentration of SARS-CoV-2 viral copies this assay can detect is 138 copies/mL. A negative result does not preclude SARS-Cov-2 infection and should not be used as the sole basis for treatment or other patient management decisions. A negative result may occur with  improper specimen collection/handling, submission of specimen other than nasopharyngeal swab, presence  of viral mutation(s) within the areas targeted by this assay, and inadequate number of viral copies(<138 copies/mL). A negative result must be combined with clinical observations, patient history, and epidemiological information. The expected result is Negative.  Fact Sheet for Patients:  BloggerCourse.com  Fact Sheet for Healthcare Providers:  SeriousBroker.it  This test is no t yet approved or cleared by the [place] FDA and  has been authorized for detection and/or diagnosis of SARS-CoV-2 by FDA under an Emergency Use Authorization (EUA). This EUA will remain  in effect (meaning this test can be used) for the duration of the COVID-19 declaration under Section 564(b)(1) of the Act, 21 U.S.C.section 360bbb-3(b)(1), unless the authorization is terminated  or revoked sooner.       Influenza A by PCR NEGATIVE NEGATIVE Final   Influenza B by PCR NEGATIVE NEGATIVE Final    Comment: (NOTE) The Xpert Xpress SARS-CoV-2/FLU/RSV plus assay is intended as an aid in the diagnosis of influenza from Nasopharyngeal swab specimens and should not be used as a sole basis for treatment. Nasal washings and aspirates are unacceptable for Xpert Xpress SARS-CoV-2/FLU/RSV testing.  Fact Sheet for Patients: BloggerCourse.com  Fact Sheet for Healthcare Providers: SeriousBroker.it  This test is not yet approved or cleared by the [place] FDA and has been authorized for detection and/or diagnosis of SARS-CoV-2 by FDA under an Emergency Use Authorization (EUA). This EUA will remain in effect (meaning this test can be used) for the duration of the COVID-19 declaration under Section 564(b)(1) of the Act, 21 U.S.C. section 360bbb-3(b)(1), unless the authorization is terminated or revoked.  Performed at [facility], [address], [place], [place] [address]   Blood culture  (routine x 2)     Status: None   Collection Time: 06/30/20  7:37 AM   Specimen: Left Antecubital; Blood  Result Value Ref Range Status   Specimen Description   Final    LEFT ANTECUBITAL Performed at [facility], [address], [place], [place] [address]    Special Requests   Final    Blood Culture adequate volume Performed at [facility], [address] [place], [place] [address]    Culture   Final    NO GROWTH 5 DAYS Performed at [facility], [address], [place], [place] [address]    Report Status 07/05/2020 FINAL  Final  Blood culture (routine x 2)     Status: None   Collection  Time: 06/30/20  7:42 AM   Specimen: Right Antecubital; Blood  Result Value Ref Range Status   Specimen Description   Final    RIGHT ANTECUBITAL Performed at [facility], [address] [place], [place] [address]    Special Requests   Final    Blood Culture adequate volume Performed at [facility], [address], [place], [place] [address]    Culture   Final    NO GROWTH 5 DAYS Performed at [facility], [address] [place], [place] [address]    Report Status 07/05/2020 FINAL  Final  Body fluid culture w Gram Stain     Status: None   Collection Time: 06/30/20 10:01 AM   Specimen: Pleural Fluid  Result Value Ref Range Status   Specimen Description   Final    PLEURAL Performed at [facility], [address], [place], [place] [address]    Special Requests   Final    NONE Performed at [facility], [address], [place], [place] [address]    Gram Stain   Final    ABUNDANT WBC PRESENT,BOTH PMN AND MONONUCLEAR ABUNDANT GRAM POSITIVE COCCI Performed at [facility], [address], [place], [place] [address]    Culture ABUNDANT STREPTOCOCCUS INTERMEDIUS  Final   Report Status 07/02/2020 FINAL  Final   Organism ID, Bacteria STREPTOCOCCUS  INTERMEDIUS  Final      Susceptibility   Streptococcus intermedius - MIC*    PENICILLIN <=0.06 SENSITIVE Sensitive     CEFTRIAXONE 0.25 SENSITIVE Sensitive     ERYTHROMYCIN <=0.12 SENSITIVE Sensitive     LEVOFLOXACIN 0.5 SENSITIVE Sensitive     VANCOMYCIN 0.5 SENSITIVE Sensitive     * ABUNDANT STREPTOCOCCUS INTERMEDIUS  MRSA PCR Screening     Status: None   Collection Time: 06/30/20  4:17 PM   Specimen: Nasopharyngeal  Result Value Ref Range Status   MRSA by PCR NEGATIVE NEGATIVE Final    Comment:        The GeneXpert MRSA Assay (FDA approved for NASAL specimens only), is one component of a comprehensive MRSA colonization surveillance program. It is not intended to diagnose MRSA infection nor to guide or monitor treatment for MRSA infections. Performed at [facility], [address], [place], [place] [address]   Body fluid culture w Gram Stain     Status: None   Collection Time: 07/01/20 12:30 PM   Specimen: Pleura; Body Fluid  Result Value Ref Range Status   Specimen Description   Final    PLEURAL LT Performed at [facility], [address] [place], [place] [address]    Special Requests   Final    NONE Performed at [facility], [address], [place], [place] [address]    Gram Stain   Final    ABUNDANT WBC PRESENT,BOTH PMN AND MONONUCLEAR ABUNDANT GRAM POSITIVE COCCI    Culture   Final    ABUNDANT STREPTOCOCCUS INTERMEDIUS SUSCEPTIBILITIES PERFORMED ON PREVIOUS CULTURE WITHIN THE LAST 5 DAYS. Performed at [facility], [address], [place], [place] [address]    Report Status 07/03/2020 FINAL  Final  Aerobic/Anaerobic Culture w Gram Stain (surgical/deep wound)     Status: None (Preliminary result)   Collection Time: 07/03/20  4:14 PM   Specimen: Abscess  Result Value Ref Range Status   Specimen Description   Final    ABSCESS Performed at [facility], [address]  [place], [place] [address]    Special Requests   Final    Normal Performed at [facility], [address] [place], [place] [address]    Gram Stain   Final    ABUNDANT WBC PRESENT,BOTH PMN AND MONONUCLEAR MODERATE GRAM POSITIVE COCCI    Culture   Final    NO GROWTH 3 DAYS NO ANAEROBES ISOLATED; CULTURE IN PROGRESS FOR 5 DAYS Performed at [facility], [address], [place], [place] [address]    Report Status PENDING  Incomplete     RN Pressure Injury Documentation:     Estimated body mass index is 23.39 kg/m as calculated from the following:   Height as of this encounter:  (1.575 m).   Weight as of this encounter: 58 kg.  Malnutrition Type:  Nutrition Problem: Moderate Malnutrition Etiology: acute illness   Malnutrition Characteristics:  Signs/Symptoms: mild fat depletion, mild muscle depletion   Nutrition Interventions:  Interventions: Boost Breeze, MVI, Other (Comment) [employer] 1.4)   Radiology Studies: DG CHEST PORT 1 VIEW  Result Date: 07/05/2020 CLINICAL DATA:  Shortness of breath. EXAM: PORTABLE CHEST 1 VIEW COMPARISON:  July 04, 2020 FINDINGS: Cardiomediastinal silhouette is normal. Mediastinal contours appear intact. Unchanged position of the left chest tube. Slight enlargement of the left pneumothorax with marked thickening of the pleura, linear and nodular interstitial thickening throughout the left lung. Small left pleural effusion. Osseous structures are without acute abnormality. Soft tissues are grossly normal. IMPRESSION: 1. Slight enlargement of the left pneumothorax with marked thickening of the pleura, and associated interstitial thickening. 2. Small left pleural effusion. Electronically Signed   By: Ted Mcalpine M.D.   On: 07/05/2020 11:44    Scheduled Meds:  chlorhexidine  15 mL Mouth Rinse BID   Chlorhexidine Gluconate Cloth  6 each Topical Daily   docusate  100 mg Per Tube BID   enoxaparin (LOVENOX) injection   40 mg Subcutaneous Q24H   feeding supplement  1 Container Oral Q24H   feeding supplement ([employer] STANDARD 1.4)  325 mL Oral Daily   loratadine  10 mg Oral QHS   mouth rinse  15 mL Mouth Rinse q12n4p   multivitamin with minerals  1 tablet Oral Daily   polyethylene glycol  17 g Per Tube Daily   vitamin C  250 mg Oral Daily   Continuous Infusions:  sodium chloride     ampicillin-sulbactam (UNASYN) IV 3 g (07/06/20 1316)    LOS: 6 days   Merlene Laughter, DO [facility] PAGER is on AMION  If 7PM-7AM, please contact night-coverage [email or web address]

## 2020-07-07 ENCOUNTER — Inpatient Hospital Stay (HOSPITAL_COMMUNITY): Payer: BC Managed Care – PPO

## 2020-07-07 DIAGNOSIS — J869 Pyothorax without fistula: Secondary | ICD-10-CM | POA: Diagnosis not present

## 2020-07-07 LAB — SALICYLATE LEVEL: Salicylate Lvl: <7 mg/dL — ABNORMAL LOW (ref 7.0–30.0)

## 2020-07-07 LAB — COMPREHENSIVE METABOLIC PANEL
ALT: 67 U/L — ABNORMAL HIGH (ref 0–44)
AST: 68 U/L — ABNORMAL HIGH (ref 15–41)
Albumin: 2 g/dL — ABNORMAL LOW (ref 3.5–5.0)
Alkaline Phosphatase: 93 U/L (ref 38–126)
Anion gap: 8 (ref 5–15)
BUN: 6 mg/dL — ABNORMAL LOW (ref 8–23)
CO2: 27 mmol/L (ref 22–32)
Calcium: 8.6 mg/dL — ABNORMAL LOW (ref 8.9–10.3)
Chloride: 102 mmol/L (ref 98–111)
Creatinine, Ser: 0.58 mg/dL (ref 0.44–1.00)
GFR, Estimated: >60 mL/min (ref >60)
Glucose, Bld: 92 mg/dL (ref 70–99)
Potassium: 3.3 mmol/L — ABNORMAL LOW (ref 3.5–5.1)
Sodium: 137 mmol/L (ref 135–145)
Total Bilirubin: 0.3 mg/dL (ref 0.3–1.2)
Total Protein: 5.4 g/dL — ABNORMAL LOW (ref 6.5–8.1)

## 2020-07-07 LAB — CBC WITH DIFFERENTIAL/PLATELET
Abs Immature Granulocytes: 0.19 10*3/uL — ABNORMAL HIGH (ref 0.00–0.07)
Basophils Absolute: 0 10*3/uL (ref 0.0–0.1)
Basophils Relative: 0 %
Eosinophils Absolute: 0 10*3/uL (ref 0.0–0.5)
Eosinophils Relative: 0 %
HCT: 25.7 % — ABNORMAL LOW (ref 36.0–46.0)
Hemoglobin: 8.4 g/dL — ABNORMAL LOW (ref 12.0–15.0)
Immature Granulocytes: 2 %
Lymphocytes Relative: 19 %
Lymphs Abs: 1.8 10*3/uL (ref 0.7–4.0)
MCH: 31.1 pg (ref 26.0–34.0)
MCHC: 32.7 g/dL (ref 30.0–36.0)
MCV: 95.2 fL (ref 80.0–100.0)
Monocytes Absolute: 0.9 10*3/uL (ref 0.1–1.0)
Monocytes Relative: 9 %
Neutro Abs: 6.6 10*3/uL (ref 1.7–7.7)
Neutrophils Relative %: 70 %
Platelets: 511 10*3/uL — ABNORMAL HIGH (ref 150–400)
RBC: 2.7 MIL/uL — ABNORMAL LOW (ref 3.87–5.11)
RDW: 13.9 % (ref 11.5–15.5)
WBC: 9.5 10*3/uL (ref 4.0–10.5)
nRBC: 0 % (ref 0.0–0.2)

## 2020-07-07 LAB — MAGNESIUM: Magnesium: 1.9 mg/dL (ref 1.7–2.4)

## 2020-07-07 LAB — GLUCOSE, CAPILLARY
Glucose-Capillary: 92 mg/dL (ref 70–99)
Glucose-Capillary: 102 mg/dL — ABNORMAL HIGH (ref 70–99)
Glucose-Capillary: 103 mg/dL — ABNORMAL HIGH (ref 70–99)
Glucose-Capillary: 104 mg/dL — ABNORMAL HIGH (ref 70–99)

## 2020-07-07 LAB — PHOSPHORUS: Phosphorus: 4.3 mg/dL (ref 2.5–4.6)

## 2020-07-07 LAB — ACETAMINOPHEN LEVEL: Acetaminophen (Tylenol), Serum: <10 ug/mL — ABNORMAL LOW (ref 10–30)

## 2020-07-07 NOTE — Progress Notes (Signed)
Nutrition Follow-up  DOCUMENTATION CODES:   Non-severe (moderate) malnutrition in context of acute illness/injury  INTERVENTION:  - continue Boost Breeze once/day and [employer] once/day.    NUTRITION DIAGNOSIS:   Moderate Malnutrition related to acute illness as evidenced by mild fat depletion, mild muscle depletion. -ongoing  GOAL:   Patient will meet greater than or equal to 90% of their needs -unmet on average  MONITOR:   PO intake, Supplement acceptance, Labs, Weight trends   ASSESSMENT:   65 year old female with medical history of prior thyroid disease, tremors, and epilepsy. She presented to the ED due to 2-3 week hx of dyspnea, cough, and L-sided chest pain which have progressively worsened over time. In the ED she was noted to be tachypneic and CXR and CT chest showed complete opacification of L hemopneumothorax and showed a large L pleural effusion with concern for tumor.  Patient out of the room to Diagnostic Radiology.   Recently documented meal intakes: 6/18- 30% of breakfast and 75% of lunch (total of 475 kcal and 20 grams protein) 6/19- 75% of breakfast, 100% of lunch, 75% of dinner (total of 933 kcal and 53 grams protein) 6/20- 75% of breakfast (141 kcal and 5 grams protein)   She has been accepting [employer] 1.4 100% of the time offered (each supplement provides 455 kcal and 20 grams protein) and has accepted 5 of the 6 bottles of Boost Breeze offered (each carton provides 250 kcal and 9 grams protein).   She has not been weighed since 6/16. Mild pitting edema to BLE documented in the edema section of flow sheet.  Per notes: - large L-sided pleural effusion s/p chest tube  - L hilar mass - hilar mediastinal adenopathy and cervical lymphadenopathy   Labs reviewed; CBGs: 107 and 84 mg/dl, AST elevated.  Medications reviewed; 100 mg colace BID, 1 tablet multivitamin with minerals/day, 17 g miralax/day, 250 mg ascorbic acid/day.     Diet Order:   Diet  Order             Diet regular Room service appropriate? Yes; Fluid consistency: Thin  Diet effective now                   EDUCATION NEEDS:   No education needs have been identified at this time  Skin:  Skin Assessment: Reviewed RN Assessment  Last BM:  6/20 (type 6 x1)  Height:   Ht Readings from Last 1 Encounters:  06/30/20 5\' 2"  (1.575 m)    Weight:   Wt Readings from Last 1 Encounters:  07/02/20 58 kg      Estimated Nutritional Needs:  Kcal:  1700-1900 kcal Protein:  85-100 grams Fluid:  >/= 2 L/day      07/04/20, MS, RD, LDN, CNSC Inpatient Clinical Dietitian RD pager # available in AMION  After hours/weekend pager # available in [facility]

## 2020-07-07 NOTE — Progress Notes (Signed)
PCCM Progress Note  Phone consultation held with Dr. Cliffton Asters regarding management for of complicated empyema. With persistent improvement and continued source control with chest tube collective decision was made to proceed with repeat CT chest to better visualize lung and determine if adequate drainage has occurred. If fluid persist, PCCM will reengage conversation with cardiothoracic surgery to determine best next steps.   Order written for CT chest with contrast.   Patient updated and agrees with plan   Jamie Carr D. Tiburcio Pea, NP-C [facility] Personal contact information can be found on Amion  07/07/2020, 5:54 PM

## 2020-07-07 NOTE — Progress Notes (Addendum)
NAME:  Archana Eckman MRN:  [id number] DOB:  January 14, 1956 LOS: 7 ADMISSION DATE:  06/30/2020 CONSULTATION DATE:  06/30/2020 REFERRING MD: Dr. Wilkie Aye - EDP CHIEF COMPLAINT:  SOB, Pleural effusion   History of Present Illness:  65 year old female with PMHx significant for epilepsy, thyroid disease and tremors who presented to [facility] ED with complaints of dyspnea and left-sided chest pain x 2-3 weeks (worse with deep breathing and coughing, radiating to L shoulder). Dyspnea was progressive prompting her to seek medical attention 6/14.   In the ED, patient was tachypneic but no hypoxia was observed. CXR demonstrated complete opacification of the left hemithorax suggestive of large pleural effusion. CT Chest obtained to further characterize effusion showed large left-sided pleural effusion as well as concern for tumor at the left hilum and a pleural based mass.   PCCM asked to consult for effusion management.   Pertinent Medical History:  Epilepsy, thyroid disease, tremor, myopia, presbyopia  Significant Hospital Events:  6/14 - Admit to [facility], transfer to [facility], [facility] consult for management of effusion, CT placed by EDP 6/15 - Increased pain at CT insertion site, CXR demonstrating possible kink at insertion site/L chest wall. Large-bore CT placed at bedside (Dr. Katrinka Blazing). Episode of respiratory distress with hypotension post-CT placement, thought to be med effect versus reexpansion pulm edema. Improved with Fentanyl/Precedex, weaned off of Precedex. BPs improved with fluid resuscitation.  Culture ultimately grew Streptococcus intermedius [LDH greater than 10,000].  No malignant cells 6/16 - Improved CT drainage overnight, stable O2 needs. CXR with marginally increased PTX, decreased effusion, asymmetrical pulmonary edema on L. Transferred to floor.  Was able to come off oxygen 6/17 - Improved respiratory status and O2 needs. CT Chest demonstrating 3.5 x 4cm loculated collection along with pneumothorax  ex vacuo vs. Mass in the abdominal wall ->  IR to attempt aspiration today -> no growth as of 6/19.  Noticed to be on 2 L nasal cannula which she says was Comfort.  Infectious disease consultation recommending 4 weeks of antibiotics and continue Unasyn for now. 07/04/2020: [facility] conversation with thoracic surgery: No role for VATS.  Recommend bronchoscopy consideration for mediastinal nodes. 6/20 No acute events overnight and patient is now on RA and feels well. Large bore CT remains in place and drained in the last 24hrs. PTX slight worse on CXR 6/21 no recorded chest tube output overnight   Interim History / Subjective:  Sitting up in bedside recline  Denies any acute complaints   Objective:  Blood pressure 126/87, pulse 84, temperature 98.2 F (36.8 C), temperature source Oral, resp. rate 20, height 5\' 2"  (1.575 m), weight 58 kg, SpO2 97 %.       No intake or output data in the 24 hours ending 07/07/20 0948  Filed Weights   06/30/20 0625 07/02/20 1812  Weight: 55.3 kg 58 kg    Physical Exam General: Pleasant middle aged thin female sitting up in bedside recline in NAD HEENT: [place]/AT, MM pink/moist, PERRL,  Neuro: Alert and oriented x3, non-focal  CV: s1s2 regular rate and rhythm, no murmur, rubs, or gallops,  PULM:  clear to ascultation bilaterally, slightly diminished left side, no increased work of breathing  GI: soft, bowel sounds active in all 4 quadrants, non-tender, non-distended Extremities: warm/dry, no edema  Skin: no rashes or lesions  Labs/imaging that I have personally reviewed:  CXR 6/16 Chest tube on left, unchanged, with left-sided pneumothorax, marginally larger. Airspace opacity consistent with pneumonia left base. Asymmetric pulmonary  edema on the left is stable. CXR 6/17 Empyema with stable left chest tube. Poor re-expansion of the left lung. Small volume left pneumothorax/pleural gas superimposed on diffuse pleural thickening. CT Chest 6/17 Left  lateral approach thoracostomy tube with tip located in the major fissure, with a small moderate left pneumothorax. There is extensive nodular thickening of the parietal and visceral pleura throughout the left hemithorax with incomplete re-expansion of the left lung suggestive of the entrapment. Mass like area along the left anterior abdominal measuring 4 cm which is favored to represent loculated fluid along the costophrenic angle as opposed to a discrete chest wall mass. Multifocal consolidations, nodularity and ground-glass opacities with septal thickening throughout the left lung.  Lymphadenopathy Pleural Fluid Cx 6/16 - abundant WBC, abundant gram positive cocci -> strep intermedius Pleural Fluid LDH 6/16 - >10,000 Pleural Fluid Glucose 6/16 - 43 Pleural Fluid Cytology 6/15 - No malignant cells identified, acute inflammation  Resolved Hospital Problem List:   Acute hypoxic respiratory failure  Assessment & Plan:  Streptococcus intermedius empyema, POA -Pansensitive  Large left sided pleural effusion, POA -status post large bore left-sided chest tube Left hilar mass Left pleural mass Hilar mediastinal adenopathy and cervical lymphadenopathy -Patient has been evaluated by cardiothoracic surgery and no role of VATS identified currently.  Lymphadenopathy is likely secondary to reactive to infection P: Routine chest tube care Strict chest tube output monitoring  Continue suction  Frequent use of IS and flutter  Mobilize as able  Continue antibiotics  Reengage CTCS for need of decortication  Exchange drainage system   Best Practice:  Per primary  SIGNATURE  Whitney D. Tiburcio Pea, NP-C [facility] Personal contact information can be found on Amion  07/07/2020, 9:48 AM     Attending Note:  I have examined patient, reviewed labs, studies and notes.   Pt feels comfortable, up to chair It doesn't look like there has been much chest tube drainage in last 24h, but  documentation difficult to tell.   Vitals:   07/06/20 0515 07/06/20 1410 07/06/20 2153 07/07/20 0526  BP: 114/71 118/70 124/70 126/87  Pulse: 94 98  84  Resp: 20  20 20   Temp: 98.4 F (36.9 C) 98.4 F (36.9 C) 97.8 F (36.6 C) 98.2 F (36.8 C)  TempSrc: Oral Oral Oral Oral  SpO2: 94% 95% 95% 97%  Weight:      Height:       Thin woman, up to chair on RA. No distress. Tan/yellow chest tube drainage in tubing. Lungs clear on R, coarse on L. Heart regular. Abdomen benign.   L Strep intermedius empyema with chest tube in place, in complete re-expansion L lung. Unclear how much output last 24h, looks to be less than prior day but not well marked. Plan to change her pleurovac and continue to suction so we can measure accurately. Can consider possible CT removal output < 50cc/24h. Have considered possible DNase / tPA instillation because there is inflammation vs rind on the visceral pleural surface. Literature is equivocal about whether this would be beneficial. Often the trapped lung can re-expand if we treat the underlying infection and get adequate drainage. Sometimes VATS decortication is needed.  -continue abx as ordered -will change pleurovac today and follow output -consider lytics/DNase but unclear whether truly indicated -once we are able to get chest tube out, will need to follow outpt and have repeat CT chest in about 1 month to assess for L lung re-expansion. If there is persistent trapped  lung then would need to consider pros/cons of VATS decortication.  -in the meantime, will discuss case w thoracic surgery to see if there is any benefit to involve them to consider early VATS as opposed to the timing above.     Levy Pupa, MD, PhD 07/07/2020, 11:43 AM [facility] [phone] or if no answer [phone]

## 2020-07-07 NOTE — Progress Notes (Addendum)
PROGRESS NOTE    Jamie Carr  ZOX:[id number] DOB: 1955-12-10 DOA: 06/30/2020 PCP: Henreitta Leber, PA-C   Brief Narrative:  The patient is a 65 year old thin Caucasian female with a past medical history significant for but not limited to prior thyroid disease and tremors as well as other comorbidities who presented to the ED with ongoing dyspnea for 2 to 3 weeks, cough and left-sided chest pain.  Her symptoms progressively started worsening and she was seen in the [facility] ER at [facility] and initial evaluation she is found to be tachypneic with a chest x-ray and chest CT noting complete opacification of the left hemipneumothorax.  CT noted a large left pleural effusion with concern for tumor at the hilum and pleural-based mass.  The case was discussed with pulmonary and the decision was made to transfer the patient to [facility] for further evaluation management.  In the ED she had a left-sided chest tube placed and 1 L was drained and this was and subsequently sent to the hospital.  Of note her Pleurx catheter stopped working and had to be exchanged out by pulmonary today and subsequently she was complaining of pain so she was placed on Precedex drip and became hypotensive.  Pulmonary is managing closely with a low threshold to start pressors subsequently improved and was weaned to room air.   Repeat CT Scan done and showed Abdominal Fluid Collection. IR consulted drained the fluid. ID consulted for further evaluation and recommendations and they are recommending at least 4 weeks of antibiotics and a prolonged treatment.  They recommended continuing Unasyn for the time being and finding an oral equivalent at discharge.  Given the pleural mass noted on the CT scan ID recommended obtaining a CT surgery evaluation for further management and asked them to weigh in if she needs a VATS for decortication of the thick rind from infection; I spoke with Dr. Cornelius Moras extensively today and he felt that there is no role for CT  surgery involvement in the patient's case and states that at some point she may need a biopsy but at this point she will not need anything as she is a pleural drain in place which is draining appropriately.  He recommended if she needed a biopsy doing a bronchoscopy biopsy but he felt that she was not improving on antibiotics and recommended medical treatment.  Patient is feeling better. Ambulatory Home O2 Screen done showed that she desaturated on Ambulation but was ok on Room Air. PT recommending no follow up. Still draining outptut from Chest Tube. Per PCCM if output is less thant 150-200 cc, CCM to remove Tube in the next few days. Dr. Delton Coombes to evaluate in the am and follow repeat scan and decide about outpatient vs. Inpatient bronchoscopy.   Output is decreasing so will defer to Pulmonary when to remove CT. Patient still has some pain at chest tube insertion site and does not want to take Ibuprofen so will add Robaxin. Pulmonary recommending changing to Pleurovac and continue suction to measure accurately. They are considering removal Chest Tube when output is <50 mL/24. They are also considering lytics/Dnase but unclear if indicated. Pulmonary recommending repeat CT chest in 1 month and consider pros/cons of VATS decortication and Pulmonary will discuss with Thoracic Surgery again to see if there is any benefit to consider early VATS.   Assessment & Plan:   Principal Problem:   Pleural effusion Active Problems:   Malnutrition of moderate degree   Empyema (HCC)  Acute Respiratory Failure with  hypoxia in the setting of Large Left Sided pleural effusion with concern for underlying malignancy but now the setting of strep intermedius empyema, present on admission -Admitted to the SDU  -Initially suspected malignant pleural effusion however the pleural fluid analysis showed no malignant cells and did show inflammation -Cultures are growing strep intermedius -Patient had a chest tube placement in  the ED however it failed because it failed to drain due to likely blockage and kinking -Will follow up on her pleural fluid cell count, pH, protein, LDH and cytology cultures; pleural fluid color was obtained milky with 16,000 total nucleated cell count and turbid appearance -Pulmonary was consulted for further assistance and evaluation -Because of fevers and leukocytosis of empirically added IV ceftriaxone and azithromycin; now on IV Unasyn -Pulmonary Dr. Katrinka Blazing reevaluated and pulled initial chest tube and replaced it and now it is draining appropriately  -WBC went from 29.3 -> 23.0 -> 21.7 -> 13.7 -> 8.0 -> 13.1 -> 9.5 -Cultures from it is growing Streptococcus intermedius which is pansensitive so she was de-escalated to IV Unasyn from IV ceftriaxone -SpO2: 97 % O2 Flow Rate (L/min): 2 L/min; now is off of supplemental oxygen via nasal cannula but desaturated on Home O2 Screen -Continuous pulse oximetry maintain O2 saturation greater 90% -Continue supplemental oxygen via nasal cannula and wean O2 as tolerated; pulmonary recommending continue chest tube and wean FiO2 for pulse ox greater than 88 --Repeat CT Scan showed "Left lateral approach thoracostomy tube with tip located in the major fissure, with a small moderate left pneumothorax. There is extensive nodular thickening of the parietal and visceral pleura throughout the left hemithorax with incomplete re-expansion of the left lung suggestive of the entrapment.  Similar appearance of the mass like area along the left anterior abdominal measuring 4 cm which is favored  to represent loculated fluid along the costophrenic angle as opposed to a discrete chest wall mass. Multifocal consolidations, nodularity and ground-glass opacities with septal thickening throughout the left lung. Prominent to mildly enlarged lower cervical, mediastinal, and left axillary lymph nodes are again visualized and appears stable. Trace upper abdominal, predominantly  perihepatic, free fluid.  Diffuse subcutaneous edema with left chest wall subcutaneous emphysema. Aortic atherosclerosis." -Repeat CXR showed "Left chest tube in stable position. Stable left-sided pneumothorax. Stable left-sided pleural thickening and or effusion. Stable atelectatic changes left lung. Stable elevation left hemidiaphragm." -Because of her empyema pulmonary recommending evaluating for head and neck origin especially all infections and they are getting an echocardiogram given the rare incidence of endocarditis but also the negative CT of the chest to assess if she needs intrapleural lytics but Pulmonary reviewed  Scan and will hold off intrapleural Lytics.  -ID consulted for further evaluation and Recc's; ID recommending a prolonged treatment with 4 weeks of antibiotics and continue on ampicillin sulbactam for the time being and finding an oral equivalent the closest time of discharge -ID recommended also obtaining a CT surgery consult I spoke with Dr. Cornelius Moras.  I spoke with Dr. Cornelius Moras extensively today and he felt that there is no role for CT surgery involvement in the patient's case and states that at some point she may need a biopsy but at this point she will not need anything as she is a pleural drain in place which is draining appropriately.  He recommended if she needed a biopsy doing a bronchoscopy biopsy but he felt that she was not improving on antibiotics and recommended medical treatment. Pulmonary to Rediscuss with CT Surgery  -Pain  control with Acetaminophen, Ibuprofen, and Oxycodone IR; Start Robaxin  -Pulmonary Managing Chest tube and recommending removing in the next day or two if output is less than 150-200 cc and may remove today and will defer to them  -Pulmonary recommending changing to Pleurovac and continue suction to measure accurately. They are considering removal Chest Tube when output is <50 mL/24. They are also considering lytics/Dnase but unclear if indicated. Pulmonary  recommending repeat CT chest in 1 month and consider pros/cons of VATS decortication and Pulmonary will discuss with Thoracic Surgery again to see if there is any benefit to consider early VATS.  -Need to repeat ambulatory home O2 screen prior to discharge given that she did not desaturate today  Hypotension -In the setting of the Precedex drip, now improved -Pulmonary is providing boluses for the patient -Continue to monitor blood pressures per protocol -Blood pressures have now stabilized and last blood pressure was 127/77  Normocytic Anemia -Anemia panel was checked and showed an iron level of 20, U IBC 163, TIBC 183 saturation ratios of 11%, ferritin level 912, folate level 12.0 and vitamin B12 of 926 -Patient's Hgb/Hct went from 10.5/30.6 -> 10.6/31.6 -> 9.4/28.4  -> 10.0/30.7 -> 9.8/30.3 -> 9.9/30.8 -> 9.1/28.2 -> 9.7/29.6 -> 8.4/25.7 -Continue to monitor for signs and symptoms of bleeding; currently no overt bleeding noted -Repeat CBC in a.m.  Abnormal LFTs -Likely reactive and now worsening  -AST went from 39 -> 50 -> 53 -> 92 -> 68 -ALT went from 34 -> 44 -> 48 -> 79 -> 67 -Continue to monitor and trend hepatic function panel and if continues to worsen will obtain a RUQ U/S and an Acute Hepatitis Panel; Will also check Acetaminophen Level and Salicylate Level  -Repeat CMP in the AM   Thrombocytosis -Likely Reactive from Above in setting of empyema -Patient's Platelet Count went from 635 -> 620 -> 571 -> 618 -> 627 -> 678 -> 540 -> 722 -> 522 -Continue to Monitor and Trend -Repeat CBC in the AM  Hyponatremia -Patient's Na+ has improved to 138 yesterday and is now 137 -Initially there was a concern for malignancy however now is in the setting of empyema -Continue to Monitor and Trend -Repeat CMP in the AM  Hypomagnesemia -Replete. Mag Level is now 1.9 -Continue to Monitor and Replete as Necessary -Repeat CMP in the AM   Hypokalemia -Improved and K+ is now 3.3 -Replete  with po Kcl 40 mEQ BID x2 -Continue to Monitor and Replete as Necessary  -Repeat CMP in the AM   Metabolic Acidosis -Mild and improved. Patient's CO2 is now 27, AG is 8, and Chloride Level is 102 -Continue to Monitor and Trend -Repeat CMP in the AM   Abnormal EKG -Was concerning for pericardial effusion -Echocardiogram and showed Normal EF of 60-65%  Left hilar mass and left pleural soft tissue mass with concern for malignancy however this is now a Streptococcus intermedius empyema -She had a pending pleural fluid studies which we will need to follow cytology for -Pulmonary recommending repeating chest CT once effusion is resolved for better characterization of chest possible masses -Per pulmonary if pleural fluid remains unremarkable/indeterminate may need a bronchoscopy for biopsy -Repeat CT Scan showed "Left lateral approach thoracostomy tube with tip located in the major fissure, with a small moderate left pneumothorax. There is extensive nodular thickening of the parietal and visceral pleura throughout the left hemithorax with incomplete re-expansion of the left lung suggestive of the entrapment.  Similar appearance of the  mass like area along the left anterior abdominal measuring 4 cm which is favored  to represent loculated fluid along the costophrenic angle as opposed to a discrete chest wall mass. Multifocal consolidations, nodularity and ground-glass opacities with septal thickening throughout the left lung. Prominent to mildly enlarged lower cervical, mediastinal, and left axillary lymph nodes are again visualized and appears stable. Trace upper abdominal, predominantly perihepatic, free fluid.  Diffuse subcutaneous edema with left chest wall subcutaneous emphysema. Aortic atherosclerosis." -ID consulted for further evaluation; they are recommending CT surgery to evaluate the patient to get up tissue biopsy to determine the cause of mass and further management and have them if she needs  a VATS for decortication of thick rind from infection -CT Surgery felt no role for VATS and felt they did not need to consult as her Chest Tube is draining well but Pulmonary to re-discuss  -See above. -Per Pulmonary once Chest Tube is out they are going to repeat CT Chest in 1 month for Lung RE-expansion   Abdominal Loculated Fluid Collection -As above -Obtained IR eval and CT guided Aspiration which sent 10 mL  -Gram stain from 07/03/20 showed Abundant WBC Present both PMN and Mononuclear with Moderate Gram Positive Cocci; Cx showing NGTD at 4 Days   Non-Severe (Moderate) Malnutrition in the context of Acute Illness/Injury  -Nutritionist consulted for further evaluation and recommendations -The nutritionist team is recommending boost breeze once a day as well as Molli Posey 1.4 p.o. once a day and a multivitamin with minerals daily  DVT prophylaxis: Enoxaparin 40 mg sq q24h Code Status: FULL CODE  Family Communication: No family present at bedside  Disposition Plan: Pending further clearance and evaluation by pulmonary; patient also need PT OT to further evaluate and treat and recommending no Follow up  Status is: Inpatient  Remains inpatient appropriate because:Ongoing diagnostic testing needed not appropriate for outpatient work up, Unsafe d/c plan, IV treatments appropriate due to intensity of illness or inability to take PO, and Inpatient level of care appropriate due to severity of illness  Dispo: The patient is from: Home              Anticipated d/c is to: Home              Patient currently is not medically stable to d/c.   Difficult to place patient No  Consultants:  Pulmonary IR ID Discussed with Dr. Cornelius Moras  Procedures: Chest Tube Insertion  ECHOCARDIOGRAM IMPRESSIONS     1. Left ventricular ejection fraction, by estimation, is 60 to 65%. The  left ventricle has normal function. The left ventricle has no regional  wall motion abnormalities. Left ventricular diastolic  function could not  be evaluated.   2. Right ventricular systolic function is normal. The right ventricular  size is normal.   3. The mitral valve is normal in structure. No evidence of mitral valve  regurgitation. No evidence of mitral stenosis.   4. The aortic valve is grossly normal. Aortic valve regurgitation is not  visualized. No aortic stenosis is present.   FINDINGS   Left Ventricle: Left ventricular ejection fraction, by estimation, is 60  to 65%. The left ventricle has normal function. The left ventricle has no  regional wall motion abnormalities. The left ventricular internal cavity  size was normal in size. There is   no left ventricular hypertrophy. Left ventricular diastolic function  could not be evaluated.   Right Ventricle: The right ventricular size is normal. No increase in  right ventricular wall thickness. Right ventricular systolic function is  normal.   Left Atrium: Left atrial size was normal in size.   Right Atrium: Right atrial size was normal in size.   Pericardium: There is no evidence of pericardial effusion.   Mitral Valve: The mitral valve is normal in structure. No evidence of  mitral valve regurgitation. No evidence of mitral valve stenosis.   Tricuspid Valve: The tricuspid valve is normal in structure. Tricuspid  valve regurgitation is trivial.   Aortic Valve: The aortic valve is grossly normal. Aortic valve  regurgitation is not visualized. No aortic stenosis is present.   Pulmonic Valve: The pulmonic valve was not well visualized. Pulmonic valve  regurgitation is not visualized.   Aorta: The aortic root and ascending aorta are structurally normal, with  no evidence of dilitation.   IAS/Shunts: The atrial septum is grossly normal.      LEFT VENTRICLE  PLAX 2D  LVIDd:         3.70 cm  LVIDs:         2.60 cm  LV PW:         1.00 cm  LV IVS:        0.90 cm  LVOT diam:     2.20 cm  LVOT Area:     3.80 cm      IVC  IVC diam: 1.70  cm   LEFT ATRIUM         Index  LA diam:    2.80 cm 1.77 cm/m      AORTA  Ao Root diam: 3.10 cm  Ao Asc diam:  3.30 cm      SHUNTS  Systemic Diam: 2.20 cm   Antimicrobials:  Anti-infectives (From admission, onward)    Start     Dose/Rate Route Frequency Ordered Stop   07/03/20 0600  Ampicillin-Sulbactam (UNASYN) 3 g in sodium chloride 0.9 % 100 mL IVPB        3 g 200 mL/hr over 30 Minutes Intravenous Every 6 hours 07/02/20 1409     07/01/20 1045  cefTRIAXone (ROCEPHIN) 2 g in sodium chloride 0.9 % 100 mL IVPB  Status:  Discontinued        2 g 200 mL/hr over 30 Minutes Intravenous Every 24 hours 07/01/20 0947 07/02/20 1409   07/01/20 1000  cefTRIAXone (ROCEPHIN) 1 g in sodium chloride 0.9 % 100 mL IVPB  Status:  Discontinued        1 g 200 mL/hr over 30 Minutes Intravenous Every 24 hours 06/30/20 1723 07/01/20 0947   07/01/20 1000  azithromycin (ZITHROMAX) 500 mg in sodium chloride 0.9 % 250 mL IVPB  Status:  Discontinued        500 mg 250 mL/hr over 60 Minutes Intravenous Every 24 hours 06/30/20 1723 07/02/20 1409   06/30/20 0700  vancomycin (VANCOCIN) IVPB 1000 mg/200 mL premix        1,000 mg 200 mL/hr over 60 Minutes Intravenous  Once 06/30/20 0655 06/30/20 1134   06/30/20 0700  cefTRIAXone (ROCEPHIN) 2 g in sodium chloride 0.9 % 100 mL IVPB        2 g 200 mL/hr over 30 Minutes Intravenous  Once 06/30/20 0655 06/30/20 1017   06/30/20 0700  azithromycin (ZITHROMAX) 500 mg in sodium chloride 0.9 % 250 mL IVPB        500 mg 250 mL/hr over 60 Minutes Intravenous  Once 06/30/20 0655 06/30/20 1030        Subjective: Seen  and examined at bedside and still having some discomfort at chest tube site. No lightheadedness or dizziness. Feet are still slightly swollen. Denies any SOB. No other concerns or complaints at this time.   Objective: Vitals:   07/06/20 1410 07/06/20 2153 07/07/20 0526 07/07/20 1418  BP: 118/70 124/70 126/87 127/77  Pulse: 98  84 (!) 102  Resp:  20 20  20   Temp: 98.4 F (36.9 C) 97.8 F (36.6 C) 98.2 F (36.8 C) 98.4 F (36.9 C)  TempSrc: Oral Oral Oral Oral  SpO2: 95% 95% 97% 97%  Weight:      Height:        Intake/Output Summary (Last 24 hours) at 07/07/2020 1524 Last data filed at 07/07/2020 1353 Gross per 24 hour  Intake 100 ml  Output 100 ml  Net 0 ml    Filed Weights   06/30/20 0625 07/02/20 1812  Weight: 55.3 kg 58 kg   Examination: Physical Exam:  Constitutional: Thin Cachectic Caucasian female in NAD appears calm sitting in the chair bedside Eyes: Lids and conjunctivae normal, sclerae anicteric  ENMT: External Ears, Nose appear normal. Grossly normal hearing.  Neck: Appears normal, supple, no cervical masses, normal ROM, no appreciable thyromegaly; no JVD Respiratory: Diminished  to auscultation bilaterally with coarse breath sounds, no wheezing, rales, rhonchi or crackles. Normal respiratory effort and patient is not tachypenic. No accessory muscle use. Unlabored breathing and not wearing Supplemental O2 via [place]. Has a Left sided Chest Tube in place.  Cardiovascular: RRR, no murmurs / rubs / gallops. S1 and S2 auscultated. 1+ LE Pedal Edema  Abdomen: Soft, non-tender, non-distended. Bowel sounds positive.  GU: Deferred. Musculoskeletal: No clubbing / cyanosis of digits/nails. No joint deformity upper and lower extremities.  Skin: No rashes, lesions, ulcers on a limited skin evaluation. No induration; Warm and dry.  Neurologic: CN 2-12 grossly intact with no focal deficits. Romberg sign and cerebellar reflexes not assessed.  Psychiatric: Normal judgment and insight. Alert and oriented x 3. Normal mood and appropriate affect.   Data Reviewed: I have personally reviewed following labs and imaging studies  CBC: Recent Labs  Lab 07/03/20 0457 07/04/20 0550 07/05/20 0554 07/06/20 0953 07/07/20 0505  WBC 13.7* 13.3* 8.0 13.1* 9.5  NEUTROABS 10.2* 10.1* 4.9 10.2* 6.6  HGB 9.8* 9.9* 9.1* 9.7* 8.4*  HCT 30.3* 30.8*  28.2* 29.6* 25.7*  MCV 95.3 96.6 95.6 94.9 95.2  PLT 627* 678* 540* 722* 511*    Basic Metabolic Panel: Recent Labs  Lab 07/03/20 0457 07/04/20 0550 07/05/20 0554 07/06/20 0953 07/07/20 0505  NA 135 134* 138 137 137  K 4.0 3.0* 4.0 3.8 3.3*  CL 103 98 105 100 102  CO2 24 25 27 29 27   GLUCOSE 96 92 99 98 92  BUN 13 10 11 8  6*  CREATININE 0.68 0.66 0.50 0.62 0.58  CALCIUM 9.0 8.1* 8.4* 9.0 8.6*  MG 1.9 1.8 1.8 2.0 1.9  PHOS 3.6 2.9 3.7 4.1 4.3    GFR: Estimated Creatinine Clearance: 56.9 mL/min (by C-G formula based on SCr of 0.58 mg/dL). Liver Function Tests: Recent Labs  Lab 07/03/20 0457 07/04/20 0550 07/05/20 0554 07/06/20 0953 07/07/20 0505  AST 39 50* 53* 92* 68*  ALT 34 44 48* 79* 67*  ALKPHOS 119 111 92 114 93  BILITOT 0.6 0.6 0.6 0.5 0.3  PROT 5.4* 5.4* 5.2* 6.2* 5.4*  ALBUMIN 1.7* 1.9* 1.9* 2.3* 2.0*    No results for input(s): LIPASE, AMYLASE in the last 168 hours.  No results for input(s): AMMONIA in the last 168 hours. Coagulation Profile: No results for input(s): INR, PROTIME in the last 168 hours. Cardiac Enzymes: No results for input(s): CKTOTAL, CKMB, CKMBINDEX, TROPONINI in the last 168 hours. BNP (last 3 results) No results for input(s): PROBNP in the last 8760 hours. HbA1C: No results for input(s): HGBA1C in the last 72 hours. CBG: Recent Labs  Lab 07/06/20 1139 07/06/20 1634 07/06/20 2150 07/07/20 0729 07/07/20 1222  GLUCAP 84 96 167* 92 102*    Lipid Profile: No results for input(s): CHOL, HDL, LDLCALC, TRIG, CHOLHDL, LDLDIRECT in the last 72 hours. Thyroid Function Tests: No results for input(s): TSH, T4TOTAL, FREET4, T3FREE, THYROIDAB in the last 72 hours. Anemia Panel: No results for input(s): VITAMINB12, FOLATE, FERRITIN, TIBC, IRON, RETICCTPCT in the last 72 hours.  Sepsis Labs: No results for input(s): PROCALCITON, LATICACIDVEN in the last 168 hours.   Recent Results (from the past 240 hour(s))  Resp Panel by RT-PCR  (Flu A&B, Covid) Nasopharyngeal Swab     Status: None   Collection Time: 06/30/20  6:42 AM   Specimen: Nasopharyngeal Swab; Nasopharyngeal(NP) swabs in vial transport medium  Result Value Ref Range Status   SARS Coronavirus 2 by RT PCR NEGATIVE NEGATIVE Final    Comment: (NOTE) SARS-CoV-2 target nucleic acids are NOT DETECTED.  The SARS-CoV-2 RNA is generally detectable in upper respiratory specimens during the acute phase of infection. The lowest concentration of SARS-CoV-2 viral copies this assay can detect is 138 copies/mL. A negative result does not preclude SARS-Cov-2 infection and should not be used as the sole basis for treatment or other patient management decisions. A negative result may occur with  improper specimen collection/handling, submission of specimen other than nasopharyngeal swab, presence of viral mutation(s) within the areas targeted by this assay, and inadequate number of viral copies(<138 copies/mL). A negative result must be combined with clinical observations, patient history, and epidemiological information. The expected result is Negative.  Fact Sheet for Patients:  BloggerCourse.com  Fact Sheet for Healthcare Providers:  SeriousBroker.it  This test is no t yet approved or cleared by the [place] FDA and  has been authorized for detection and/or diagnosis of SARS-CoV-2 by FDA under an Emergency Use Authorization (EUA). This EUA will remain  in effect (meaning this test can be used) for the duration of the COVID-19 declaration under Section 564(b)(1) of the Act, 21 U.S.C.section 360bbb-3(b)(1), unless the authorization is terminated  or revoked sooner.       Influenza A by PCR NEGATIVE NEGATIVE Final   Influenza B by PCR NEGATIVE NEGATIVE Final    Comment: (NOTE) The Xpert Xpress SARS-CoV-2/FLU/RSV plus assay is intended as an aid in the diagnosis of influenza from Nasopharyngeal swab specimens  and should not be used as a sole basis for treatment. Nasal washings and aspirates are unacceptable for Xpert Xpress SARS-CoV-2/FLU/RSV testing.  Fact Sheet for Patients: BloggerCourse.com  Fact Sheet for Healthcare Providers: SeriousBroker.it  This test is not yet approved or cleared by the [place] FDA and has been authorized for detection and/or diagnosis of SARS-CoV-2 by FDA under an Emergency Use Authorization (EUA). This EUA will remain in effect (meaning this test can be used) for the duration of the COVID-19 declaration under Section 564(b)(1) of the Act, 21 U.S.C. section 360bbb-3(b)(1), unless the authorization is terminated or revoked.  Performed at [facility], [address], [place], [place] [address]   Blood culture (routine x 2)     Status: None  Collection Time: 06/30/20  7:37 AM   Specimen: Left Antecubital; Blood  Result Value Ref Range Status   Specimen Description   Final    LEFT ANTECUBITAL Performed at [facility], [address] [place], [place] [address]    Special Requests   Final    Blood Culture adequate volume Performed at [facility], [address], [place], [place] [address]    Culture   Final    NO GROWTH 5 DAYS Performed at [facility], [address] [place], [place] [address]    Report Status 07/05/2020 FINAL  Final  Blood culture (routine x 2)     Status: None   Collection Time: 06/30/20  7:42 AM   Specimen: Right Antecubital; Blood  Result Value Ref Range Status   Specimen Description   Final    RIGHT ANTECUBITAL Performed at [facility], [address] [place], [place] [address]    Special Requests   Final    Blood Culture adequate volume Performed at [facility], [address] [place], [place] [address]    Culture   Final    NO GROWTH 5 DAYS Performed at  [facility], [address] [place], [place] [address]    Report Status 07/05/2020 FINAL  Final  Body fluid culture w Gram Stain     Status: None   Collection Time: 06/30/20 10:01 AM   Specimen: Pleural Fluid  Result Value Ref Range Status   Specimen Description   Final    PLEURAL Performed at [facility], [address] [place], [place] [address]    Special Requests   Final    NONE Performed at [facility], [address] [place], [place] [address]    Gram Stain   Final    ABUNDANT WBC PRESENT,BOTH PMN AND MONONUCLEAR ABUNDANT GRAM POSITIVE COCCI Performed at [facility], [address] [place], [place] [address]    Culture ABUNDANT STREPTOCOCCUS INTERMEDIUS  Final   Report Status 07/02/2020 FINAL  Final   Organism ID, Bacteria STREPTOCOCCUS INTERMEDIUS  Final      Susceptibility   Streptococcus intermedius - MIC*    PENICILLIN <=0.06 SENSITIVE Sensitive     CEFTRIAXONE 0.25 SENSITIVE Sensitive     ERYTHROMYCIN <=0.12 SENSITIVE Sensitive     LEVOFLOXACIN 0.5 SENSITIVE Sensitive     VANCOMYCIN 0.5 SENSITIVE Sensitive     * ABUNDANT STREPTOCOCCUS INTERMEDIUS  MRSA PCR Screening     Status: None   Collection Time: 06/30/20  4:17 PM   Specimen: Nasopharyngeal  Result Value Ref Range Status   MRSA by PCR NEGATIVE NEGATIVE Final    Comment:        The GeneXpert MRSA Assay (FDA approved for NASAL specimens only), is one component of a comprehensive MRSA colonization surveillance program. It is not intended to diagnose MRSA infection nor to guide or monitor treatment for MRSA infections. Performed at [facility], [address] [place], [place] [address]   Body fluid culture w Gram Stain     Status: None   Collection Time: 07/01/20 12:30 PM   Specimen: Pleura; Body Fluid  Result Value Ref Range Status   Specimen Description   Final    PLEURAL LT Performed at [facility],  [address], [place], [place] [address]    Special Requests   Final    NONE Performed at [facility], [address]  [place], [place] [address]    Gram Stain   Final    ABUNDANT WBC PRESENT,BOTH PMN AND MONONUCLEAR ABUNDANT GRAM POSITIVE COCCI    Culture   Final    ABUNDANT STREPTOCOCCUS INTERMEDIUS SUSCEPTIBILITIES PERFORMED ON PREVIOUS CULTURE WITHIN THE LAST 5 DAYS. Performed at [facility], [address] [place], [place] [address]    Report Status 07/03/2020 FINAL  Final  Aerobic/Anaerobic Culture w Gram Stain (surgical/deep wound)     Status: None (Preliminary result)   Collection Time: 07/03/20  4:14 PM   Specimen: Abscess  Result Value Ref Range Status   Specimen Description   Final    ABSCESS Performed at [facility], [address], [place], [place] [address]    Special Requests   Final    Normal Performed at [facility], [address], [place], [place] [address]    Gram Stain   Final    ABUNDANT WBC PRESENT,BOTH PMN AND MONONUCLEAR MODERATE GRAM POSITIVE COCCI    Culture   Final    NO GROWTH 4 DAYS NO ANAEROBES ISOLATED; CULTURE IN PROGRESS FOR 5 DAYS Performed at [facility], [address] [place], [place] [address]    Report Status PENDING  Incomplete     RN Pressure Injury Documentation:     Estimated body mass index is 23.39 kg/m as calculated from the following:   Height as of this encounter:  (1.575 m).   Weight as of this encounter: 58 kg.  Malnutrition Type:  Nutrition Problem: Moderate Malnutrition Etiology: acute illness   Malnutrition Characteristics:  Signs/Symptoms: mild fat depletion, mild muscle depletion   Nutrition Interventions:  Interventions: Boost Breeze, MVI, Other (Comment) [employer] 1.4)   Radiology Studies: DG CHEST PORT 1 VIEW  Result Date: 07/07/2020 CLINICAL DATA:  Shortness of breath.  Chest tube. EXAM: PORTABLE CHEST 1 VIEW  COMPARISON:  07/05/2020. FINDINGS: Left chest tube in stable position. Left pneumothorax unchanged. Stable left-sided pleural thickening and or effusion. Atelectatic changes left lung. Stable elevation left hemidiaphragm. Right lung is clear. Heart size stable. Thoracic spine scoliosis. IMPRESSION: Left chest tube in stable position. Stable left-sided pneumothorax. Stable left-sided pleural thickening and or effusion. Stable atelectatic changes left lung. Stable elevation left hemidiaphragm. Electronically Signed   By: Maisie Fus  Register   On: 07/07/2020 09:02   US Abdomen Limited RUQ (LIVER/GB)  Result Date: 07/06/2020 CLINICAL DATA:  Abnormal LFTs EXAM: ULTRASOUND ABDOMEN LIMITED RIGHT UPPER QUADRANT COMPARISON:  None. FINDINGS: Gallbladder: No gallstones or wall thickening visualized. No sonographic Murphy sign noted by sonographer. Common bile duct: Diameter: 5.6 mm Liver: No focal lesion identified. Within normal limits in parenchymal echogenicity. Portal vein is patent on color Doppler imaging with normal direction of blood flow towards the liver. Other: None. IMPRESSION: Unremarkable right upper quadrant ultrasound. Electronically Signed   By: Alcide Clever M.D.   On: 07/06/2020 20:58    Scheduled Meds:  chlorhexidine  15 mL Mouth Rinse BID   Chlorhexidine Gluconate Cloth  6 each Topical Daily   docusate  100 mg Per Tube BID   enoxaparin (LOVENOX) injection  40 mg Subcutaneous Q24H   feeding supplement  1 Container Oral Q24H   feeding supplement (KATE FARMS STANDARD 1.4)  325 mL Oral Daily   loratadine  10 mg Oral QHS   mouth rinse  15 mL Mouth Rinse q12n4p   multivitamin with minerals  1 tablet Oral Daily   polyethylene glycol  17 g Per  Tube Daily   vitamin C  250 mg Oral Daily   Continuous Infusions:  sodium chloride     ampicillin-sulbactam (UNASYN) IV 3 g (07/07/20 1151)    LOS: 7 days   Merlene Laughter, DO [facility] PAGER is on AMION  If 7PM-7AM, please contact  night-coverage [email or web address]

## 2020-07-08 ENCOUNTER — Inpatient Hospital Stay (HOSPITAL_COMMUNITY): Payer: BC Managed Care – PPO

## 2020-07-08 ENCOUNTER — Encounter (HOSPITAL_COMMUNITY): Payer: Self-pay | Admitting: Internal Medicine

## 2020-07-08 DIAGNOSIS — R7401 Elevation of levels of liver transaminase levels: Secondary | ICD-10-CM

## 2020-07-08 DIAGNOSIS — R945 Abnormal results of liver function studies: Secondary | ICD-10-CM

## 2020-07-08 DIAGNOSIS — R918 Other nonspecific abnormal finding of lung field: Secondary | ICD-10-CM

## 2020-07-08 HISTORY — DX: Other nonspecific abnormal finding of lung field: R91.8

## 2020-07-08 LAB — GLUCOSE, CAPILLARY: Glucose-Capillary: 117 mg/dL — ABNORMAL HIGH (ref 70–99)

## 2020-07-08 LAB — AEROBIC/ANAEROBIC CULTURE W GRAM STAIN (SURGICAL/DEEP WOUND)
Culture: NO GROWTH
Special Requests: NORMAL

## 2020-07-08 MED ORDER — SODIUM CHLORIDE (PF) 0.9 % IJ SOLN
INTRAMUSCULAR | Status: AC
  Filled 2020-07-08: qty 50

## 2020-07-08 MED ORDER — IOHEXOL 300 MG/ML  SOLN
75.0000 mL | Freq: Once | INTRAMUSCULAR | Status: AC | PRN
  Administered 2020-07-08: 75 mL via INTRAVENOUS

## 2020-07-08 NOTE — Progress Notes (Signed)
PROGRESS NOTE  Jamie Carr WNU:[id number] DOB: [birth year] DOA: 06/30/2020 PCP: Henreitta Leber, PA-C  Brief History   65 year old woman presented with shortness of breath and left-sided chest pain, found to have complete opacification of left hemithorax showing left-sided pleural effusion on CT.  Chest tube placed 6/14, 6/15.  Ultimately culture grew Streptococcus intermedius.  Seen by infectious disease.  Weaned off oxygen.  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  Left hilar mass, left pleural mass, mediastinal adenopathy and cervical lymphadenopathy. --Per chart review cardiothoracic surgery felt no role for VATS currently.  Lymphadenopathy thought secondary to reactive infection.  Normocytic anemia likely secondary to acute illness --Follow clinically  Elevated transaminases --Modest in nature, less than twice normal.       Disposition Plan:  Discussion:   Status is: Inpatient  Remains inpatient appropriate because:IV treatments appropriate due to intensity of illness or inability to take PO and Inpatient level of care appropriate due to severity of illness  Dispo: The patient is from: Home              Anticipated d/c is to: Home              Patient currently is not medically stable to d/c.   Difficult to place patient No  DVT prophylaxis: enoxaparin (LOVENOX) injection 40 mg Start: 06/30/20 2200   Code Status: Full Code Level of care: Telemetry Family Communication: none  Brendia Sacks, MD  [facility] Direct contact: see [email or web address] (further directions at bottom of note if needed) 7PM-7AM contact night coverage as at bottom of note 07/08/2020, 4:11 PM  LOS: 8 days    Consults:  Pulmonology   Interval  History/Subjective  CC: f/u SOB  Feels much better, breathing better  Review of Systems  Respiratory:  Negative for shortness of breath.   Gastrointestinal:  Negative for nausea and vomiting.   Objective   Vitals:  Vitals:   07/08/20 0605 07/08/20 1348  BP: 131/75 109/73  Pulse: 95 92  Resp: 18 16  Temp: 98.3 F (36.8 C) 98.2 F (36.8 C)  SpO2: 96% 97%    Exam: Physical Exam Vitals and nursing note reviewed.  Constitutional:      General: She is not in acute distress.    Appearance: She is not toxic-appearing.  Cardiovascular:     Rate and Rhythm: Normal rate and regular rhythm.  Pulmonary:     Effort: Pulmonary effort is normal. No tachypnea.     Breath sounds: No wheezing, rhonchi or rales.  Neurological:     Mental Status: She is alert.  Psychiatric:        Mood and Affect: Mood normal.        Behavior: Behavior normal.    I have personally reviewed the labs and other data, making special note of:   Today's Data  BMP and CBC noted from 6/21   Scheduled Meds:  chlorhexidine  15 mL Mouth Rinse BID   Chlorhexidine Gluconate Cloth  6 each Topical Daily   docusate  100 mg Per Tube BID   enoxaparin (LOVENOX) injection  40 mg Subcutaneous Q24H   feeding supplement  1 Container Oral Q24H   feeding supplement (KATE FARMS STANDARD 1.4)  325 mL Oral Daily   loratadine  10 mg Oral QHS   mouth rinse  15 mL  Mouth Rinse q12n4p   multivitamin with minerals  1 tablet Oral Daily   polyethylene glycol  17 g Per Tube Daily   sodium chloride (PF)       vitamin C  250 mg Oral Daily   Continuous Infusions:  sodium chloride     ampicillin-sulbactam (UNASYN) IV 3 g (07/08/20 1138)    Principal Problem:   Empyema (HCC) Active Problems:   Pleural effusion   Malnutrition of moderate degree   Hilar mass   Elevated transaminase level   LOS: 8 days   How to contact the [facility] Attending or Consulting provider 7A - 7P or covering provider during after hours 7P -7A, for this  patient?  Check the care team in [facility] and look for a) attending/consulting TRH provider listed and b) the [facility] team listed Log into [email or web address] and use [place]'s universal password to access. If you do not have the password, please contact the hospital operator. Locate the [facility] provider you are looking for under [facility] and page to a number that you can be directly reached. If you still have difficulty reaching the provider, please page the [facility] (Director on Call) for the Hospitalists listed on amion for assistance.

## 2020-07-08 NOTE — Progress Notes (Signed)
NAME:  Jamie Carr MRN:  [id number] DOB:  1955/05/13 LOS: 8 ADMISSION DATE:  06/30/2020 CONSULTATION DATE:  06/30/2020 REFERRING MD: Dr. Wilkie Aye - EDP CHIEF COMPLAINT:  SOB, Pleural effusion   History of Present Illness:  65 year old female with PMHx significant for epilepsy, thyroid disease and tremors who presented to [facility] with complaints of dyspnea and left-sided chest pain x 2-3 weeks (worse with deep breathing and coughing, radiating to L shoulder). Dyspnea was progressive prompting her to seek medical attention 6/14.   In the ED, patient was tachypneic but no hypoxia was observed. CXR demonstrated complete opacification of the left hemithorax suggestive of large pleural effusion. CT Chest obtained to further characterize effusion showed large left-sided pleural effusion as well as concern for tumor at the left hilum and a pleural based mass.   PCCM asked to consult for effusion management.   Pertinent Medical History:  Epilepsy, thyroid disease, tremor, myopia, presbyopia  Significant Hospital Events:  6/14 - Admit to [facility], transfer to [facility], [facility] consult for management of effusion, CT placed by EDP 6/15 - Increased pain at CT insertion site, CXR demonstrating possible kink at insertion site/L chest wall. Large-bore CT placed at bedside (Dr. Katrinka Blazing). Episode of respiratory distress with hypotension post-CT placement, thought to be med effect versus reexpansion pulm edema. Improved with Fentanyl/Precedex, weaned off of Precedex. BPs improved with fluid resuscitation.  Culture ultimately grew Streptococcus intermedius [LDH greater than 10,000].  No malignant cells 6/16 - Improved CT drainage overnight, stable O2 needs. CXR with marginally increased PTX, decreased effusion, asymmetrical pulmonary edema on L. Transferred to floor.  Was able to come off oxygen 6/17 - Improved respiratory status and O2 needs. CT Chest demonstrating 3.5 x 4cm loculated collection along with pneumothorax  ex vacuo vs. Mass in the abdominal wall ->  IR to attempt aspiration today -> no growth as of 6/19.  Noticed to be on 2 L nasal cannula which she says was Comfort.  Infectious disease consultation recommending 4 weeks of antibiotics and continue Unasyn for now. 07/04/2020: [facility] conversation with thoracic surgery: No role for VATS.  Recommend bronchoscopy consideration for mediastinal nodes. 6/20 No acute events overnight and patient is now on RA and feels well. Large bore CT remains in place and drained in the last 24hrs. PTX slight worse on CXR 6/21 no recorded chest tube output overnight  Repeat chest CT penging  Interim History / Subjective:  No acute events overnight  Reports some chest tube related chest pain overnight that was improved with NSAID  Objective:  Blood pressure 131/75, pulse 95, temperature 98.3 F (36.8 C), resp. rate 18, height 5\' 2"  (1.575 m), weight 58 kg, SpO2 96 %.        Intake/Output Summary (Last 24 hours) at 07/08/2020 0740 Last data filed at 07/07/2020 1353 Gross per 24 hour  Intake 100 ml  Output 100 ml  Net 0 ml    Filed Weights   06/30/20 0625 07/02/20 1812  Weight: 55.3 kg 58 kg    Physical Exam General: Pleasant middle-aged female sitting up in bedside recliner no acute distress HEENT: [place]/AT, MM pink/moist, PERRL,  Neuro: Alert and oriented x3, nonfocal CV: s1s2 regular rate and rhythm, no murmur, rubs, or gallops,  PULM: Clear to auscultation bilaterally, no increased work of breathing, no added breath sounds GI: soft, bowel sounds active in all 4 quadrants, non-tender, non-distended, tolerating oral diet Extremities: warm/dry, no edema  Skin: no rashes or lesions  Labs/imaging that I have personally reviewed:  CXR 6/16 Chest tube on left, unchanged, with left-sided pneumothorax, marginally larger. Airspace opacity consistent with pneumonia left base. Asymmetric pulmonary edema on the left is stable. CXR 6/17 Empyema with  stable left chest tube. Poor re-expansion of the left lung. Small volume left pneumothorax/pleural gas superimposed on diffuse pleural thickening. CT Chest 6/17 Left lateral approach thoracostomy tube with tip located in the major fissure, with a small moderate left pneumothorax. There is extensive nodular thickening of the parietal and visceral pleura throughout the left hemithorax with incomplete re-expansion of the left lung suggestive of the entrapment. Mass like area along the left anterior abdominal measuring 4 cm which is favored to represent loculated fluid along the costophrenic angle as opposed to a discrete chest wall mass. Multifocal consolidations, nodularity and ground-glass opacities with septal thickening throughout the left lung.  Lymphadenopathy Pleural Fluid Cx 6/16 - abundant WBC, abundant gram positive cocci -> strep intermedius Pleural Fluid LDH 6/16 - >10,000 Pleural Fluid Glucose 6/16 - 43 Pleural Fluid Cytology 6/15 - No malignant cells identified, acute inflammation 6/22 Chest CT pending  Resolved Hospital Problem List:   Acute hypoxic respiratory failure  Assessment & Plan:  Streptococcus intermedius empyema, POA -Pansensitive  Large left sided pleural effusion, POA -status post large bore left-sided chest tube Left hilar mass Left pleural mass Hilar mediastinal adenopathy and cervical lymphadenopathy -Patient has been evaluated by cardiothoracic surgery and no role of VATS identified currently.  Lymphadenopathy is likely secondary to reactive to infection P: Continue routine chest tube care Chest tube to remain to suction Frequent use of IS and flutter valve Continue to mobilize as able Remains on IV antibiotics Follow up on CT chest   Best Practice:  Per primary  SIGNATURE  Wonda Goodgame D. Tiburcio Pea, NP-C [facility] Personal contact information can be found on Amion  07/08/2020, 7:40 AM

## 2020-07-08 NOTE — Progress Notes (Signed)
[facility] Infectious Diseases Follow Up Note  Patient Identification: Patient Name: Jamie Carr MRN: [id number] Admit Date: 06/30/2020  6:14 AM Age: 65 y.o.Today's Date: 07/08/2020   Reason for Visit: Empyema   Principal Problem:   Empyema [facility]) Active Problems:   Pleural effusion   Malnutrition of moderate degree   Antibiotics: Unasyn 6/16-current   Lines/Tubes: Left-sided chest tube, external urethral catheter, PIV's  Interval Events: Continues to be afebrile, no leukocytosis   Assessment Left lung empyema secondary to Streptococcus intermedius Blood cultures no growth.  TTE negative for vegetations  Left chest/abdomen wall  localized fluid collection, status post CT-guided aspiration by IR.  Cultures GPC in gram stain Left pneumothorax Transaminitis-improving  Recommendations Continue Unasyn as is Will follow surgical/PCCMplans Will follow cultures Fu CT chest  Chest tube management per CT surgery Defer need of bronchoscopy/biopsy to rule out malignancy to pulmonary Monitor CBC and BMP on IV antibiotics  Rest of the management as per the primary team. Thank you for the consult. Please page with pertinent questions or concerns.  ______________________________________________________________________ Subjective patient seen and examined at the bedside.  Any fever chills and sweats.  Denies any chest pain cough or shortness of breath.  Denies any nausea vomiting or diarrhea   Vitals BP 131/75 (BP Location: Right Arm)   Pulse 95   Temp 98.3 F (36.8 C)   Resp 18   Ht 5\' 2"  (1.575 m)   Wt 58 kg   SpO2 96%   BMI 23.39 kg/m     Physical Exam Constitutional: Not in acute distress,     Comments:  Cardiovascular:     Rate and Rhythm: Normal rate and regular rhythm.     Heart sounds:   Pulmonary:     Effort: Pulmonary effort is normal.     Comments: Left-sided chest tube  Abdominal:     Palpations:  Abdomen is soft.     Tenderness: Nontender and nondistended  Musculoskeletal:        General: No swelling or tenderness.   Skin:    Comments: No lesions or rashes  Neurological:     General: No focal deficit present.   Psychiatric:        Mood and Affect: Mood normal.   Pertinent Microbiology Results for orders placed or performed during the hospital encounter of 06/30/20  Resp Panel by RT-PCR (Flu A&B, Covid) Nasopharyngeal Swab     Status: None   Collection Time: 06/30/20  6:42 AM   Specimen: Nasopharyngeal Swab; Nasopharyngeal(NP) swabs in vial transport medium  Result Value Ref Range Status   SARS Coronavirus 2 by RT PCR NEGATIVE NEGATIVE Final    Comment: (NOTE) SARS-CoV-2 target nucleic acids are NOT DETECTED.  The SARS-CoV-2 RNA is generally detectable in upper respiratory specimens during the acute phase of infection. The lowest concentration of SARS-CoV-2 viral copies this assay can detect is 138 copies/mL. A negative result does not preclude SARS-Cov-2 infection and should not be used as the sole basis for treatment or other patient management decisions. A negative result may occur with  improper specimen collection/handling, submission of specimen other than nasopharyngeal swab, presence of viral mutation(s) within the areas targeted by this assay, and inadequate number of viral copies(<138 copies/mL). A negative result must be combined with clinical observations, patient history, and epidemiological information. The expected result is Negative.  Fact Sheet for Patients:  07/02/20  Fact Sheet for Healthcare Providers:  BloggerCourse.com  This test is no t yet approved or cleared by the  [place] [employer] and  has been authorized for detection and/or diagnosis of SARS-CoV-2 by FDA under an TEFL teacher (EUA). This EUA will remain  in effect (meaning this test can be used) for the duration  of the COVID-19 declaration under Section 564(b)(1) of the Act, 21 U.S.C.section 360bbb-3(b)(1), unless the authorization is terminated  or revoked sooner.       Influenza A by PCR NEGATIVE NEGATIVE Final   Influenza B by PCR NEGATIVE NEGATIVE Final    Comment: (NOTE) The Xpert Xpress SARS-CoV-2/FLU/RSV plus assay is intended as an aid in the diagnosis of influenza from Nasopharyngeal swab specimens and should not be used as a sole basis for treatment. Nasal washings and aspirates are unacceptable for Xpert Xpress SARS-CoV-2/FLU/RSV testing.  Fact Sheet for Patients: BloggerCourse.com  Fact Sheet for Healthcare Providers: SeriousBroker.it  This test is not yet approved or cleared by the [place] FDA and has been authorized for detection and/or diagnosis of SARS-CoV-2 by FDA under an Emergency Use Authorization (EUA). This EUA will remain in effect (meaning this test can be used) for the duration of the COVID-19 declaration under Section 564(b)(1) of the Act, 21 U.S.C. section 360bbb-3(b)(1), unless the authorization is terminated or revoked.  Performed at [facility], [address], [place], [place] [address]   Blood culture (routine x 2)     Status: None   Collection Time: 06/30/20  7:37 AM   Specimen: Left Antecubital; Blood  Result Value Ref Range Status   Specimen Description   Final    LEFT ANTECUBITAL Performed at [facility], [address], [place], [place] [address]    Special Requests   Final    Blood Culture adequate volume Performed at [facility], [address] [place], [place] [address]    Culture   Final    NO GROWTH 5 DAYS Performed at [facility], [address] [place], [place] [address]    Report Status 07/05/2020 FINAL  Final  Blood culture (routine x 2)     Status: None   Collection Time: 06/30/20  7:42 AM   Specimen:  Right Antecubital; Blood  Result Value Ref Range Status   Specimen Description   Final    RIGHT ANTECUBITAL Performed at [facility], [address], [place], [place] [address]    Special Requests   Final    Blood Culture adequate volume Performed at [facility], [address], [place], [place] [address]    Culture   Final    NO GROWTH 5 DAYS Performed at [facility], [address], [place], [place] [address]    Report Status 07/05/2020 FINAL  Final  Body fluid culture w Gram Stain     Status: None   Collection Time: 06/30/20 10:01 AM   Specimen: Pleural Fluid  Result Value Ref Range Status   Specimen Description   Final    PLEURAL Performed at [facility], [address], [place], [place] [address]    Special Requests   Final    NONE Performed at [facility], [address] [place], [place] [address]    Gram Stain   Final    ABUNDANT WBC PRESENT,BOTH PMN AND MONONUCLEAR ABUNDANT GRAM POSITIVE COCCI Performed at [facility], [address] [place], [place] [address]    Culture ABUNDANT STREPTOCOCCUS INTERMEDIUS  Final   Report Status 07/02/2020 FINAL  Final   Organism ID, Bacteria STREPTOCOCCUS INTERMEDIUS  Final      Susceptibility   Streptococcus intermedius - MIC*    PENICILLIN <=0.06 SENSITIVE Sensitive     CEFTRIAXONE 0.25 SENSITIVE Sensitive     ERYTHROMYCIN <=0.12 SENSITIVE Sensitive     LEVOFLOXACIN 0.5 SENSITIVE Sensitive     VANCOMYCIN 0.5 SENSITIVE Sensitive     * ABUNDANT STREPTOCOCCUS INTERMEDIUS  MRSA PCR Screening     Status: None   Collection Time: 06/30/20  4:17 PM   Specimen: Nasopharyngeal  Result Value Ref Range Status   MRSA by PCR NEGATIVE NEGATIVE Final    Comment:        The GeneXpert MRSA Assay (FDA approved for NASAL specimens only), is one component of a comprehensive MRSA colonization surveillance program. It is not intended to  diagnose MRSA infection nor to guide or monitor treatment for MRSA infections. Performed at [facility], [address] [place], [place] [address]   Body fluid culture w Gram Stain     Status: None   Collection Time: 07/01/20 12:30 PM   Specimen: Pleura; Body Fluid  Result Value Ref Range Status   Specimen Description   Final    PLEURAL LT Performed at [facility], [address] [place], [place] [address]    Special Requests   Final    NONE Performed at [facility], [address], [place], [place] [address]    Gram Stain   Final    ABUNDANT WBC PRESENT,BOTH PMN AND MONONUCLEAR ABUNDANT GRAM POSITIVE COCCI    Culture   Final    ABUNDANT STREPTOCOCCUS INTERMEDIUS SUSCEPTIBILITIES PERFORMED ON PREVIOUS CULTURE WITHIN THE LAST 5 DAYS. Performed at [facility], [address] [place], [place] [address]    Report Status 07/03/2020 FINAL  Final  Aerobic/Anaerobic Culture w Gram Stain (surgical/deep wound)     Status: None (Preliminary result)   Collection Time: 07/03/20  4:14 PM   Specimen: Abscess  Result Value Ref Range Status   Specimen Description   Final    ABSCESS Performed at [facility], [address], [place], [place] [address]    Special Requests   Final    Normal Performed at [facility], [address], [place], [place] [address]    Gram Stain   Final    ABUNDANT WBC PRESENT,BOTH PMN AND MONONUCLEAR MODERATE GRAM POSITIVE COCCI    Culture   Final    NO GROWTH 4 DAYS NO ANAEROBES ISOLATED; CULTURE IN PROGRESS FOR 5 DAYS Performed at [facility], [address] [place], [place] [address]    Report Status PENDING  Incomplete    Pertinent Lab. CBC Latest Ref Rng & Units 07/07/2020 07/06/2020 07/05/2020  WBC 4.0 - 10.5 K/uL 9.5 13.1(H) 8.0  Hemoglobin 12.0 - 15.0 g/dL 1.5(Q) 0.0(Q) 6.7(Y)  Hematocrit 36.0 - 46.0 % 25.7(L) 29.6(L) 28.2(L)   Platelets 150 - 400 K/uL 511(H) 722(H) 540(H)   CMP Latest Ref Rng & Units 07/07/2020 07/06/2020 07/05/2020  Glucose 70 - 99 mg/dL 92 98 99  BUN 8 - 23 mg/dL 6(L) 8 11  Creatinine 1.95 - 1.00 mg/dL 0.93 2.67 1.24  Sodium 135 - 145 mmol/L 137 137 138  Potassium 3.5 - 5.1 mmol/L 3.3(L) 3.8 4.0  Chloride 98 - 111 mmol/L 102 100 105  CO2 22 - 32 mmol/L 27 29 27   Calcium 8.9 - 10.3 mg/dL ) 9.0 5.8(K)  Total Protein 6.5 - 8.1 g/dL 9.9(I) 6.2(L) 5.2(L)  Total Bilirubin 0.3 - 1.2 mg/dL 0.3 0.5 0.6  Alkaline Phos 38 - 126 U/L 93 114 92  AST 15 - 41 U/L 68(H) 92(H) 53(H)  ALT 0 - 44 U/L 67(H) 79(H) 48(H)     Pertinent Imaging today Plain films and CT images have been personally visualized and interpreted; radiology reports have been reviewed. Decision making incorporated into the Impression / Recommendations.  Left Chest Xray 07/07/20 IMPRESSION: Left chest tube in stable position. Stable left-sided pneumothorax. Stable left-sided pleural thickening and or effusion. Stable atelectatic changes left lung. Stable elevation left hemidiaphragm.  I have spent more than 35 minutes for this patient encounter including review of prior medical records, coordination of care  with greater than 50% of time being face to face/counseling and discussing diagnostics/treatment plan with the patient/family.  Electronically signed by:   Odette FractionSabina Messiah Ahr, MD Infectious Disease Physician [facility] Pager: [phone]

## 2020-07-08 NOTE — Assessment & Plan Note (Addendum)
--  repeat CT showed loculations.  CT surgery consulted.  VATS recommended.  Patient does not wish to proceed with surgery.   --Chest tube out 6/28. Home tomorrow on IV abx.

## 2020-07-08 NOTE — Hospital Course (Addendum)
65 year old woman presented with shortness of breath and left-sided chest pain, found to have complete opacification of left hemithorax showing left-sided pleural effusion on CT.  Chest tube placed 6/14, 6/15.  Ultimately culture grew Streptococcus intermedius.  Seen by infectious disease.  Weaned off oxygen. Repeat CT chest showed loculations.  PCCM, ID, CT surgery and hospitalist recommend VATS, patient declines.  Chest tube removed today. Await PICC line placement and plan discharge home 6/29 on IV abx with outpatient f/u with pulmonology and ID.

## 2020-07-09 DIAGNOSIS — J90 Pleural effusion, not elsewhere classified: Secondary | ICD-10-CM

## 2020-07-09 DIAGNOSIS — J869 Pyothorax without fistula: Secondary | ICD-10-CM

## 2020-07-09 DIAGNOSIS — D649 Anemia, unspecified: Secondary | ICD-10-CM

## 2020-07-09 LAB — GLUCOSE, CAPILLARY: Glucose-Capillary: 95 mg/dL (ref 70–99)

## 2020-07-09 MED ORDER — FENTANYL CITRATE (PF) 100 MCG/2ML IJ SOLN
25.0000 ug | Freq: Once | INTRAMUSCULAR | Status: AC
  Administered 2020-07-09: 25 ug via INTRAVENOUS

## 2020-07-09 MED ORDER — FENTANYL CITRATE (PF) 100 MCG/2ML IJ SOLN
50.0000 ug | Freq: Once | INTRAMUSCULAR | Status: AC
  Administered 2020-07-09: 50 ug via INTRAVENOUS
  Filled 2020-07-09: qty 2

## 2020-07-09 NOTE — Assessment & Plan Note (Signed)
--  continue Boost Breeze once/day and [employer] once/day.

## 2020-07-09 NOTE — Assessment & Plan Note (Addendum)
--  continue abx, follow-up as outpatient with pulmonology and ID.

## 2020-07-09 NOTE — Assessment & Plan Note (Addendum)
--   Hemoglobin stable.  Follow periodically.

## 2020-07-09 NOTE — Assessment & Plan Note (Addendum)
resolved 

## 2020-07-09 NOTE — Progress Notes (Signed)
Patient calling out c/o intense sharp pain in left chest.  Oxygen 97% on RA. Pt states the stabbing feeling comes and goes. She stated this happened the previous night but not as intense.  Robaxin and ibuprofen given per patient request.  MD paged and rapid called to come assess chest tube.

## 2020-07-09 NOTE — Progress Notes (Signed)
NAME:  Jamie Carr MRN:  [id number] DOB:  1955-07-20 LOS: 9 ADMISSION DATE:  06/30/2020 CONSULTATION DATE:  06/30/2020 REFERRING MD: Dr. Wilkie Aye - EDP CHIEF COMPLAINT:  SOB, Pleural effusion   History of Present Illness:  65 year old female with PMHx significant for epilepsy, thyroid disease and tremors who presented to [facility] with complaints of dyspnea and left-sided chest pain x 2-3 weeks (worse with deep breathing and coughing, radiating to L shoulder). Dyspnea was progressive prompting her to seek medical attention 6/14.   In the ED, patient was tachypneic but no hypoxia was observed. CXR demonstrated complete opacification of the left hemithorax suggestive of large pleural effusion. CT Chest obtained to further characterize effusion showed large left-sided pleural effusion as well as concern for tumor at the left hilum and a pleural based mass.   PCCM asked to consult for effusion management.   Pertinent Medical History:  Epilepsy, thyroid disease, tremor, myopia, presbyopia  Significant Hospital Events:  6/14 - Admit to [facility], transfer to [facility], [facility] consult for management of effusion, CT placed by EDP 6/15 - Increased pain at CT insertion site, CXR demonstrating possible kink at insertion site/L chest wall. Large-bore CT placed at bedside (Dr. Katrinka Blazing). Episode of respiratory distress with hypotension post-CT placement, thought to be med effect versus reexpansion pulm edema. Improved with Fentanyl/Precedex, weaned off of Precedex. BPs improved with fluid resuscitation.  Culture ultimately grew Streptococcus intermedius [LDH greater than 10,000].  No malignant cells 6/16 - Improved CT drainage overnight, stable O2 needs. CXR with marginally increased PTX, decreased effusion, asymmetrical pulmonary edema on L. Transferred to floor.  Was able to come off oxygen 6/17 - Improved respiratory status and O2 needs. CT Chest demonstrating 3.5 x 4cm loculated collection along with pneumothorax  ex vacuo vs. Mass in the abdominal wall ->  IR to attempt aspiration today -> no growth as of 6/19.  Noticed to be on 2 L nasal cannula which she says was Comfort.  Infectious disease consultation recommending 4 weeks of antibiotics and continue Unasyn for now. 07/04/2020: [facility] hospitalist conversation with thoracic surgery: No role for VATS.  Recommend bronchoscopy consideration for mediastinal nodes. 6/20 No acute events overnight and patient is now on RA and feels well. Large bore CT remains in place and drained in the last 24hrs. PTX slight worse on CXR 6/21 no recorded chest tube output overnight  Repeat chest CT penging  Interim History / Subjective:   Comfortable and in no respiratory distress Approximately 50 cc chest tube output last 24 hours, purulent fluid CT chest performed 6/22 as below  Objective:  Blood pressure 125/76, pulse 91, temperature 98.4 F (36.9 C), temperature source Oral, resp. rate 16, height 5\' 2"  (1.575 m), weight 58 kg, SpO2 94 %.        Intake/Output Summary (Last 24 hours) at 07/09/2020 1018 Last data filed at 07/09/2020 0043 Gross per 24 hour  Intake 240 ml  Output 51 ml  Net 189 ml   Filed Weights   06/30/20 0625 07/02/20 1812  Weight: 55.3 kg 58 kg    Physical Exam General: Pleasant middle-aged female sitting up in bedside recliner no acute distress HEENT: [place]/AT, MM pink/moist, PERRL,  Neuro: Alert and oriented x3, nonfocal CV: s1s2 regular rate and rhythm, no murmur, rubs, or gallops,  PULM: Clear to auscultation bilaterally, no increased work of breathing, no added breath sounds GI: soft, bowel sounds active in all 4 quadrants, non-tender, non-distended, tolerating oral diet Extremities: warm/dry, no  edema  Skin: no rashes or lesions  Labs/imaging that I have personally reviewed:  CXR 6/16 Chest tube on left, unchanged, with left-sided pneumothorax, marginally larger. Airspace opacity consistent with pneumonia left base. Asymmetric  pulmonary edema on the left is stable. CXR 6/17 Empyema with stable left chest tube. Poor re-expansion of the left lung. Small volume left pneumothorax/pleural gas superimposed on diffuse pleural thickening. CT Chest 6/17 Left lateral approach thoracostomy tube with tip located in the major fissure, with a small moderate left pneumothorax. There is extensive nodular thickening of the parietal and visceral pleura throughout the left hemithorax with incomplete re-expansion of the left lung suggestive of the entrapment. Mass like area along the left anterior abdominal measuring 4 cm which is favored to represent loculated fluid along the costophrenic angle as opposed to a discrete chest wall mass. Multifocal consolidations, nodularity and ground-glass opacities with septal thickening throughout the left lung.  Lymphadenopathy Pleural Fluid Cx 6/16 - abundant WBC, abundant gram positive cocci -> strep intermedius Pleural Fluid LDH 6/16 - >10,000 Pleural Fluid Glucose 6/16 - 43 Pleural Fluid Cytology 6/15 - No malignant cells identified, acute inflammation 6/22 Chest CT >> persistent thick-walled left-sided air-fluid collection with pleural thickening, pleural adhesions at the left base.  Chest tube is in place posteriorly.  Residual left apical fluid collection with an air-fluid level slightly increased.  Improving left-sided pneumonia.  Resolved Hospital Problem List:   Acute hypoxic respiratory failure  Assessment & Plan:  Streptococcus intermedius empyema, POA -Pansensitive  Large left sided pleural effusion, POA -status post large bore left-sided chest tube Left pleural mass>> drained, abscess Hilar mediastinal adenopathy and cervical lymphadenopathy P: -Continue current antibiotics -Chest tube output has decreased, has persistent areas of loculated fluid and air.  Have reviewed CT chest with Dr. Cliffton Asters with [facility].  Best outcome would likely be achieved with VATS and cleanout, decortication.   Best outcome if this were done early/now.  She is hesitant about this, wants to speak with her own physicians, possibly second opinion.  Will ask Dr. Cliffton Asters to meet her, explain the procedure, rationale and answered her questions. -If she elects to hold off on VATS, then we will need to decide how long to leave her chest tube in place.  There may be some role for DNase/t-PA instillation, although literature does not really support good outcomes in this setting.  If output is <50 cc and no role for lytics then could consider DC chest tube and treat with prolonged course of antibiotics until we follow-up CT chest.  I think that this would be unlikely to clear her complicated pleural space and that ultimately she would need to revisit possible VATS, possibly a more challenging procedure as the space becomes more complicated.    SIGNATURE   Levy Pupa, MD, PhD 07/09/2020, 10:27 AM [facility] [phone] or if no answer before 7:00PM call [phone] For any issues after 7:00PM please call eLink [phone]

## 2020-07-09 NOTE — Progress Notes (Signed)
     [address]       [place] [address]             [phone]        66 yo female admitted with left empyema and loculated pleural effusion.  S/p chest tube drainage, with clearance of fluid, but cross-sectional imaging showed a dense peal, with entrapped lung.  Surgical decortication was offered, but the patient refused.  She would like to be treated with antibiotics for its 6 week course.    I explained to her that surgery would off a quicker recovery.  Given that the lung is entrapped, removal of the chest tube will likely result in re-accumulation of the fluid.  Additionally, if surgery is delayed a full 6 weeks, a minimally invasive option is less likely.  Despite explaining this, she continued to refuse surgery.  Recs: Continue CT drainage.  Would manage as empyema tube, and back out slowly over several weeks.  Please call with questions.  Yesica Kemler Keane Scrape

## 2020-07-09 NOTE — Progress Notes (Addendum)
PROGRESS NOTE  Jamie Carr WGN:[id number] DOB: 12/20/1955 DOA: 06/30/2020 PCP: Henreitta Leber, PA-C  Brief History   65 year old woman presented with shortness of breath and left-sided chest pain, found to have complete opacification of left hemithorax showing left-sided pleural effusion on CT.  Chest tube placed 6/14, 6/15.  Ultimately culture grew Streptococcus intermedius.  Seen by infectious disease.  Weaned off oxygen. Repeat CT chest shows loculations. PCCM discussing w/ CT surgery.  A & P  * Empyema (HCC) secondary to Strep intermedius w/ associated left-sided pleural effusion --repeat CT showed loculations, will continue chest tube per PCCM -- PCCM d/w CT surgery --continue abx  Hilar mass, left pleural mass, mediastinal adenopathy thought secondary to reactive infection --continue abx, management per PCCM  Anemia --follow clinically, CBC in AM  Elevated transaminase level --minimal elevation, follow periodically  Moderate malnutrition (HCC) --continue Boost Breeze once/day and [employer] once/day.  Disposition Plan:  Discussion:   Status is: Inpatient  Remains inpatient appropriate because:IV treatments appropriate due to intensity of illness or inability to take PO and Inpatient level of care appropriate due to severity of illness  Dispo: The patient is from: Home              Anticipated d/c is to: Home              Patient currently is not medically stable to d/c.   Difficult to place patient No  DVT prophylaxis: enoxaparin (LOVENOX) injection 40 mg Start: 06/30/20 2200   Code Status: Full Code Level of care: Telemetry Family Communication: none  Brendia Sacks, MD  [facility] Direct contact: see [email or web address] (further directions at bottom of note if needed) 7PM-7AM contact night coverage as at bottom of note 07/09/2020, 10:25 AM  LOS: 9 days    Consults:  Pulmonology   Interval History/Subjective  CC: f/u SOB  Feels fine today, breathing fine,  eating fine  Review of Systems  Respiratory:  Negative for shortness of breath.   Gastrointestinal:  Negative for nausea and vomiting.   Objective   Vitals:  Vitals:   07/08/20 2116 07/09/20 0450  BP: 114/65 125/76  Pulse: 94 91  Resp: 18 16  Temp: 98.5 F (36.9 C) 98.4 F (36.9 C)  SpO2: 97% 94%    Exam: Physical Exam Vitals and nursing note reviewed.  Constitutional:      General: She is not in acute distress.    Appearance: She is not ill-appearing or toxic-appearing.  Cardiovascular:     Rate and Rhythm: Normal rate and regular rhythm.  Pulmonary:     Effort: Pulmonary effort is normal. No tachypnea.     Breath sounds: No wheezing, rhonchi or rales.  Neurological:     Mental Status: She is alert.  Psychiatric:        Mood and Affect: Mood normal.        Behavior: Behavior normal.    I have personally reviewed the labs and other data, making special note of:   Today's Data  CT chest noted   Scheduled Meds:  chlorhexidine  15 mL Mouth Rinse BID   Chlorhexidine Gluconate Cloth  6 each Topical Daily   docusate  100 mg Per Tube BID   enoxaparin (LOVENOX) injection  40 mg Subcutaneous Q24H   feeding supplement  1 Container Oral Q24H   feeding supplement (KATE FARMS STANDARD 1.4)  325 mL Oral Daily   loratadine  10 mg Oral QHS   mouth rinse  15 mL Mouth Rinse  q12n4p   multivitamin with minerals  1 tablet Oral Daily   polyethylene glycol  17 g Per Tube Daily   vitamin C  250 mg Oral Daily   Continuous Infusions:  sodium chloride 250 mL (07/09/20 0938)   ampicillin-sulbactam (UNASYN) IV 3 g (07/09/20 0527)    Principal Problem:   Empyema (HCC) secondary to Strep intermedius w/ associated left-sided pleural effusion Active Problems:   Hilar mass, left pleural mass, mediastinal adenopathy thought secondary to reactive infection   Moderate malnutrition (HCC)   Elevated transaminase level   Anemia   Pleural effusion   LOS: 9 days   How to contact the  [facility] Attending or Consulting provider 7A - 7P or covering provider during after hours 7P -7A, for this patient?  Check the care team in [facility] and look for a) attending/consulting TRH provider listed and b) the [facility] team listed Log into [email or web address] and use [place]'s universal password to access. If you do not have the password, please contact the hospital operator. Locate the [facility] provider you are looking for under [facility] and page to a number that you can be directly reached. If you still have difficulty reaching the provider, please page the [facility] (Director on Call) for the Hospitalists listed on amion for assistance.

## 2020-07-10 LAB — COMPREHENSIVE METABOLIC PANEL
ALT: 56 U/L — ABNORMAL HIGH (ref 0–44)
AST: 57 U/L — ABNORMAL HIGH (ref 15–41)
Albumin: 2.4 g/dL — ABNORMAL LOW (ref 3.5–5.0)
Alkaline Phosphatase: 92 U/L (ref 38–126)
Anion gap: 11 (ref 5–15)
BUN: 8 mg/dL (ref 8–23)
CO2: 24 mmol/L (ref 22–32)
Calcium: 8.6 mg/dL — ABNORMAL LOW (ref 8.9–10.3)
Chloride: 101 mmol/L (ref 98–111)
Creatinine, Ser: 0.64 mg/dL (ref 0.44–1.00)
GFR, Estimated: >60 mL/min (ref >60)
Glucose, Bld: 88 mg/dL (ref 70–99)
Potassium: 2.9 mmol/L — ABNORMAL LOW (ref 3.5–5.1)
Sodium: 136 mmol/L (ref 135–145)
Total Bilirubin: 0.5 mg/dL (ref 0.3–1.2)
Total Protein: 5.9 g/dL — ABNORMAL LOW (ref 6.5–8.1)

## 2020-07-10 LAB — GLUCOSE, CAPILLARY: Glucose-Capillary: 87 mg/dL (ref 70–99)

## 2020-07-10 LAB — CBC
HCT: 27.9 % — ABNORMAL LOW (ref 36.0–46.0)
Hemoglobin: 8.9 g/dL — ABNORMAL LOW (ref 12.0–15.0)
MCH: 31.3 pg (ref 26.0–34.0)
MCHC: 31.9 g/dL (ref 30.0–36.0)
MCV: 98.2 fL (ref 80.0–100.0)
Platelets: 531 10*3/uL — ABNORMAL HIGH (ref 150–400)
RBC: 2.84 MIL/uL — ABNORMAL LOW (ref 3.87–5.11)
RDW: 14.7 % (ref 11.5–15.5)
WBC: 10.8 10*3/uL — ABNORMAL HIGH (ref 4.0–10.5)
nRBC: 0 % (ref 0.0–0.2)

## 2020-07-10 MED ORDER — POTASSIUM CHLORIDE CRYS ER 20 MEQ PO TBCR
40.0000 meq | EXTENDED_RELEASE_TABLET | ORAL | Status: AC
  Administered 2020-07-10 (×2): 40 meq via ORAL
  Filled 2020-07-10 (×2): qty 2

## 2020-07-10 MED ORDER — POTASSIUM CHLORIDE CRYS ER 20 MEQ PO TBCR
40.0000 meq | EXTENDED_RELEASE_TABLET | Freq: Once | ORAL | Status: AC
  Administered 2020-07-10: 40 meq via ORAL
  Filled 2020-07-10: qty 2

## 2020-07-10 NOTE — Progress Notes (Signed)
NAME:  Jamie Carr MRN:  [id number] DOB:  12-29-1955 LOS: 10 ADMISSION DATE:  06/30/2020 CONSULTATION DATE:  06/30/2020 REFERRING MD: Dr. Wilkie Aye - EDP CHIEF COMPLAINT:  SOB, Pleural effusion   History of Present Illness:  65 year old female with PMHx significant for epilepsy, thyroid disease and tremors who presented to [facility] ED with complaints of dyspnea and left-sided chest pain x 2-3 weeks (worse with deep breathing and coughing, radiating to L shoulder). Dyspnea was progressive prompting her to seek medical attention 6/14.   In the ED, patient was tachypneic but no hypoxia was observed. CXR demonstrated complete opacification of the left hemithorax suggestive of large pleural effusion. CT Chest obtained to further characterize effusion showed large left-sided pleural effusion as well as concern for tumor at the left hilum and a pleural based mass.   PCCM asked to consult for effusion management.   Pertinent Medical History:  Epilepsy, thyroid disease, tremor, myopia, presbyopia  Significant Hospital Events:  6/14 - Ad[facility] to [facility], transfer to [facility], [facility] consult for management of effusion, CT placed by EDP 6/15 - Increased pain at CT insertion site, CXR demonstrating possible kink at insertion site/L chest wall. Large-bore CT placed at bedside (Dr. Katrinka Blazing). Episode of respiratory distress with hypotension post-CT placement, thought to be med effect versus reexpansion pulm edema. Improved with Fentanyl/Precedex, weaned off of Precedex. BPs improved with fluid resuscitation.  Culture ultimately grew Streptococcus intermedius [LDH greater than 10,000].  No malignant cells 6/16 - Improved CT drainage overnight, stable O2 needs. CXR with marginally increased PTX, decreased effusion, asymmetrical pulmonary edema on L. Transferred to floor.  Was able to come off oxygen 6/17 - Improved respiratory status and O2 needs. CT Chest demonstrating 3.5 x 4cm loculated collection along with pneumothorax  ex vacuo vs. Mass in the abdominal wall ->  IR to attempt aspiration today -> no growth as of 6/19.  Noticed to be on 2 L nasal cannula which she says was Comfort.  Infectious disease consultation recommending 4 weeks of antibiotics and continue Unasyn for now. 07/04/2020: [facility] conversation with thoracic surgery: No role for VATS.  Recommend bronchoscopy consideration for mediastinal nodes. 6/20 No acute events overnight and patient is now on RA and feels well. Large bore CT remains in place and drained in the last 24hrs. PTX slight worse on CXR 6/21 no recorded chest tube output overnight  Repeat chest CT penging  Interim History / Subjective:  Did have chest pain and discomfort last evening Still with significant effluent from chest tube Did discuss with patient that if effluent is less than 100 cc in 24 hours, will consider taking the tube out  Was seen by Dr. Nicholes Calamity not want surgery  Objective:  Blood pressure 128/68, pulse 78, temperature 98.5 F (36.9 C), temperature source Oral, resp. rate 16, height 5\' 2"  (1.575 m), weight 58 kg, SpO2 96 %.        Intake/Output Summary (Last 24 hours) at 07/10/2020 1059 Last data filed at 07/10/2020 0930 Gross per 24 hour  Intake 270 ml  Output 422 ml  Net -152 ml   Filed Weights   06/30/20 0625 07/02/20 1812  Weight: 55.3 kg 58 kg    Physical Exam General: Does not appear to be in distress at rest HEENT: Moist oral mucosa,  Neuro: Alert and oriented x3  CV: S1-S2 appreciated PULM: Clear breath sounds bilaterally GI: Soft, bowel sounds appreciated Extremities: warm/dry, no edema  Skin: no rashes or lesions  Labs/imaging  that I have personally reviewed:  CXR 6/16 Chest tube on left, unchanged, with left-sided pneumothorax, marginally larger. Airspace opacity consistent with pneumonia left base. Asymmetric pulmonary edema on the left is stable. CXR 6/17 Empyema with stable left chest tube. Poor re-expansion of  the left lung. Small volume left pneumothorax/pleural gas superimposed on diffuse pleural thickening. CT Chest 6/17 Left lateral approach thoracostomy tube with tip located in the major fissure, with a small moderate left pneumothorax. There is extensive nodular thickening of the parietal and visceral pleura throughout the left hemithorax with incomplete re-expansion of the left lung suggestive of the entrapment. Mass like area along the left anterior abdominal measuring 4 cm which is favored to represent loculated fluid along the costophrenic angle as opposed to a discrete chest wall mass. Multifocal consolidations, nodularity and ground-glass opacities with septal thickening throughout the left lung.  Lymphadenopathy Pleural Fluid Cx 6/16 - abundant WBC, abundant gram positive cocci -> strep intermedius Pleural Fluid LDH 6/16 - >10,000 Pleural Fluid Glucose 6/16 - 43 Pleural Fluid Cytology 6/15 - No malignant cells identified, acute inflammation 6/22 Chest CT >> persistent thick-walled left-sided air-fluid collection with pleural thickening, pleural adhesions at the left base.  Chest tube is in place posteriorly.  Residual left apical fluid collection with an air-fluid level slightly increased.  Improving left-sided pneumonia.  Resolved Hospital Problem List:   Acute hypoxic respiratory failure  Assessment & Plan:  Streptococcus intermedius empyema-present on admission -Pansensitive -Currently on Unasyn -Plan will be to transition to Augmentin  Large left-sided pleural effusion Present on admission -Has a large bore chest tube in place at present -Pain management for chest pain  Hilar/mediastinal adenopathy -Likely reactive -Needs to be followed closely  Plan is to continue current antibiotics still has a loculated collection however patient does not desire to have surgery Did have conversation with Dr. Delton Coombes, Dr. Cliffton Asters, and myself regarding options of care -I do believe the most  appropriate intervention will be surgical intervention to completely clear the pleural space, patient does not want to have surgery -Will continue antibiotics and follow  The concern is that once chest tube is taken out, she may reaccumulate fluid requiring further intervention.  Earlier surgical intervention has better outcomes than if we wait for much longer-May develop protracted scarring  Plan will be to discontinue chest tube once we document less than 100 cc output in 24 hours  Virl Diamond, MD  PCCM Pager: [phone]

## 2020-07-10 NOTE — Progress Notes (Addendum)
    BRIEF OVERNIGHT PROGRESS REPORT  Rapid response  Response called by RN for patient having severe pain in her chest tube site.  Upon arrival patient was standing at the bedside with cloudy yellow drainage in her chest tube there was no blood noted. Her work of breathing was tachypneaic but she was not using accessory muscles Auscultation provided clear lung sounds bilaterally.  Inspection of her chest tube site showed no bleeding and there was fluctuation in the chest tube fluid with no leak found.  Patient rated her pain as a 10 out of 10 at the site and an 8 out of 10 generally when she attempted to move.   We attempted to sit her on the bedside she was unable to without severe pain but was able to control her breathing on her own.  SPO2 was 100%.   She was given fentanyl x2 smaller doses and was then able to sit in the recliner on her own power after the second dose and within 2 to 4 minutes was conversational rated her pain a 0-1 out of 10 and in no obvious or stated distress.  She intends to stay in the chair with pillows for the remainder of the night and upright semireclined position.She was also instructed on how her pain medications would wear off and the importance of staying ahead on pain control before events through the day that may cause increased pain during recovery.  She was also instructed on use of her incentive spirometer and was able to repeat instructions and follow-through with nursing staff  She has had no reported difficulty for the rest of the night and has had no increased work of breathing and no increased oxygen requirement.   Chinita Greenland MSNA ACNPC-AG Acute Care Nurse Practitioner [facility]

## 2020-07-10 NOTE — Progress Notes (Signed)
ID Brief Note  Patient seen  Afebrile, mild leukocytosis RR was called for intense pain at the chest tube site which was controlled after analgesics. Spo0 100% in room air  Per CT surgery, patient has refused VATS/decortication  I was informed by RN that patient would like to talk to me. She had questions about whether antibiotics would be able to clear her infection. I have clearly told her that antibiotics have an adjunctive role and it is always preferred to have a surgical intervention done for source control in any infection including hers where she has loculated fluid in the left pleural space. I also told her that antibiotics have poor penetration in the loculated collections in her pleural space.   She also had questions about getting a second opinion and also getting VATS a little later when she gets  more stronger. I told her that it is better to have the procedure done earlier than later. But I will defer that to CT surgery/PCCM.    Will possibly need antibiotics for longer duration than 4 weeks ( possibly 6 weeks) if no surgical intervention with follow up needed radiographically  All cultures have finalized and no new updates Continue Unasyn as is Chest tube management per CT surgery  Dr Renold Don is available over the weekend with questions. Otherwise I will follow up on Monday  Odette Fraction, MD Infectious Disease Physician [facility] [address] [place], [place] [address] Phone: [phone]  Fax: [phone]

## 2020-07-10 NOTE — Progress Notes (Signed)
PROGRESS NOTE  Jamie Carr HDQ:[id number] DOB: 1955/05/09 DOA: 06/30/2020 PCP: Henreitta Leber, PA-C  Brief History   65 year old woman presented with shortness of breath and left-sided chest pain, found to have complete opacification of left hemithorax showing left-sided pleural effusion on CT.  Chest tube placed 6/14, 6/15.  Ultimately culture grew Streptococcus intermedius.  Seen by infectious disease.  Weaned off oxygen. Repeat CT chest shows loculations.  PCCM and CT surgery recommend VATS, patient declines.   A & P  * Empyema (HCC) secondary to Strep intermedius w/ associated left-sided pleural effusion --repeat CT showed loculations, will continue chest tube per PCCM.  CT surgery consulted.  VATS recommended.  Patient does not wish to proceed with surgery.  Continue chest tube management per pulmonology. --continue abx  Hilar mass, left pleural mass, mediastinal adenopathy thought secondary to reactive infection --continue abx, management per PCCM  Anemia -- Hemoglobin stable.  Follow periodically.  Elevated transaminase level --minimal elevation, follow periodically  Moderate malnutrition (HCC) --continue Boost Breeze once/day and [employer] once/day.  Disposition Plan:  Discussion: Currently refusing surgery.  Continue antibiotics and chest tube management.  Status is: Inpatient  Remains inpatient appropriate because:IV treatments appropriate due to intensity of illness or inability to take PO and Inpatient level of care appropriate due to severity of illness  Dispo: The patient is from: Home              Anticipated d/c is to: Home              Patient currently is not medically stable to d/c.   Difficult to place patient No  DVT prophylaxis: enoxaparin (LOVENOX) injection 40 mg Start: 06/30/20 2200   Code Status: Full Code Level of care: Telemetry Family Communication: none  Brendia Sacks, MD  [facility] Direct contact: see [email or web address] (further directions at  bottom of note if needed) 7PM-7AM contact night coverage as at bottom of note 07/10/2020, 2:41 PM  LOS: 10 days    Consults:  Pulmonology   Interval History/Subjective  CC: f/u SOB  Rough night last night, w/ severe chest pain near CT site, relieved w/ fentanyl  No new pain now  No SOB with this pain or now  ROS  Objective   Vitals:  Vitals:   07/10/20 0430 07/10/20 1323  BP: 128/68 139/70  Pulse: 78 100  Resp: 16 20  Temp: 98.5 F (36.9 C) 98.2 F (36.8 C)  SpO2: 96% 97%    Exam: Physical Exam Vitals and nursing note reviewed.  Constitutional:      Appearance: Normal appearance.  Cardiovascular:     Rate and Rhythm: Normal rate and regular rhythm.     Heart sounds: No murmur heard.   No gallop.  Pulmonary:     Effort: Pulmonary effort is normal. No respiratory distress.     Breath sounds: Normal breath sounds. No wheezing or rales.  Neurological:     Mental Status: She is alert.  Psychiatric:        Mood and Affect: Mood normal.        Behavior: Behavior normal.    I have personally reviewed the labs and other data, making special note of:   Today's Data  K+ 2.9 AST and ALT stable Hgb stable  Scheduled Meds:  chlorhexidine  15 mL Mouth Rinse BID   Chlorhexidine Gluconate Cloth  6 each Topical Daily   docusate  100 mg Per Tube BID   enoxaparin (LOVENOX) injection  40 mg Subcutaneous Q24H  feeding supplement  1 Container Oral Q24H   feeding supplement ([employer] STANDARD 1.4)  325 mL Oral Daily   loratadine  10 mg Oral QHS   mouth rinse  15 mL Mouth Rinse q12n4p   multivitamin with minerals  1 tablet Oral Daily   polyethylene glycol  17 g Per Tube Daily   potassium chloride  40 mEq Oral Q4H   vitamin C  250 mg Oral Daily   Continuous Infusions:  sodium chloride 250 mL (07/09/20 0938)   ampicillin-sulbactam (UNASYN) IV 3 g (07/10/20 1126)    Principal Problem:   Empyema (HCC) secondary to Strep intermedius w/ associated left-sided pleural  effusion Active Problems:   Hilar mass, left pleural mass, mediastinal adenopathy thought secondary to reactive infection   Moderate malnutrition (HCC)   Elevated transaminase level   Anemia   Pleural effusion   LOS: 10 days   How to contact the [facility] Attending or Consulting provider 7A - 7P or covering provider during after hours 7P -7A, for this patient?  Check the care team in [facility] and look for a) attending/consulting TRH provider listed and b) the [facility] team listed Log into [email or web address] and use [place]'s universal password to access. If you do not have the password, please contact the hospital operator. Locate the [facility] provider you are looking for under [facility] and page to a number that you can be directly reached. If you still have difficulty reaching the provider, please page the [facility] (Director on Call) for the Hospitalists listed on amion for assistance.

## 2020-07-11 LAB — GLUCOSE, CAPILLARY: Glucose-Capillary: 80 mg/dL (ref 70–99)

## 2020-07-11 MED ORDER — AMOXICILLIN-POT CLAVULANATE 875-125 MG PO TABS
1.0000 | ORAL_TABLET | Freq: Two times a day (BID) | ORAL | Status: DC
  Administered 2020-07-11: 1 via ORAL
  Filled 2020-07-11: qty 1

## 2020-07-11 MED ORDER — SODIUM CHLORIDE 0.9 % IV SOLN
2.0000 g | INTRAVENOUS | Status: DC
  Administered 2020-07-11 – 2020-07-12 (×2): 2 g via INTRAVENOUS
  Filled 2020-07-11 (×2): qty 2
  Filled 2020-07-11: qty 20

## 2020-07-11 NOTE — Progress Notes (Addendum)
NAME:  Jamie Carr MRN:  [id number] DOB:  [birth year] LOS: 11 ADMISSION DATE:  06/30/2020 CONSULTATION DATE:  06/30/2020 REFERRING MD: Dr. Wilkie Aye - EDP CHIEF COMPLAINT:  SOB, Pleural effusion   History of Present Illness:  65 year old female with PMHx significant for epilepsy, thyroid disease and tremors who presented to [facility] ED with complaints of dyspnea and left-sided chest pain x 2-3 weeks (worse with deep breathing and coughing, radiating to L shoulder). Dyspnea was progressive prompting her to seek medical attention 6/14.   In the ED, patient was tachypneic but no hypoxia was observed. CXR demonstrated complete opacification of the left hemithorax suggestive of large pleural effusion. CT Chest obtained to further characterize effusion showed large left-sided pleural effusion as well as concern for tumor at the left hilum and a pleural based mass.   PCCM asked to consult for effusion management.   Pertinent Medical History:  Epilepsy, thyroid disease, tremor, myopia, presbyopia  Significant Hospital Events:  6/14 - Admit to [facility], transfer to [facility] consult for management of effusion, CT placed by EDP 6/15 - Increased pain at CT insertion site, CXR demonstrating possible kink at insertion site/L chest wall. Large-bore CT placed at bedside (Dr. Katrinka Blazing). Episode of respiratory distress with hypotension post-CT placement, thought to be med effect versus reexpansion pulm edema. Improved with Fentanyl/Precedex, weaned off of Precedex. BPs improved with fluid resuscitation.  Culture ultimately grew Streptococcus intermedius [LDH greater than 10,000].  No malignant cells 6/16 - Improved CT drainage overnight, stable O2 needs. CXR with marginally increased PTX, decreased effusion, asymmetrical pulmonary edema on L. Transferred to floor.  Was able to come off oxygen 6/17 - Improved respiratory status and O2 needs. CT Chest demonstrating 3.5 x 4cm loculated collection along with pneumothorax  ex vacuo vs. Mass in the abdominal wall ->  IR to attempt aspiration today -> no growth as of 6/19.  Noticed to be on 2 L nasal cannula which she says was Comfort.  Infectious disease consultation recommending 4 weeks of antibiotics and continue Unasyn for now. 07/04/2020: [facility] hospitalist conversation with thoracic surgery: No role for VATS.  Recommend bronchoscopy consideration for mediastinal nodes. 6/20 No acute events overnight and patient is now on RA and feels well. Large bore CT remains in place and drained in the last 24hrs. PTX slight worse on CXR 6/21 no recorded chest tube output overnight  Repeat chest CT penging  Interim History / Subjective:  Did have chest pain and discomfort evening of the 23rd Chest tube effluent documented at 52 cc  Was seen by Dr. Nicholes Calamity not want surgery  Objective:  Blood pressure 135/85, pulse 77, temperature 97.9 F (36.6 C), temperature source Oral, resp. rate 20, height 5\' 2"  (1.575 m), weight 58 kg, SpO2 98 %.        Intake/Output Summary (Last 24 hours) at 07/11/2020 1004 Last data filed at 07/11/2020 0730 Gross per 24 hour  Intake 240 ml  Output 30 ml  Net 210 ml   Filed Weights   06/30/20 0625 07/02/20 1812  Weight: 55.3 kg 58 kg    Physical Exam Did not examine  Labs/imaging that I have personally reviewed:  CXR 6/16 Chest tube on left, unchanged, with left-sided pneumothorax, marginally larger. Airspace opacity consistent with pneumonia left base. Asymmetric pulmonary edema on the left is stable. CXR 6/17 Empyema with stable left chest tube. Poor re-expansion of the left lung. Small volume left pneumothorax/pleural gas superimposed on diffuse pleural thickening. CT Chest 6/17  Left lateral approach thoracostomy tube with tip located in the major fissure, with a small moderate left pneumothorax. There is extensive nodular thickening of the parietal and visceral pleura throughout the left hemithorax with incomplete  re-expansion of the left lung suggestive of the entrapment. Mass like area along the left anterior abdominal measuring 4 cm which is favored to represent loculated fluid along the costophrenic angle as opposed to a discrete chest wall mass. Multifocal consolidations, nodularity and ground-glass opacities with septal thickening throughout the left lung.  Lymphadenopathy Pleural Fluid Cx 6/16 - abundant WBC, abundant gram positive cocci -> strep intermedius Pleural Fluid LDH 6/16 - >10,000 Pleural Fluid Glucose 6/16 - 43 Pleural Fluid Cytology 6/15 - No malignant cells identified, acute inflammation 6/22 Chest CT >> persistent thick-walled left-sided air-fluid collection with pleural thickening, pleural adhesions at the left base.  Chest tube is in place posteriorly.  Residual left apical fluid collection with an air-fluid level slightly increased.  Improving left-sided pneumonia.  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 Delton Coombes, Dr. Cliffton Asters, Dr. Irene Limbo and myself regarding need for surgical intervention -Patient declines surgical intervention at the present time -Other providers have had discussions regarding conservative measures which will be antibiotic therapy not being the most appropriate way of managing the empyema with pleural thickening , persistent loculations. The need for complete clearance of the pleural space to allow for healing and reduce the likelihood of fibrothorax had been discussed  The plan for today  had being to monitor pleural fluid output and if less than 100 cc, plan was to take out the chest tube today I did visit with her today and told her that she had about 52 cc documented as the output, as soon as I started talking she had cautioned me to lower my voice. I did start to discuss tube removal which I described as associated with discomfort because she has had it in place for few days, not associated with significant pain but that we can give her medications to keep her comfortable When she had asked about the medications that can be used I stated we can use any pain medications including an anxiolytic or fentanyl.  She then proceeded to ask me what my experience has been with taking a chest tube out, and I did explain to her that most people do not require any pain medications because it is uncomfortable not painful, but that it depends on the patient's threshold and I was willing to accommodate what ever will work for her. She said she found the discussion conflicting, she also stated that I was being confrontational with her. I did apologize that I had no reason to be confrontational with her and I was trying to get the options across to her  She found my approach confrontational, found my stance confrontational, has had problems with confrontational situations with men and specifically African men. She stated she would rather have the conversation with another physician and I let her know that I am the only pulmonologist available for today and the rest of the weekend. that she may be able to have the discussion with somebody  else on Monday which she will rather.  She stated she would rather keep her chest tube rather than come to any other resolution after I attempted to diffuse the situation with the explanation of not meaning to come across as confrontational, apologized that my voice just carries.  Ultimately, she would rather have the discussion with somebody else Will leave chest tube  in place Will switch to Augmentin which is what she would use long-term-allows [place] to monitor him for few days  I did relay my interaction with her to Dr. Irene Limbo.  Virl Diamond, MD [facility] Pager: [phone]    Addendum: ID input noted  Last dose of Unasyn was 6/25 530AM. Was switched to Augmentin,  ID recommends ceftriaxone-dose to be administered 6/25

## 2020-07-11 NOTE — Progress Notes (Signed)
PROGRESS NOTE  Jamie Carr ZOX:[id number] DOB: 09-02-1955 DOA: 06/30/2020 PCP: Henreitta Leber, PA-C  Brief History   65 year old woman presented with shortness of breath and left-sided chest pain, found to have complete opacification of left hemithorax showing left-sided pleural effusion on CT.  Chest tube placed 6/14, 6/15.  Ultimately culture grew Streptococcus intermedius.  Seen by infectious disease.  Weaned off oxygen. Repeat CT chest shows loculations.  PCCM and CT surgery recommend VATS, patient declines. Tentative removal of CT 6/27.  A & P  * Empyema (HCC) secondary to Strep intermedius w/ associated left-sided pleural effusion --repeat CT showed loculations, continue chest tube per PCCM.  CT surgery consulted.  VATS recommended.  Patient does not wish to proceed with surgery.  Continue chest tube management per pulmonology--likely out 6/27. --change abx to PO per pulmonology  Hilar mass, left pleural mass, mediastinal adenopathy thought secondary to reactive infection --continue abx, management per PCCM  Anemia -- Hemoglobin stable.  Follow periodically.  Elevated transaminase level --minimal elevation, follow periodically  Moderate malnutrition (HCC) --continue Boost Breeze once/day and [employer] once/day.  Disposition Plan:  Discussion: Currently refusing surgery.  Continue antibiotics and chest tube management.  Status is: Inpatient  Remains inpatient appropriate because:IV treatments appropriate due to intensity of illness or inability to take PO and Inpatient level of care appropriate due to severity of illness  Dispo: The patient is from: Home              Anticipated d/c is to: Home              Patient currently is not medically stable to d/c.   Difficult to place patient No  DVT prophylaxis: enoxaparin (LOVENOX) injection 40 mg Start: 06/30/20 2200   Code Status: Full Code Level of care: Telemetry Family Communication: none  Brendia Sacks, MD  [facility] Direct contact: see [email or web address] (further directions at bottom of note if needed) 7PM-7AM contact night coverage as at bottom of note 07/11/2020, 12:16 PM  LOS: 11 days    Consults:  Pulmonology   Interval History/Subjective  CC: f/u SOB  Good night last night Pain controlled Breathing fine  Review of Systems  Respiratory:  Negative for shortness of breath.    Objective   Vitals:  Vitals:   07/10/20 2104 07/11/20 0552  BP: 125/75 135/85  Pulse: 86 77  Resp: 20 20  Temp: 98.2 F (36.8 C) 97.9 F (36.6 C)  SpO2: 98% 98%    Exam: Physical Exam Vitals and nursing note reviewed.  Cardiovascular:     Rate and Rhythm: Normal rate and regular rhythm.     Heart sounds: No murmur heard. Pulmonary:     Effort: Pulmonary effort is normal. No respiratory distress.     Breath sounds: Normal breath sounds.  Neurological:     Mental Status: She is alert.  Psychiatric:        Mood and Affect: Mood normal.        Behavior: Behavior normal.    I have personally reviewed the labs and other data, making special note of:   Today's Data  CBG stable  Scheduled Meds:  amoxicillin-clavulanate  1 tablet Oral Q12H   chlorhexidine  15 mL Mouth Rinse BID   Chlorhexidine Gluconate Cloth  6 each Topical Daily   docusate  100 mg Per Tube BID   enoxaparin (LOVENOX) injection  40 mg Subcutaneous Q24H   feeding supplement  1 Container Oral Q24H   feeding supplement (KATE FARMS  STANDARD 1.4)  325 mL Oral Daily   loratadine  10 mg Oral QHS   mouth rinse  15 mL Mouth Rinse q12n4p   multivitamin with minerals  1 tablet Oral Daily   polyethylene glycol  17 g Per Tube Daily   vitamin C  250 mg Oral Daily   Continuous Infusions:  sodium chloride 250 mL (07/09/20 0938)    Principal Problem:   Empyema (HCC) secondary to Strep intermedius w/ associated left-sided pleural effusion Active Problems:   Hilar mass, left pleural mass, mediastinal adenopathy thought secondary to  reactive infection   Moderate malnutrition (HCC)   Elevated transaminase level   Anemia   Pleural effusion   LOS: 11 days   How to contact the [facility] Attending or Consulting provider 7A - 7P or covering provider during after hours 7P -7A, for this patient?  Check the care team in [facility] and look for a) attending/consulting TRH provider listed and b) the [facility] team listed Log into [email or web address] and use [place]'s universal password to access. If you do not have the password, please contact the hospital operator. Locate the [facility] provider you are looking for under [facility] and page to a number that you can be directly reached. If you still have difficulty reaching the provider, please page the [facility] (Director on Call) for the Hospitalists listed on amion for assistance.

## 2020-07-11 NOTE — Progress Notes (Signed)
Chart check   Strep milleri group empyema Has been on unasyn; switched to po amox-clav 6/25    Improved wbc previously Afebrile/hds    -would keep on iv abx; presumed this could be hematogenous spread strep milleri which is very pyogenic and lung penetration is difficult to achieve in this case with oral betalactam -ceftriaxone 2 gram daily should cover oral anaerobics too -stop amox-clav  Discussed with primary team

## 2020-07-12 LAB — GLUCOSE, CAPILLARY: Glucose-Capillary: 86 mg/dL (ref 70–99)

## 2020-07-12 MED ORDER — OXYCODONE HCL 5 MG PO TABS
5.0000 mg | ORAL_TABLET | Freq: Four times a day (QID) | ORAL | Status: DC | PRN
  Administered 2020-07-14: 5 mg via ORAL
  Filled 2020-07-12 (×2): qty 1

## 2020-07-12 NOTE — Progress Notes (Signed)
PROGRESS NOTE  Jamie Carr ZYS:[id number] DOB: [birth year] DOA: 06/30/2020 PCP: Henreitta Leber, PA-C  Brief History   65 year old woman presented with shortness of breath and left-sided chest pain, found to have complete opacification of left hemithorax showing left-sided pleural effusion on CT.  Chest tube placed 6/14, 6/15.  Ultimately culture grew Streptococcus intermedius.  Seen by infectious disease.  Weaned off oxygen. Repeat CT chest shows loculations.  PCCM and CT surgery recommend VATS, patient declines.  Purulent drainage continues.  Continue IV antibiotics per ID.  Further recommendations per pulmonology.  A & P  * Empyema (HCC) secondary to Strep intermedius w/ associated left-sided pleural effusion --repeat CT showed loculations, continue chest tube per PCCM.  CT surgery consulted.  VATS recommended.  Patient does not wish to proceed with surgery.  Continue chest tube management per pulmonology.  Still has purulent drainage.   --Continue IV antibiotics per ID.  Hilar mass, left pleural mass, mediastinal adenopathy thought secondary to reactive infection --continue abx, management per PCCM and ID.  Anemia -- Hemoglobin stable.  Follow periodically.  Elevated transaminase level --minimal elevation, follow periodically  Moderate malnutrition (HCC) --continue Boost Breeze once/day and [employer] once/day.  Disposition Plan:  Discussion: Declined surgery.  Status is: Inpatient  Remains inpatient appropriate because:IV treatments appropriate due to intensity of illness or inability to take PO and Inpatient level of care appropriate due to severity of illness  Dispo: The patient is from: Home              Anticipated d/c is to: Home              Patient currently is not medically stable to d/c.   Difficult to place patient No  DVT prophylaxis: enoxaparin (LOVENOX) injection 40 mg Start: 06/30/20 2200   Code Status: Full Code Level of care: Telemetry Family Communication:  none  Brendia Sacks, MD  [facility] Direct contact: see [email or web address] (further directions at bottom of note if needed) 7PM-7AM contact night coverage as at bottom of note 07/12/2020, 2:09 PM  LOS: 12 days    Consults:  Pulmonology   Interval History/Subjective  CC: f/u SOB  Had some pain last night, but managed Eating fine  Review of Systems  Respiratory:  Negative for shortness of breath.    Objective   Vitals:  Vitals:   07/12/20 0519 07/12/20 1229  BP: 124/76 128/74  Pulse: 87 (!) 102  Resp: 18 16  Temp: 98 F (36.7 C)   SpO2: 97% 99%    Exam: Physical Exam Vitals and nursing note reviewed.  Constitutional:      Appearance: She is not ill-appearing.  Cardiovascular:     Rate and Rhythm: Normal rate and regular rhythm.  Pulmonary:     Effort: Pulmonary effort is normal. No respiratory distress.     Breath sounds: Normal breath sounds. No wheezing.  Neurological:     Mental Status: She is alert.  Psychiatric:        Mood and Affect: Mood normal.        Behavior: Behavior normal.    I have personally reviewed the labs and other data, making special note of:   Today's Data  CBG remains stable  Scheduled Meds:  chlorhexidine  15 mL Mouth Rinse BID   Chlorhexidine Gluconate Cloth  6 each Topical Daily   docusate  100 mg Per Tube BID   enoxaparin (LOVENOX) injection  40 mg Subcutaneous Q24H   feeding supplement  1 Container Oral Q24H  feeding supplement (KATE FARMS STANDARD 1.4)  325 mL Oral Daily   loratadine  10 mg Oral QHS   mouth rinse  15 mL Mouth Rinse q12n4p   multivitamin with minerals  1 tablet Oral Daily   polyethylene glycol  17 g Per Tube Daily   vitamin C  250 mg Oral Daily   Continuous Infusions:  sodium chloride 250 mL (07/09/20 0938)   cefTRIAXone (ROCEPHIN)  IV 2 g (07/11/20 1716)    Principal Problem:   Empyema (HCC) secondary to Strep intermedius w/ associated left-sided pleural effusion Active Problems:   Hilar mass,  left pleural mass, mediastinal adenopathy thought secondary to reactive infection   Moderate malnutrition (HCC)   Elevated transaminase level   Anemia   Pleural effusion   LOS: 12 days   How to contact the [facility] Attending or Consulting provider 7A - 7P or covering provider during after hours 7P -7A, for this patient?  Check the care team in [facility] and look for a) attending/consulting TRH provider listed and b) the [facility] team listed Log into [email or web address] and use [place]'s universal password to access. If you do not have the password, please contact the hospital operator. Locate the [facility] provider you are looking for under [facility] and page to a number that you can be directly reached. If you still have difficulty reaching the provider, please page the [facility] (Director on Call) for the Hospitalists listed on amion for assistance.

## 2020-07-13 ENCOUNTER — Inpatient Hospital Stay: Payer: Self-pay

## 2020-07-13 ENCOUNTER — Inpatient Hospital Stay (HOSPITAL_COMMUNITY): Payer: BC Managed Care – PPO

## 2020-07-13 DIAGNOSIS — Z4682 Encounter for fitting and adjustment of non-vascular catheter: Secondary | ICD-10-CM

## 2020-07-13 LAB — COMPREHENSIVE METABOLIC PANEL
ALT: 34 U/L (ref 0–44)
AST: 20 U/L (ref 15–41)
Albumin: 2.8 g/dL — ABNORMAL LOW (ref 3.5–5.0)
Alkaline Phosphatase: 91 U/L (ref 38–126)
Anion gap: 10 (ref 5–15)
BUN: 9 mg/dL (ref 8–23)
CO2: 26 mmol/L (ref 22–32)
Calcium: 9 mg/dL (ref 8.9–10.3)
Chloride: 106 mmol/L (ref 98–111)
Creatinine, Ser: 0.64 mg/dL (ref 0.44–1.00)
GFR, Estimated: >60 mL/min (ref >60)
Glucose, Bld: 82 mg/dL (ref 70–99)
Potassium: 3.5 mmol/L (ref 3.5–5.1)
Sodium: 142 mmol/L (ref 135–145)
Total Bilirubin: 0.5 mg/dL (ref 0.3–1.2)
Total Protein: 6.8 g/dL (ref 6.5–8.1)

## 2020-07-13 LAB — GLUCOSE, CAPILLARY: Glucose-Capillary: 81 mg/dL (ref 70–99)

## 2020-07-13 MED ORDER — SODIUM CHLORIDE 0.9 % IV SOLN
3.0000 g | Freq: Four times a day (QID) | INTRAVENOUS | Status: DC
  Administered 2020-07-13 – 2020-07-15 (×8): 3 g via INTRAVENOUS
  Filled 2020-07-13 (×2): qty 8
  Filled 2020-07-13 (×3): qty 3
  Filled 2020-07-13 (×2): qty 8
  Filled 2020-07-13 (×2): qty 3
  Filled 2020-07-13: qty 8

## 2020-07-13 NOTE — Progress Notes (Signed)
PROGRESS NOTE  Jamie Carr ZJI:[id number] DOB: 1955/06/02 DOA: 06/30/2020 PCP: Henreitta Leber, PA-C  Brief History   65 year old woman presented with shortness of breath and left-sided chest pain, found to have complete opacification of left hemithorax showing left-sided pleural effusion on CT.  Chest tube placed 6/14, 6/15.  Ultimately culture grew Streptococcus intermedius.  Seen by infectious disease.  Weaned off oxygen. Repeat CT chest showed loculations.  PCCM, ID, CT surgery and hospitalist recommend VATS, patient declines.  Plan for removal of chest tube tomorrow per critical care and then discharged home on long-term IV antibiotics.  A & P  * Empyema (HCC) secondary to Strep intermedius w/ associated left-sided pleural effusion --repeat CT showed loculations, continue chest tube per PCCM.  CT surgery consulted.  VATS recommended.  Patient does not wish to proceed with surgery.   -- Discussed with Dr. Chestine Spore.  Plan for chest tube removal tomorrow.  Continue IV antibiotics as per Dr. Amadeo Garnet.  Hilar mass, left pleural mass, mediastinal adenopathy thought secondary to reactive infection --continue abx, management per PCCM and ID.  Anemia -- Hemoglobin stable.  Follow periodically.  Elevated transaminase level --minimal elevation now resolved  Moderate malnutrition (HCC) --continue Boost Breeze once/day and [employer] once/day.  Disposition Plan:  Discussion: Declined surgery.  Status is: Inpatient  Remains inpatient appropriate because:IV treatments appropriate due to intensity of illness or inability to take PO and Inpatient level of care appropriate due to severity of illness  Dispo: The patient is from: Home              Anticipated d/c is to: Home              Patient currently is not medically stable to d/c.   Difficult to place patient No  DVT prophylaxis: enoxaparin (LOVENOX) injection 40 mg Start: 06/30/20 2200   Code Status: Full Code Level of care: Telemetry Family  Communication: none  Brendia Sacks, MD  [facility] Direct contact: see [email or web address] (further directions at bottom of note if needed) 7PM-7AM contact night coverage as at bottom of note 07/13/2020, 2:40 PM  LOS: 13 days    Consults:  Pulmonology   Interval History/Subjective  CC: f/u SOB  Managing pain ok Breathing fine Eating fine  Review of Systems  Respiratory:  Negative for shortness of breath.    Objective   Vitals:  Vitals:   07/13/20 0505 07/13/20 1257  BP: 126/75 130/70  Pulse: 81 89  Resp: 18 16  Temp: 97.8 F (36.6 C) 98.4 F (36.9 C)  SpO2: 99% 99%    Exam: Physical Exam Vitals and nursing note reviewed.  Cardiovascular:     Rate and Rhythm: Normal rate and regular rhythm.     Heart sounds: No murmur heard. Pulmonary:     Effort: Pulmonary effort is normal. No respiratory distress.     Breath sounds: Normal breath sounds.  Neurological:     Mental Status: She is alert.  Psychiatric:        Mood and Affect: Mood normal.    I have personally reviewed the labs and other data, making special note of:   Today's Data  CMP stable  Scheduled Meds:  chlorhexidine  15 mL Mouth Rinse BID   Chlorhexidine Gluconate Cloth  6 each Topical Daily   docusate  100 mg Per Tube BID   enoxaparin (LOVENOX) injection  40 mg Subcutaneous Q24H   feeding supplement  1 Container Oral Q24H   feeding supplement (KATE FARMS STANDARD 1.4)  325 mL Oral Daily   loratadine  10 mg Oral QHS   mouth rinse  15 mL Mouth Rinse q12n4p   multivitamin with minerals  1 tablet Oral Daily   polyethylene glycol  17 g Per Tube Daily   vitamin C  250 mg Oral Daily   Continuous Infusions:  sodium chloride 250 mL (07/09/20 0938)   ampicillin-sulbactam (UNASYN) IV 3 g (07/13/20 1428)    Principal Problem:   Empyema (HCC) secondary to Strep intermedius w/ associated left-sided pleural effusion Active Problems:   Hilar mass, left pleural mass, mediastinal adenopathy thought  secondary to reactive infection   Moderate malnutrition (HCC)   Elevated transaminase level   Anemia   Pleural effusion   LOS: 13 days   How to contact the [facility] Attending or Consulting provider 7A - 7P or covering provider during after hours 7P -7A, for this patient?  Check the care team in [facility] and look for a) attending/consulting TRH provider listed and b) the [facility] team listed Log into [email or web address] and use [place]'s universal password to access. If you do not have the password, please contact the hospital operator. Locate the [facility] provider you are looking for under [facility] and page to a number that you can be directly reached. If you still have difficulty reaching the provider, please page the [facility] (Director on Call) for the Hospitalists listed on amion for assistance.

## 2020-07-13 NOTE — Progress Notes (Signed)
[facility] Infectious Diseases Follow Up Note  Patient Identification: Patient Name: Jamie Carr MRN: [id number] Admit Date: 06/30/2020  6:14 AM Age: 65 y.o.Today's Date: 07/13/2020   Reason for Visit: Empyema  Principal Problem:   Empyema (HCC) secondary to Strep intermedius w/ associated left-sided pleural effusion Active Problems:   Pleural effusion   Moderate malnutrition (HCC)   Hilar mass, left pleural mass, mediastinal adenopathy thought secondary to reactive infection   Elevated transaminase level   Anemia  Antibiotics: Unasyn 6/16- 6/24, Augmentin 6/25, ceftriaxone 6/25-current    Lines/Tubes: Left-sided chest tube, external urethral catheter, PIV's  Interval Events: continues to be afebrile, no leukocytosis, switched to Augemtin then ceftriaxone over the weekend   Assessment Left lung PNA/empyema 2/2 strep intermedius. TTE negative for vegetations Previously seen hilar mass/pleural mass/mediastinal adenopathy thought to be reactive 2/2 above Left hydropneumothorax Transaminitis, improved, Hep panel and HIV negative   Recommendations Will switch back ceftriaxone to Unasyn If patient is definitely not going to have VATS, needs PICC line and IV antibiotics Duration would be at least 4 weeks, to be followed up radiographically for monitoring progress Monitor CBC and BMP Follow-up with pulmonary Will follow intermittently this week   Rest of the management as per the primary team. Thank you for the consult. Please page with pertinent questions or concerns.  ______________________________________________________________________ Subjective patient seen and examined at the bedside.  She had 1 episode of pain at her left chest tube site over the weekend.  Otherwise no complaints  Vitals BP 126/75 (BP Location: Left Arm)   Pulse 81   Temp 97.8 F (36.6 C) (Oral)   Resp 18   Ht 5\' 2"  (1.575 m)   Wt 58 kg   SpO2  99%   BMI 23.39 kg/m     Physical Exam Constitutional: Not in acute distress, sitting up in bed,     Comments:   Cardiovascular:     Rate and Rhythm: Normal rate and regular rhythm.     Heart sounds:   Pulmonary:     Effort: Pulmonary effort is normal.     Comments: Decreased air entry in the left lung.  Left-sided chest tube with minimal fluid  Abdominal:     Palpations: Abdomen is soft.     Tenderness: Nontender and nondistended  Musculoskeletal:        General: No swelling or tenderness.   Skin:    Comments: No lesions or rashes  Neurological:     General: No focal deficit present.   Psychiatric:        Mood and Affect: Mood normal.   Pertinent Microbiology Results for orders placed or performed during the hospital encounter of 06/30/20  Resp Panel by RT-PCR (Flu A&B, Covid) Nasopharyngeal Swab     Status: None   Collection Time: 06/30/20  6:42 AM   Specimen: Nasopharyngeal Swab; Nasopharyngeal(NP) swabs in vial transport medium  Result Value Ref Range Status   SARS Coronavirus 2 by RT PCR NEGATIVE NEGATIVE Final    Comment: (NOTE) SARS-CoV-2 target nucleic acids are NOT DETECTED.  The SARS-CoV-2 RNA is generally detectable in upper respiratory specimens during the acute phase of infection. The lowest concentration of SARS-CoV-2 viral copies this assay can detect is 138 copies/mL. A negative result does not preclude SARS-Cov-2 infection and should not be used as the sole basis for treatment or other patient management decisions. A negative result may occur with  improper specimen collection/handling, submission of specimen other than nasopharyngeal swab, presence of viral mutation(s)  within the areas targeted by this assay, and inadequate number of viral copies(<138 copies/mL). A negative result must be combined with clinical observations, patient history, and epidemiological information. The expected result is Negative.  Fact Sheet for Patients:   BloggerCourse.com  Fact Sheet for Healthcare Providers:  SeriousBroker.it  This test is no t yet approved or cleared by the [place] FDA and  has been authorized for detection and/or diagnosis of SARS-CoV-2 by FDA under an Emergency Use Authorization (EUA). This EUA will remain  in effect (meaning this test can be used) for the duration of the COVID-19 declaration under Section 564(b)(1) of the Act, 21 U.S.C.section 360bbb-3(b)(1), unless the authorization is terminated  or revoked sooner.       Influenza A by PCR NEGATIVE NEGATIVE Final   Influenza B by PCR NEGATIVE NEGATIVE Final    Comment: (NOTE) The Xpert Xpress SARS-CoV-2/FLU/RSV plus assay is intended as an aid in the diagnosis of influenza from Nasopharyngeal swab specimens and should not be used as a sole basis for treatment. Nasal washings and aspirates are unacceptable for Xpert Xpress SARS-CoV-2/FLU/RSV testing.  Fact Sheet for Patients: BloggerCourse.com  Fact Sheet for Healthcare Providers: SeriousBroker.it  This test is not yet approved or cleared by the [place] FDA and has been authorized for detection and/or diagnosis of SARS-CoV-2 by FDA under an Emergency Use Authorization (EUA). This EUA will remain in effect (meaning this test can be used) for the duration of the COVID-19 declaration under Section 564(b)(1) of the Act, 21 U.S.C. section 360bbb-3(b)(1), unless the authorization is terminated or revoked.  Performed at [facility], [address] [place], [place] [address]   Blood culture (routine x 2)     Status: None   Collection Time: 06/30/20  7:37 AM   Specimen: Left Antecubital; Blood  Result Value Ref Range Status   Specimen Description   Final    LEFT ANTECUBITAL Performed at [facility], [address] [place], [place] [address]    Special  Requests   Final    Blood Culture adequate volume Performed at [facility], [address] [place], [place] [address]    Culture   Final    NO GROWTH 5 DAYS Performed at [facility], [address] [place], [place] [address]    Report Status 07/05/2020 FINAL  Final  Blood culture (routine x 2)     Status: None   Collection Time: 06/30/20  7:42 AM   Specimen: Right Antecubital; Blood  Result Value Ref Range Status   Specimen Description   Final    RIGHT ANTECUBITAL Performed at [facility], [address] [place], [place] [address]    Special Requests   Final    Blood Culture adequate volume Performed at [facility], [address] [place], [place] [address]    Culture   Final    NO GROWTH 5 DAYS Performed at [facility], [address], [place], [place] [address]    Report Status 07/05/2020 FINAL  Final  Body fluid culture w Gram Stain     Status: None   Collection Time: 06/30/20 10:01 AM   Specimen: Pleural Fluid  Result Value Ref Range Status   Specimen Description   Final    PLEURAL Performed at [facility], [address], [place], [place] [address]    Special Requests   Final    NONE Performed at [facility], [address], [place],  [place] [address]    Gram Stain   Final    ABUNDANT WBC PRESENT,BOTH PMN AND MONONUCLEAR ABUNDANT GRAM POSITIVE COCCI Performed at [facility], [address], [place], [place] [address]    Culture ABUNDANT STREPTOCOCCUS INTERMEDIUS  Final   Report Status 07/02/2020 FINAL  Final   Organism ID, Bacteria STREPTOCOCCUS INTERMEDIUS  Final      Susceptibility   Streptococcus intermedius - MIC*    PENICILLIN <=0.06 SENSITIVE Sensitive     CEFTRIAXONE 0.25 SENSITIVE Sensitive     ERYTHROMYCIN <=0.12 SENSITIVE Sensitive     LEVOFLOXACIN 0.5 SENSITIVE Sensitive     VANCOMYCIN 0.5 SENSITIVE Sensitive     *  ABUNDANT STREPTOCOCCUS INTERMEDIUS  MRSA PCR Screening     Status: None   Collection Time: 06/30/20  4:17 PM   Specimen: Nasopharyngeal  Result Value Ref Range Status   MRSA by PCR NEGATIVE NEGATIVE Final    Comment:        The GeneXpert MRSA Assay (FDA approved for NASAL specimens only), is one component of a comprehensive MRSA colonization surveillance program. It is not intended to diagnose MRSA infection nor to guide or monitor treatment for MRSA infections. Performed at [facility], [address], [place], [place] [address]   Body fluid culture w Gram Stain     Status: None   Collection Time: 07/01/20 12:30 PM   Specimen: Pleura; Body Fluid  Result Value Ref Range Status   Specimen Description   Final    PLEURAL LT Performed at [facility], [address] [place], [place] [address]    Special Requests   Final    NONE Performed at [facility], [address], [place], [place] [address]    Gram Stain   Final    ABUNDANT WBC PRESENT,BOTH PMN AND MONONUCLEAR ABUNDANT GRAM POSITIVE COCCI    Culture   Final    ABUNDANT STREPTOCOCCUS INTERMEDIUS SUSCEPTIBILITIES PERFORMED ON PREVIOUS CULTURE WITHIN THE LAST 5 DAYS. Performed at [facility], [address], [place], [place] [address]    Report Status 07/03/2020 FINAL  Final  Aerobic/Anaerobic Culture w Gram Stain (surgical/deep wound)     Status: None   Collection Time: 07/03/20  4:14 PM   Specimen: Abscess  Result Value Ref Range Status   Specimen Description   Final    ABSCESS Performed at [facility], [address] [place], [place] [address]    Special Requests   Final    Normal Performed at [facility], [address], [place], [place] [address]    Gram Stain   Final    ABUNDANT WBC PRESENT,BOTH PMN AND MONONUCLEAR MODERATE GRAM POSITIVE COCCI    Culture   Final    No growth aerobically or  anaerobically. Performed at [facility], [address], [place], [place] [address]    Report Status 07/08/2020 FINAL  Final   Pertinent Lab. CBC Latest Ref Rng & Units 07/10/2020 07/07/2020 07/06/2020  WBC 4.0 - 10.5 K/uL 10.8(H) 9.5 13.1(H)  Hemoglobin 12.0 - 15.0 g/dL 2.4(M) 0.1(U) 2.7(O)  Hematocrit 36.0 - 46.0 % 27.9(L) 25.7(L) 29.6(L)  Platelets 150 - 400 K/uL 531(H) 511(H) 722(H)   CMP Latest Ref Rng & Units 07/13/2020 07/10/2020 07/07/2020  Glucose 70 - 99 mg/dL 82 88 92  BUN 8 - 23 mg/dL 9 8 6(L)  Creatinine 5.36 - 1.00 mg/dL 6.44 0.34 7.42  Sodium 135 - 145  mmol/L 142 136 137  Potassium 3.5 - 5.1 mmol/L 3.5 2.9(L) 3.3(L)  Chloride 98 - 111 mmol/L 106 101 102  CO2 22 - 32 mmol/L 26 24 27   Calcium 8.9 - 10.3 mg/dL 9.0 1.6(X8.6(L) 0.9(U8.6(L)  Total Protein 6.5 - 8.1 g/dL 6.8 5.9(L) 5.4(L)  Total Bilirubin 0.3 - 1.2 mg/dL 0.5 0.5 0.3  Alkaline Phos 38 - 126 U/L 91 92 93  AST 15 - 41 U/L 20 57(H) 68(H)  ALT 0 - 44 U/L 34 56(H) 67(H)     Pertinent Imaging today Plain films and CT images have been personally visualized and interpreted; radiology reports have been reviewed. Decision making incorporated into the Impression / Recommendations.  Left chest Xray 07/12/20 FINDINGS: Large bore left chest tube is unchanged overlying the left mid lung zone. Pleural thickening and loculated left hydropneumothorax are unchanged. Marked left-sided volume loss is again noted, unchanged. Right lung is clear. No pneumothorax or pleural effusion on the right. Cardiac size is within normal limits. Pulmonary vascularity is normal. Mild thoracic dextroscoliosis noted.  IMPRESSION: Stable moderate left hydropneumothorax with pleural thickening suggesting a pneumothorax ex vacuo. Large bore left chest tube unchanged. 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   I have spent more than 35 minutes for this patient encounter including review of prior medical records,  coordination of care  with greater than 50% of time being face to face/counseling and discussing diagnostics/treatment plan with the patient/family.  Electronically signed by:   Odette FractionSabina Zale Marcotte, MD Infectious Disease Physician [facility] Pager: [phone]

## 2020-07-13 NOTE — TOC Progression Note (Signed)
Transition of Care [facility]) - Progression Note    Patient Details  Name: Jamie Carr MRN: [id number] Date of Birth: 10/09/1955  Transition of Care [facility] CM/SW Contact  Farheen Pfahler, Olegario Messier, RN Phone Number: 07/13/2020, 2:51 PM  Clinical Narrative:  Noed may need home iv abx-[employer] rep Pam following if needed.     Expected Discharge Plan: Home/Self Care Barriers to Discharge: Continued Medical Work up  Expected Discharge Plan and Services Expected Discharge Plan: Home/Self Care   Discharge Planning Services: CM Consult   Living arrangements for the past 2 months: Single Family Home                                       Social Determinants of Health (SDOH) Interventions    Readmission Risk Interventions No flowsheet data found.

## 2020-07-13 NOTE — Progress Notes (Signed)
NAME:  Tinsleigh Slovacek MRN:  [id number] DOB:  1955-06-16 LOS: 13 ADMISSION DATE:  06/30/2020 CONSULTATION DATE:  06/30/2020 REFERRING MD: Dr. Wilkie Aye - EDP CHIEF COMPLAINT:  SOB, Pleural effusion   History of Present Illness:  65 year old female with PMHx significant for epilepsy, thyroid disease and tremors who presented to [facility] ED with complaints of dyspnea and left-sided chest pain x 2-3 weeks (worse with deep breathing and coughing, radiating to L shoulder). Dyspnea was progressive prompting her to seek medical attention 6/14.   In the ED, patient was tachypneic but no hypoxia was observed. CXR demonstrated complete opacification of the left hemithorax suggestive of large pleural effusion. CT Chest obtained to further characterize effusion showed large left-sided pleural effusion as well as concern for tumor at the left hilum and a pleural based mass.   PCCM asked to consult for effusion management.   Pertinent Medical History:  Epilepsy, thyroid disease, tremor, myopia, presbyopia  Significant Hospital Events:  6/14 - Admit to [facility], transfer to [facility], [facility] consult for management of effusion, CT placed by EDP 6/15 - Increased pain at CT insertion site, CXR demonstrating possible kink at insertion site/L chest wall. Large-bore CT placed at bedside (Dr. Katrinka Blazing). Episode of respiratory distress with hypotension post-CT placement, thought to be med effect versus reexpansion pulm edema. Improved with Fentanyl/Precedex, weaned off of Precedex. BPs improved with fluid resuscitation.  Culture ultimately grew Streptococcus intermedius [LDH greater than 10,000].  No malignant cells 6/16 - Improved CT drainage overnight, stable O2 needs. CXR with marginally increased PTX, decreased effusion, asymmetrical pulmonary edema on L. Transferred to floor.  Was able to come off oxygen 6/17 - Improved respiratory status and O2 needs. CT Chest demonstrating 3.5 x 4cm loculated collection along with pneumothorax  ex vacuo vs. Mass in the abdominal wall ->  IR to attempt aspiration today -> no growth as of 6/19.  Noticed to be on 2 L nasal cannula which she says was [place]fort.  Infectious disease consultation recommending 4 weeks of antibiotics and continue Unasyn for now. 07/04/2020: [facility] hospitalist conversation with thoracic surgery: No role for VATS.  Recommend bronchoscopy consideration for mediastinal nodes. 6/20 No acute events overnight and patient is now on RA and feels well. Large bore CT remains in place and drained in the last 24hrs. PTX slight worse on CXR 6/21 no recorded chest tube output overnight  Repeat chest CT with trapped lung, chest tube remains in position with minimal fluid in the pleural space. Resolving lung opacification. 6/25 refused chest tube removal  Interim History / Subjective:  Denies complaints with her chest tube. No chest tube output recorded since the 25th.  Objective:  Blood pressure 126/75, pulse 81, temperature 97.8 F (36.6 C), temperature source Oral, resp. rate 18, height 5\' 2"  (1.575 m), weight 58 kg, SpO2 99 %.       No intake or output data in the 24 hours ending 07/13/20 1248  Filed Weights   06/30/20 0625 07/02/20 1812  Weight: 55.3 kg 58 kg    Physical Exam General:  thin, frail appearing woman sitting up in the chair in NAD HEENT: [place]/AT, eyes anicteric Cardiac: S1S2, RRR Resp: absent left sided breath sounds, CTA on R. Chest tube with minimal drainage- thick, white. Atrium with uneven fluid levels in all 3 colums, but overall minimal drainage since atrium changed 6/21. No air leak. Abd: ND Extremities: no clubbing or cyanosis, no significant edema Neuro: Awake, alert, answering questions appropriately. Speech normal. Moving  all extremities spontaneously. Derm: skin warm, dry. No rashes. Dressing around chest tube.  Labs/imaging that I have personally reviewed:  CXR 6/16 Chest tube on left, unchanged, with left-sided pneumothorax,  marginally larger. Airspace opacity consistent with pneumonia left base. Asymmetric pulmonary edema on the left is stable. CXR 6/17 Empyema with stable left chest tube. Poor re-expansion of the left lung. Small volume left pneumothorax/pleural gas superimposed on diffuse pleural thickening. CT Chest 6/17 Left lateral approach thoracostomy tube with tip located in the major fissure, with a small moderate left pneumothorax. There is extensive nodular thickening of the parietal and visceral pleura throughout the left hemithorax with incomplete re-expansion of the left lung suggestive of the entrapment. Mass like area along the left anterior abdominal measuring 4 cm which is favored to represent loculated fluid along the costophrenic angle as opposed to a discrete chest wall mass. Multifocal consolidations, nodularity and ground-glass opacities with septal thickening throughout the left lung.  Lymphadenopathy Pleural Fluid Cx 6/16 - abundant WBC, abundant gram positive cocci -> strep intermedius Pleural Fluid LDH 6/16 - >10,000 Pleural Fluid Glucose 6/16 - 43 Pleural Fluid Cytology 6/15 - No malignant cells identified, acute inflammation 6/22 Chest CT >> persistent thick-walled left-sided air-fluid collection with pleural thickening, pleural adhesions at the left base.  Chest tube is in place posteriorly.  Residual left apical fluid collection with an air-fluid level slightly increased.  Improving left-sided pneumonia.  Resolved Hospital Problem List:   Acute hypoxic respiratory failure  Assessment & Plan:  Streptococcus intermedius empyema-present on admission Large left-sided pleural effusion-present on admission -Per ID needs PICC and home unasyn. -pain management for chest tube- per primary. Has tylenol, oxycodone, and fentanyl ordered. -discussed that VATS with decortication is her best option for infection resolution and avoiding long-term respiratory impairment. 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 Since she has opted for medical management, prolonged course of antibiotics is appropriate. I recommend if her chest tube output has significantly slowed that it should be removed soon to avoid ongoing potential for complications (namely skin infection related to the tube and potential for this to seed the pleural space). She is not amenable to removing it today and we decided that at 1pm tomorrow we will plan to do it. She understands that due to other patient's acuity, this may not be feasible for me to keep exactly, but I will try.  -After chest tube is removed she needs a thick, fully occlusive dressing. She will have a small amount of ongoing drainage. This may take a while to heal due to ongoing pleural inflammation, but hopefully will close within a few days.  No showers unless the dressing is occlusive and can remain dry (large tegaderm over entire dressing and sealing on the sides). Can clean the skin in that area with alcohol. -She cannot submerge in water, including baths, until skin has closed entirely.  Hilar/mediastinal adenopathy -Likely reactive -Needs to be followed closely -Unfortunately, may be an underlying neoplastic process  Noted previous conversations with other providers, including multidisciplinary meeting with Dr. Delton Coombes, Dr. Cliffton Asters, Dr. Irene Limbo regarding need for surgical intervention. Her wishes to proceed with non-op management are being respected. Barriers to discharge are PICC placement and chest tube removal. Anticipate discharge in the coming days. Discussed with primary.    Steffanie Dunn, DO 07/13/20 1:39 PM [facility]

## 2020-07-14 ENCOUNTER — Telehealth: Payer: Self-pay | Admitting: Critical Care Medicine

## 2020-07-14 LAB — GLUCOSE, CAPILLARY: Glucose-Capillary: 119 mg/dL — ABNORMAL HIGH (ref 70–99)

## 2020-07-14 LAB — CREATININE, SERUM
Creatinine, Ser: 0.81 mg/dL (ref 0.44–1.00)
GFR, Estimated: >60 mL/min (ref >60)

## 2020-07-14 MED ORDER — SODIUM CHLORIDE 0.9% FLUSH: 10.0000 mL | INTRAVENOUS | Status: DC | PRN

## 2020-07-14 MED ORDER — SODIUM CHLORIDE 0.9% FLUSH
10.0000 mL | Freq: Two times a day (BID) | INTRAVENOUS | Status: DC
  Administered 2020-07-14: 10 mL

## 2020-07-14 NOTE — Telephone Encounter (Signed)
Patient is scheduled with Dr. Delton Coombes on 08/05/20. Nothing further needed.

## 2020-07-14 NOTE — Progress Notes (Signed)
Chest tube removed uneventfully. 3/10 pain, she does not  feel that she needs additional pain meds at this time.  Dressed with folded gauze and occlusive tegaderm. Recommend dressing changes with 2 pieces of gauze folded with occlusive tegaderm twice daily. If she has a fully occlusive dressing, she can shower. May need to change dressing more often if it is soaked.  Until skin incision completely heals, no submerging in water (baths, pools, hot tubs, etc).   PCCM will sign off. Can follow up in the office in the next few weeks and she has our number to call in the itnterim if there are issues.  Steffanie Dunn, DO 07/14/20 1:40 PM [facility]

## 2020-07-14 NOTE — Progress Notes (Signed)
Nutrition Follow-up  DOCUMENTATION CODES:   Non-severe (moderate) malnutrition in context of acute illness/injury  INTERVENTION:  - continue Boost Breeze once/day and [employer] 1.4 oral once/day. - weigh patient today.    NUTRITION DIAGNOSIS:   Moderate Malnutrition related to acute illness as evidenced by mild fat depletion, mild muscle depletion. -ongoing  GOAL:   Patient will meet greater than or equal to 90% of their needs -minimally met on average  MONITOR:   PO intake, Supplement acceptance, Labs, Weight trends  ASSESSMENT:   65 year old female with medical history of prior thyroid disease, tremors, and epilepsy. She presented to the ED due to 2-3 week hx of dyspnea, cough, and L-sided chest pain which have progressively worsened over time. In the ED she was noted to be tachypneic and CXR and CT chest showed complete opacification of L hemopneumothorax and showed a large L pleural effusion with concern for tumor.  She has been mainly been eating 75-100% at meals over the past week. Most meals have been on the small side. She ate 100% of breakfast this AM which consisted of a banana, 2 slices of bacon, and a cup of hot tea. This meal provided 200 kcal and 6 grams protein.  She has been accepting Boost Breeze and [employer] 1.4 ~90% of the time offered. If she consumes 100% of these supplements each time she receives them, she is consuming 705 kcal and 29 grams protein/day from oral nutrition supplements.  Patient laying in bed. Visit was brief d/t IV team arriving to place PICC.   Patient reports that since shortly after admission appetite has returned to baseline. She feels that she is eating as she normally does at baseline and denies abdominal discomfort with PO intakes. She has had family/friends bring in non-dairy yogurt for her (she eats dairy-free and gluten-free) but has not had them bring in any other items. She reports not missing gluten-free bread.   She has not  been weighed since 6/16.   Per notes: - empyema - VATS recommended by CT Surgery but patient has declined - hilar mass, L pleural mass, mediastinal adenopathy--abx ordered, plan for outpatient follow-up - likely d/c home on 6/29   Labs reviewed; CBG: 119 mg/dl. Medications reviewed; 100 mg colace BID, 1 tablet multivitamin with minerals/day, 17 g miralax/day, 250 mg ascorbic acid/day.    Diet Order:   Diet Order             Diet regular Room service appropriate? Yes; Fluid consistency: Thin  Diet effective now                   EDUCATION NEEDS:   No education needs have been identified at this time  Skin:  Skin Assessment: Reviewed RN Assessment  Last BM:  6/28 (type 4 x1)  Height:   Ht Readings from Last 1 Encounters:  06/30/20 5' 2" (1.575 m)    Weight:   Wt Readings from Last 1 Encounters:  07/02/20 58 kg     Estimated Nutritional Needs:  Kcal:  1700-1900 kcal Protein:  85-100 grams Fluid:  >/= 2 L/day     Jarome Matin, MS, RD, LDN, CNSC Inpatient Clinical Dietitian RD pager # available in AMION  After hours/weekend pager # available in [facility]

## 2020-07-14 NOTE — Progress Notes (Signed)
PHARMACY CONSULT NOTE FOR:  OUTPATIENT  PARENTERAL ANTIBIOTIC THERAPY (OPAT)  Indication: Empyema Regimen: ceftriaxone 2g IV q24h AND PO Flagyl 500mg  BID End date: 08/10/2020  IV antibiotic discharge orders are pended. To discharging provider:  please sign these orders via discharge navigator,  Select New Orders & click on the button choice - Manage This Unsigned Work.     Thank you for allowing pharmacy to be a part of this patient's care.  08/12/2020 07/14/2020, 8:49 AM

## 2020-07-14 NOTE — Progress Notes (Signed)
ID Brief Note  Patient is refusing VATS and surgical intervention by CVTS which pushes [place] for PICC line and IV antibiotics. I would prefer IV antibiotics compared to PO antibiotics in her situation and I have discussed that with her.   OK to place a PICC line All cultures finalized no new updates  Plan for removal of CT today by PCCM noted  Will plan for 4 weeks of IV antibiotics and arrange a follow up in the clinic ( IV Unasyn in the hospital and ceftriaxone/fMetronidazole when discharged for ease of dosing) She will need to be followed up with Pulmonary as well  Will place OPAT orders ID will sign off for now. Please call with questions  Odette Fraction, MD Infectious Disease Physician [facility] [address] [place], [place] [address] Phone: [phone]  Fax: [phone]

## 2020-07-14 NOTE — Progress Notes (Signed)
PROGRESS NOTE  Jamie Carr FIE:[id number] DOB: Sep 14, 1955 DOA: 06/30/2020 PCP: Henreitta Leber, PA-C  Brief History   65 year old woman presented with shortness of breath and left-sided chest pain, found to have complete opacification of left hemithorax showing left-sided pleural effusion on CT.  Chest tube placed 6/14, 6/15.  Ultimately culture grew Streptococcus intermedius.  Seen by infectious disease.  Weaned off oxygen. Repeat CT chest showed loculations.  PCCM, ID, CT surgery and hospitalist recommend VATS, patient declines.  Chest tube removed today. Await PICC line placement and plan discharge home 6/29 on IV abx with outpatient f/u with pulmonology and ID.  A & P  * Empyema (HCC) secondary to Strep intermedius w/ associated left-sided pleural effusion --repeat CT showed loculations.  CT surgery consulted.  VATS recommended.  Patient does not wish to proceed with surgery.   --Chest tube out 6/28. Home tomorrow on IV abx.  Hilar mass, left pleural mass, mediastinal adenopathy thought secondary to reactive infection --continue abx, follow-up as outpatient with pulmonology and ID.  Anemia -- Hemoglobin stable.  Follow periodically.  Elevated transaminase level --resolved  Moderate malnutrition (HCC) --continue Boost Breeze once/day and [employer] once/day.  Disposition Plan:  Discussion: anticipate home 6/29.  Status is: Inpatient  Remains inpatient appropriate because:IV treatments appropriate due to intensity of illness or inability to take PO and Inpatient level of care appropriate due to severity of illness  Dispo: The patient is from: Home              Anticipated d/c is to: Home              Patient currently is not medically stable to d/c.   Difficult to place patient No  DVT prophylaxis: enoxaparin (LOVENOX) injection 40 mg Start: 06/30/20 2200   Code Status: Full Code Level of care: Telemetry Family Communication: none  Brendia Sacks, MD  [facility] Direct contact: see [email or web address] (further directions at bottom of note if needed) 7PM-7AM contact night coverage as at bottom of note 07/14/2020, 2:24 PM  LOS: 14 days    Consults:  Pulmonology   Interval History/Subjective  CC: f/u SOB  Feeling ok Pain fairly well-controlled  Review of Systems  Respiratory:  Negative for shortness of breath.    Objective   Vitals:  Vitals:   07/14/20 0532 07/14/20 1207  BP: 122/73 126/74  Pulse: 88 79  Resp: 16 16  Temp: 97.8 F (36.6 C) 98 F (36.7 C)  SpO2: 95% 98%    Exam: Physical Exam Vitals and nursing note reviewed.  Cardiovascular:     Rate and Rhythm: Normal rate and regular rhythm.     Heart sounds: No murmur heard. Pulmonary:     Effort: Pulmonary effort is normal. No respiratory distress.     Breath sounds: Normal breath sounds.  Neurological:     Mental Status: She is alert.  Psychiatric:        Mood and Affect: Mood normal.        Behavior: Behavior normal.    I have personally reviewed the labs and other data, making special note of:   Today's Data  Creatinine stable  Scheduled Meds:  chlorhexidine  15 mL Mouth Rinse BID   Chlorhexidine Gluconate Cloth  6 each Topical Daily   docusate  100 mg Per Tube BID   enoxaparin (LOVENOX) injection  40 mg Subcutaneous Q24H   feeding supplement  1 Container Oral Q24H   feeding supplement (KATE FARMS STANDARD 1.4)  325 mL  Oral Daily   loratadine  10 mg Oral QHS   mouth rinse  15 mL Mouth Rinse q12n4p   multivitamin with minerals  1 tablet Oral Daily   polyethylene glycol  17 g Per Tube Daily   vitamin C  250 mg Oral Daily   Continuous Infusions:  sodium chloride 250 mL (07/09/20 0938)   ampicillin-sulbactam (UNASYN) IV 3 g (07/14/20 1336)    Principal Problem:   Empyema (HCC) secondary to Strep intermedius w/ associated left-sided pleural effusion Active Problems:   Hilar mass, left pleural mass, mediastinal adenopathy thought secondary to reactive  infection   Moderate malnutrition (HCC)   Elevated transaminase level   Anemia   Pleural effusion   LOS: 14 days   How to contact the [facility] Attending or Consulting provider 7A - 7P or covering provider during after hours 7P -7A, for this patient?  Check the care team in [facility] and look for a) attending/consulting TRH provider listed and b) the [facility] team listed Log into [email or web address] and use [facility] universal password to access. If you do not have the password, please contact the hospital operator. Locate the [facility] provider you are looking for under [facility] and page to a number that you can be directly reached. If you still have difficulty reaching the provider, please page the [facility] (Director on Call) for the Hospitalists listed on amion for assistance.

## 2020-07-14 NOTE — Progress Notes (Signed)
Diagnosis: empyema   Culture Result: Streptococcus intermedius   Allergies  Allergen Reactions   Eggs Or Egg-Derived Products     Pt    Sulfa Antibiotics    Wheat Bran     OPAT Orders Discharge antibiotics to be given via PICC line Discharge antibiotics: ceftriaxone 2 g iv daily  Per pharmacy protocol Aim for Vancomycin trough 15-20 or AUC 400-550 (unless otherwise indicated) Duration: 4 weeks  End Date: 08/10/20  PIC Care Per Protocol:  Home health RN for IV administration and teaching; PICC line care and labs.    Labs weekly while on IV antibiotics: _X_ CBC with differential __ BMP _X_ CMP __ CRP __ ESR __ Vancomycin trough __ CK  __ Please pull PIC at completion of IV antibiotics _X Please leave PIC in place until doctor has seen patient or been notified  Fax weekly labs to [phone]  Clinic Follow Up Appt: 7/15  @ 3: 15 pm  Sabina Manandhar, MD Infectious Disease Physician [place]  [facility] [address] [place], [place] [address] Phone: [phone]  Fax: [phone]   

## 2020-07-14 NOTE — Progress Notes (Signed)
Peripherally Inserted Central Catheter Placement  The IV Nurse has discussed with the patient and/or persons authorized to consent for the patient, the purpose of this procedure and the potential benefits and risks involved with this procedure.  The benefits include less needle sticks, lab draws from the catheter, and the patient may be discharged home with the catheter. Risks include, but not limited to, infection, bleeding, blood clot (thrombus formation), and puncture of an artery; nerve damage and irregular heartbeat and possibility to perform a PICC exchange if needed/ordered by physician.  Alternatives to this procedure were also discussed.  Bard Power PICC patient education guide, fact sheet on infection prevention and patient information card has been provided to patient /or left at bedside.    PICC Placement Documentation  PICC Single Lumen 07/14/20 Right Basilic 35 cm 0 cm (Active)  Indication for Insertion or Continuance of Line Home intravenous therapies (PICC only) 07/10/20 1958  Exposed Catheter (cm) 0 cm 07/10/20 1958  Site Assessment Clean;Dry;Intact 07/10/20 1958  Line Status Flushed;Saline locked;Blood return noted 07/10/20 1958  Dressing Type Transparent;Securing device 07/10/20 1958  Dressing Status Clean;Dry;Intact 07/10/20 1958  Antimicrobial disc in place? Yes 07/10/20 1958  Safety Lock Not Applicable 07/10/20 1958  Line Care Connections checked and tightened 07/10/20 1958  Dressing Intervention New dressing 07/10/20 1958  Dressing Change Due 07/21/20 07/10/20 1958       Timmothy Sours 07/14/2020, 2:51 PM

## 2020-07-14 NOTE — Telephone Encounter (Signed)
Please schedule follow up in about 2 weeks. Thanks!  Steffanie Dunn, DO 07/14/20 1:45 PM [facility]

## 2020-07-15 DIAGNOSIS — E876 Hypokalemia: Secondary | ICD-10-CM

## 2020-07-15 LAB — BASIC METABOLIC PANEL
Anion gap: 11 (ref 5–15)
BUN: 7 mg/dL — ABNORMAL LOW (ref 8–23)
CO2: 26 mmol/L (ref 22–32)
Calcium: 9 mg/dL (ref 8.9–10.3)
Chloride: 104 mmol/L (ref 98–111)
Creatinine, Ser: 0.65 mg/dL (ref 0.44–1.00)
GFR, Estimated: >60 mL/min (ref >60)
Glucose, Bld: 141 mg/dL — ABNORMAL HIGH (ref 70–99)
Potassium: 2.7 mmol/L — CL (ref 3.5–5.1)
Sodium: 141 mmol/L (ref 135–145)

## 2020-07-15 LAB — GLUCOSE, CAPILLARY: Glucose-Capillary: 89 mg/dL (ref 70–99)

## 2020-07-15 LAB — POTASSIUM: Potassium: 4.2 mmol/L (ref 3.5–5.1)

## 2020-07-15 LAB — MAGNESIUM: Magnesium: 2.1 mg/dL (ref 1.7–2.4)

## 2020-07-15 MED ORDER — HEPARIN SOD (PORK) LOCK FLUSH 100 UNIT/ML IV SOLN
250.0000 [IU] | INTRAVENOUS | Status: AC | PRN
  Administered 2020-07-15: 250 [IU]
  Filled 2020-07-15: qty 2.5

## 2020-07-15 MED ORDER — METRONIDAZOLE 500 MG PO TABS
500.0000 mg | ORAL_TABLET | Freq: Two times a day (BID) | ORAL | Status: DC
  Administered 2020-07-15: 500 mg via ORAL
  Filled 2020-07-15: qty 1

## 2020-07-15 MED ORDER — POTASSIUM CHLORIDE 10 MEQ/100ML IV SOLN
10.0000 meq | INTRAVENOUS | Status: AC
  Administered 2020-07-15 (×4): 10 meq via INTRAVENOUS
  Filled 2020-07-15 (×4): qty 100

## 2020-07-15 MED ORDER — SODIUM CHLORIDE 0.9 % IV SOLN
2.0000 g | INTRAVENOUS | Status: DC
  Administered 2020-07-15: 2 g via INTRAVENOUS
  Filled 2020-07-15: qty 2

## 2020-07-15 MED ORDER — METRONIDAZOLE 500 MG PO TABS: 500.0000 mg | ORAL_TABLET | Freq: Two times a day (BID) | ORAL | 0 refills | Status: AC

## 2020-07-15 MED ORDER — CEFTRIAXONE IV (FOR PTA / DISCHARGE USE ONLY): 2.0000 g | INTRAVENOUS | 0 refills | Status: AC

## 2020-07-15 MED ORDER — POTASSIUM CHLORIDE CRYS ER 20 MEQ PO TBCR
40.0000 meq | EXTENDED_RELEASE_TABLET | ORAL | Status: AC
  Administered 2020-07-15 (×2): 40 meq via ORAL
  Filled 2020-07-15 (×2): qty 2

## 2020-07-15 NOTE — Progress Notes (Signed)
    BRIEF OVERNIGHT PROGRESS REPORT  Called by Rn for potential runs of tachycardia.  Upon review of telemetry it appears to be several short runs of tachycardia , also the RN states that during one of these the patient did have her hand resting on the leads. She has obvious and known tremors in her hands/arms. Labs for electrolyte evaluation ordered.   Chinita Greenland MSNA ACNPC-AG Acute Care Nurse Practitioner [facility]

## 2020-07-15 NOTE — Progress Notes (Deleted)
.  jd

## 2020-07-15 NOTE — TOC Progression Note (Signed)
Transition of Care [facility]) - Progression Note    Patient Details  Name: Jamie Carr MRN: [id number] Date of Birth: [birth year]  Transition of Care [facility]) CM/SW Contact  Gisel Vipond, Olegario Messier, RN Phone Number: 07/15/2020, 10:46 AM  Clinical Narrative: [facility] rep [facility] following for HHRN-iv abx instruction;[facility] [employer] rep following for initial instruction-iv abx;supplies. Will confirm pcp. Has own transport home. Attempting to reach Michael(spouse) [phone]-non functioning tel# unable to leave message.      Expected Discharge Plan: Home w Home Health Services Barriers to Discharge: Continued Medical Work up  Expected Discharge Plan and Services Expected Discharge Plan: Home w Home Health Services   Discharge Planning Services: CM Consult Post Acute Care Choice: Home Health Living arrangements for the past 2 months: Single Family Home                           HH Arranged: RN, IV Antibiotics HH Agency: [facility], [facility] Date [facility] Agency Contacted: 07/15/20 Time HH Agency Contacted: 1040 Representative spoke with at [facility] Agency: Pam[facility]-iv infusion;[facility]-rep [facility]-iv abx instruction.   Social Determinants of Health (SDOH) Interventions    Readmission Risk Interventions No flowsheet data found.

## 2020-07-15 NOTE — Plan of Care (Signed)
  Problem: Activity: Goal: Risk for activity intolerance will decrease Outcome: Completed/Met   Problem: Pain Managment: Goal: General experience of comfort will improve Outcome: Completed/Met   

## 2020-07-15 NOTE — Progress Notes (Signed)
Reviewed dc instructions including prescriptions and med list with pt. Pt verbalized understanding. Pt discharged to home with PICC line in place for home antibiotics per orders. Krayton Wortley, Yancey Flemings, RN

## 2020-07-15 NOTE — Discharge Summary (Signed)
Physician Discharge Summary  Jamie Carr ZMO:[id number] DOB: 06/14/1955 DOA: 06/30/2020  PCP: Earnstine Regal, PA-C  Admit date: 06/30/2020 Discharge date: 07/15/2020  Admitted From: Home Disposition:  Home   Recommendations for Outpatient Follow-up:  Follow up with PCP in 1 week Follow up with ID as scheduled on 7/15 Follow-up with pulmonology as scheduled on 7/20, 8/3  Discharge Condition: Stable CODE STATUS: Full  Diet recommendation: Regular diet   Brief/Interim Summary: Jamie Carr is a 65 year old woman presented with shortness of breath and left-sided chest pain, found to have complete opacification of left hemithorax showing left-sided pleural effusion on CT.  Chest tube placed 6/14, 6/15.  Ultimately culture grew Streptococcus intermedius.  Seen by infectious disease.  Weaned off oxygen. Repeat CT chest showed loculations.  PCCM, ID, CT surgery and hospitalist recommend VATS, patient declines.  Chest tube removed. PICC line placed and patient discharged with IV antibiotics with outpatient follow-up.  She will continue Rocephin through 08/10/2020.  Potassium was repleted prior to discharge home.  Discharge Diagnoses:  Principal Problem:   Empyema ([place]) secondary to Strep intermedius w/ associated left-sided pleural effusion Active Problems:   Pleural effusion   Moderate malnutrition (HCC)   Hilar mass, left pleural mass, mediastinal adenopathy thought secondary to reactive infection   Elevated transaminase level   Anemia   Hypokalemia    Discharge Instructions  Discharge Instructions     [facility] pharmacist to adjust dose for Vancomycin, Aminoglycosides and other anti-infective therapies as requested by physician.   Complete by: As directed    [facility] to provide Cath Flo 72m   Complete by: As directed    Administer for PICC line occlusion and as ordered by physician for other access device issues.   Anaphylaxis Kit: Provided to treat any  anaphylactic reaction to the medication being provided to the patient if First Dose or when requested by physician   Complete by: As directed    Epinephrine 1529mml vial / amp: Administer 0.29m42m0.29ml90mubcutaneously once for moderate to severe anaphylaxis, nurse to call physician and pharmacy when reaction occurs and call 911 if needed for immediate care   Diphenhydramine 50mg91mIV vial: Administer 25-50mg 79mM PRN for first dose reaction, rash, itching, mild reaction, nurse to call physician and pharmacy when reaction occurs   Sodium Chloride 0.9% NS 500ml I48mdminister if needed for hypovolemic blood pressure drop or as ordered by physician after call to physician with anaphylactic reaction   Call MD for:  difficulty breathing, headache or visual disturbances   Complete by: As directed    Call MD for:  extreme fatigue   Complete by: As directed    Call MD for:  persistant dizziness or light-headedness   Complete by: As directed    Call MD for:  persistant nausea and vomiting   Complete by: As directed    Call MD for:  redness, tenderness, or signs of infection (pain, swelling, redness, odor or green/yellow discharge around incision site)   Complete by: As directed    Call MD for:  severe uncontrolled pain   Complete by: As directed    Call MD for:  temperature >100.4   Complete by: As directed    Change dressing on IV access line weekly and PRN   Complete by: As directed    Discharge instructions   Complete by: As directed    You were cared for by a hospitalist during your hospital stay. If you have any questions about your discharge  medications or the care you received while you were in the hospital after you are discharged, you can call the unit and ask to speak with the hospitalist on call if the hospitalist that took care of you is not available. Once you are discharged, your primary care physician will handle any further medical issues. Please note that NO REFILLS for any discharge  medications will be authorized once you are discharged, as it is imperative that you return to your primary care physician (or establish a relationship with a primary care physician if you do not have one) for your aftercare needs so that they can reassess your need for medications and monitor your lab values.   Flush IV access with Sodium Chloride 0.9% and Heparin 10 units/ml or 100 units/ml   Complete by: As directed    Home infusion instructions - [facility]   Complete by: As directed    Instructions: Flush IV access with Sodium Chloride 0.9% and Heparin 10units/ml or 100units/ml   Change dressing on IV access line: Weekly and PRN   Instructions Cath Flo 60m: Administer for PICC Line occlusion and as ordered by physician for other access device   [facility] pharmacist to adjust dose for: Vancomycin, Aminoglycosides and other anti-infective therapies as requested by physician   Increase activity slowly   Complete by: As directed    Method of administration may be changed at the discretion of home infusion pharmacist based upon assessment of the patient and/or caregiver's ability to self-administer the medication ordered   Complete by: As directed    No wound care   Complete by: As directed       Allergies as of 07/15/2020       Reactions   Eggs Or Egg-derived Products    Pt    Sulfa Antibiotics    Wheat Bran         Medication List     STOP taking these medications    clindamycin 300 MG capsule Commonly known as: CLEOCIN   HYDROcodone-acetaminophen 5-325 MG tablet Commonly known as: NORCO/VICODIN   hydroxyurea 500 MG capsule Commonly known as: HYDREA       TAKE these medications    b complex vitamins capsule Take 1 capsule by mouth daily.   CALCIUM & VIT D3 BONE HEALTH PO Take by mouth.   carvedilol 3.125 MG tablet Commonly known as: COREG SMARTSIG:1 Tablet(s) By Mouth   cefTRIAXone  IVPB Commonly known as: ROCEPHIN Inject 2 g into  the vein daily for 27 days. Indication:  Empyema First Dose: No Last Day of Therapy:  08/10/2020 Labs - Once weekly:  CBC/D and BMP, Labs - Every other week:  ESR and CRP Method of administration: IV Push Method of administration may be changed at the discretion of home infusion pharmacist based upon assessment of the patient and/or caregiver's ability to self-administer the medication ordered.   cholecalciferol 1000 units tablet Commonly known as: VITAMIN D Take 10,000 Units by mouth daily.   co-enzyme Q-10 30 MG capsule Take 30 mg by mouth 3 (three) times daily.   DIGESTIVE ENZYMES PO Take by mouth.   estradiol 0.05 MG/24HR patch Commonly known as: VIVELLE-DOT 1 patch every 3 (three) days.   fluticasone 50 MCG/ACT nasal spray Commonly known as: FLONASE Place into both nostrils daily.   Hydromet 5-1.5 MG/5ML syrup Generic drug: HYDROcodone bit-homatropine SMARTSIG:1.5 Teaspoon By Mouth Every Night   ibuprofen 200 MG tablet Commonly known as: ADVIL Take 200 mg by mouth  every 6 (six) hours as needed. What changed: Another medication with the same name was removed. Continue taking this medication, and follow the directions you see here.   L-TYROSINE PO Take 50 mg by mouth.   levocetirizine 5 MG tablet Commonly known as: XYZAL Take 5 mg by mouth every evening.   metroNIDAZOLE 500 MG tablet Commonly known as: Flagyl Take 1 tablet (500 mg total) by mouth 2 (two) times daily for 27 days. Continue with IV antibiotics through 08/10/2020   progesterone 100 MG capsule Commonly known as: PROMETRIUM Take 200 mg by mouth at bedtime.   thiamine 100 MG tablet Commonly known as: Vitamin B-1 Take 100 mg by mouth daily.   vitamin C 100 MG tablet Take 100 mg by mouth daily.               Discharge Care Instructions  (From admission, onward)           Start     Ordered   07/15/20 0000  Change dressing on IV access line weekly and PRN  (Home infusion instructions -  [facility] )        07/15/20 1437            Follow-up Information     Collene Gobble, MD Follow up on 08/19/2020.   Specialty: Pulmonary Disease Why: 9:15am Contact information: [address] [place] [address] [phone]         Magdalen Spatz, NP Follow up on 08/05/2020.   Specialty: Pulmonary Disease Why: 4:00pm Contact information: [address] [place] [address] [phone]         Rosiland Oz, MD. Schedule an appointment as soon as possible for a visit in 3 week(s).   Specialty: Infectious Diseases Contact information: [place] [address] [place], [facility] Follow up.   Specialty: Home Health Services Why: [facility] nusing-iv abx instruction Contact information: [address] [place] [address] [phone]         [facility] Follow up.   Why: iv abx initial instruction;supplies Contact information: [address], [place], [place]. Schedule an appointment as soon as possible for a visit in 1 week(s).   Specialty: Obstetrics and Gynecology Contact information: [place] [address] [phone]                Allergies  Allergen Reactions   Eggs Or Egg-Derived Products     Pt    Sulfa Antibiotics    Wheat Bran     Procedures/Studies: CT CHEST WO CONTRAST  Result Date: 07/03/2020 CLINICAL DATA:  Empyema EXAM: CT CHEST WITHOUT CONTRAST TECHNIQUE: Multidetector CT imaging of the chest was performed following the standard protocol without IV contrast. COMPARISON:  Chest CT June 30, 2020 and multiple prior chest radiographs FINDINGS: Cardiovascular: Aortic atherosclerosis without aneurysmal dilation. Normal size heart. Small pericardial effusion. Mediastinum/Nodes: Hypodense 11 mm left thyroid nodule. Not clinically significant; no follow-up  imaging recommended (ref: J Am Coll Radiol. 2015 Feb;12(2): 143-50).Multiple prominent to mildly enlarged lower cervical, mediastinal and left axillary lymph nodes are again visualized and appears stable for instance the previously described 13 mm subcarinal lymph node measures 12 mm in short axis on image 50/2 and a left cardiophrenic lymph node measuring 13 mm on image 102/2. No  significant mediastinal shift. The trachea and esophagus are unremarkable. Lungs/Pleura: There is a left lateral approach thoracostomy tube extending into the posterior aspect of the left hemithorax with tip located in the major fissure. Small moderate left pneumothorax with incomplete re-expansion of the left lung extensive left-sided nodular pleural thickening including a thick rind of nodular thickening involving the visceral pleura of the left lung. Similar appearance of the mass like area along the left anterior abdominal wall measuring 4.0 x 3.5 cm on image 129/2, which on sagittal image 118/7 appears to represent loculated fluid along the costophrenic angle as opposed to a discrete chest wall mass. Multifocal consolidations, nodularity and ground-glass opacities with septal thickening throughout the left lung. Upper Abdomen: Trace upper abdominal, predominantly perihepatic, free fluid. Musculoskeletal: Subcutaneous emphysema in the left chest wall. Bilateral breast calcifications. No acute osseous abnormality. Multifocal degenerative changes spine. IMPRESSION: 1. Left lateral approach thoracostomy tube with tip located in the major fissure, with a small moderate left pneumothorax. There is extensive nodular thickening of the parietal and visceral pleura throughout the left hemithorax with incomplete re-expansion of the left lung suggestive of the entrapment. 2. Similar appearance of the mass like area along the left anterior abdominal measuring 4 cm which is favored to represent loculated fluid along the costophrenic angle as  opposed to a discrete chest wall mass. 3. Multifocal consolidations, nodularity and ground-glass opacities with septal thickening throughout the left lung. 4. Prominent to mildly enlarged lower cervical, mediastinal, and left axillary lymph nodes are again visualized and appears stable. 5. Trace upper abdominal, predominantly perihepatic, free fluid. 6. Diffuse subcutaneous edema with left chest wall subcutaneous emphysema. 7. Aortic atherosclerosis. Aortic Atherosclerosis (ICD10-I70.0). Electronically Signed   By: Dahlia Bailiff MD   On: 07/03/2020 09:36   CT Chest Wo Contrast  Result Date: 06/30/2020 CLINICAL DATA:  Fever, cough, shortness of breath for 8-10 days, LEFT chest and rib pain, chest x-ray with pleural effusion EXAM: CT CHEST WITHOUT CONTRAST TECHNIQUE: Multidetector CT imaging of the chest was performed following the standard protocol without IV contrast. Sagittal and coronal MPR images reconstructed from axial data set. COMPARISON:  None FINDINGS: Cardiovascular: Small pericardial effusion. Heart otherwise unremarkable. Aorta normal caliber. Minimal atherosclerotic calcification of aorta and coronary arteries. Mediastinum/Nodes: 11 mm LEFT thyroid nodule; Not clinically significant; no follow-up imaging recommended (ref: J Am Coll Radiol. 2015 Feb;12(2): 143-50).Esophagus unremarkable. Multiple though normal sized mediastinal lymph nodes. Mildly enlarged subcarinal lymph node 13 mm short axis image 56. Slight mediastinal shift LEFT to RIGHT. 7 mm inferior RIGHT cervical lymph node image 10. Lungs/Pleura: Subtotal opacification of the LEFT hemithorax by large pleural effusion. 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 Complete atelectasis of LEFT lower lobe with subtotal atelectasis of LEFT upper lobe. Occlusion of LEFT upper lobe bronchus at LEFT hilum. Marked narrowing of LEFT lower lobe bronchi.  Findings suggest presence of tumor at the LEFT hilum though this is difficult to differentiate from adjacent atelectatic central lung and pleural based soft tissue in LEFT hemithorax. Upper Abdomen: Question small cyst at medial upper LEFT kidney 13 mm diameter. Abnormal soft tissue density in the anterolateral LEFT upper quadrant invading the abdominal wall 3.6 x 2.8 cm image 130 consistent with tumor. Musculoskeletal: No acute osseous findings. IMPRESSION: Subtotal opacification of the LEFT hemithorax by large pleural effusion with complete atelectasis of LEFT lower lobe and subtotal atelectasis of LEFT upper lobe. 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 Marked narrowing of LEFT lower lobe bronchi with occlusion of LEFT upper lobe bronchus at LEFT hilum suspicious for hilar tumor, difficult to delineate due to adjacent opacified lung and pleural based tumor. Abnormal soft tissue density in the anterolateral LEFT upper quadrant invading the abdominal wall 3.6 x 2.8 cm consistent with tumor. Mildly enlarged subcarinal lymph node. Small pericardial effusion. Aortic Atherosclerosis (ICD10-I70.0). Electronically Signed   By: Lavonia Dana M.D.   On: 06/30/2020 08:04   CT CHEST W CONTRAST  Result Date: 07/08/2020 CLINICAL DATA:  Followup and plana.  Left chest tube in place. EXAM: CT CHEST WITH CONTRAST TECHNIQUE: Multidetector CT imaging of the chest was performed during intravenous contrast administration. CONTRAST:  30m OMNIPAQUE IOHEXOL 300 MG/ML  SOLN COMPARISON:  Chest CT 07/03/2020 FINDINGS: Cardiovascular: No acute vascular findings. Heart is normal in size. Small pericardial effusion is similar to prior exam. Mediastinum/Nodes: Stable 11 mm left thyroid nodule, no further follow-up recommended per consensus guidelines. Decreasing size of multiple prominent mediastinal and left axillary lymph nodes from  prior exam. There is no progressive adenopathy. Decompressed esophagus. Soft tissue/fluid deposits in the left anterior mediastinum in the anterior epicardial space may represent hypodense nodes or small fluid collections, 15 and 14 mm respectively, not significantly changed. Small fluid collections are favored. Lungs/Pleura: Thick-walled left side air-fluid collection with thickening of the pleura and pleural adhesions at the left lung base. Chest tube is in place within the pleural space posteriorly. Greatest residual fluid collection is at the left lung apex with air-fluid level. The amount of fluid at the apex has minimally increased from prior exam. The pneumothorax component has diminished slightly. Persistent partially loculated fluid in the medial left lung base. Persistent but decreasing air-fluid collections along the course of the chest tube and in the left lateral chest wall. There are irregular opacities throughout the left lung, with improvement from prior exam and decrease in the ground-glass component. No obvious pulmonary mass. The right lung is clear. Right apical pulmonary nodule measures 4 mm, series 7, image 30, unchanged. Trachea and central bronchi are widely patent. Previous right pleural effusion has resolved. Upper Abdomen: No acute findings. Previous perihepatic fluid has resolved. Musculoskeletal: Stable osseous structures. Some peripherally enhancing fluid is seen within the left anterior intercostal space 7-8, series 2, image 135. This was previously aspirated. IMPRESSION: 1. Left-sided empyema/pyo-pneumothorax with decreased air component. Slight increased fluid component at the apex. Chest tube in place within the pleural space posteriorly. 2. Small foci of loculated fluid in the intercostal space at left rib 7-8 anteriorly, previously aspirated, as well as likely in the anterior epicardial space. 3. Persistent but decreasing air-fluid collections along the course of the chest tube in  the left lateral chest wall. 4. Improved irregular opacities throughout the left lung, with decrease in the ground-glass component, likely resolving pneumonia. Improving mediastinal adenopathy. 5. Resolved right pleural effusion. Electronically Signed   By: MKeith RakeM.D.   On: 07/08/2020 17:36   CT ASPIRATION  Result Date: 07/03/2020 INDICATION: 65year old female with abnormal soft tissue density in the lung left anterior inferior pleural space concerning for pleural based tumor versus extension of empyema. She presents for CT-guided aspiration versus biopsy. EXAM: CT-guided aspiration MEDICATIONS: The patient is currently admitted to the hospital and receiving intravenous antibiotics. The antibiotics were administered within an appropriate time frame prior to the initiation of the procedure. ANESTHESIA/SEDATION: Fentanyl 100  mcg IV; Versed 2 mg IV Moderate Sedation Time:  10 minutes The patient was continuously monitored during the procedure by the interventional radiology nurse under my direct supervision. COMPLICATIONS: None immediate. PROCEDURE: Informed written consent was obtained from the patient after a thorough discussion of the procedural risks, benefits and alternatives. All questions were addressed. Maximal Sterile Barrier Technique was utilized including caps, mask, sterile gowns, sterile gloves, sterile drape, hand hygiene and skin antiseptic. A timeout was performed prior to the initiation of the procedure. A planning axial CT scan was performed. The fluid collection was successfully identified. The overlying skin was sterilely prepped and draped in the standard fashion using chlorhexidine. Local anesthesia was attained by infiltration with 1% lidocaine. A small dermatotomy was made. An 18 gauge trocar needle was advanced into the structure. There was minimal tactile feedback indicating that this represents fluid rather than solid soft tissue. Aspiration was then performed yielding  approximately 10 mL of thick purulent fluid. Repeat imaging demonstrates near complete resolution of the abnormality. IMPRESSION: The CT abnormality corresponds with a pocket of complex purulent fluid. 10 mL was successfully aspirated and sent for culture. No definite soft tissue mass present for biopsy. Electronically Signed   By: Jacqulynn Cadet M.D.   On: 07/03/2020 16:22   DG Chest Port 1 View  Result Date: 07/13/2020 CLINICAL DATA:  Left pleural effusion EXAM: PORTABLE CHEST 1 VIEW COMPARISON:  07/07/2020 FINDINGS: Large bore left chest tube is unchanged overlying the left mid lung zone. Pleural thickening and loculated left hydropneumothorax are unchanged. Marked left-sided volume loss is again noted, unchanged. Right lung is clear. No pneumothorax or pleural effusion on the right. Cardiac size is within normal limits. Pulmonary vascularity is normal. Mild thoracic dextroscoliosis noted. IMPRESSION: Stable moderate left hydropneumothorax with pleural thickening suggesting a pneumothorax ex vacuo. Large bore left chest tube unchanged. Left-sided volume loss unchanged likely reflecting trapped lung. Electronically Signed   By: Fidela Salisbury MD   On: 07/13/2020 06:46   DG CHEST PORT 1 VIEW  Result Date: 07/07/2020 CLINICAL DATA:  Shortness of breath.  Chest tube. EXAM: PORTABLE CHEST 1 VIEW COMPARISON:  07/05/2020. FINDINGS: Left chest tube in stable position. Left pneumothorax unchanged. Stable left-sided pleural thickening and or effusion. Atelectatic changes left lung. Stable elevation left hemidiaphragm. Right lung is clear. Heart size stable. Thoracic spine scoliosis. IMPRESSION: Left chest tube in stable position. Stable left-sided pneumothorax. Stable left-sided pleural thickening and or effusion. Stable atelectatic changes left lung. Stable elevation left hemidiaphragm. Electronically Signed   By: [place]   On: 07/07/2020 09:02   DG CHEST PORT 1 VIEW  Result Date:  07/05/2020 CLINICAL DATA:  Shortness of breath. EXAM: PORTABLE CHEST 1 VIEW COMPARISON:  July 04, 2020 FINDINGS: Cardiomediastinal silhouette is normal. Mediastinal contours appear intact. Unchanged position of the left chest tube. Slight enlargement of the left pneumothorax with marked thickening of the pleura, linear and nodular interstitial thickening throughout the left lung. Small left pleural effusion. Osseous structures are without acute abnormality. Soft tissues are grossly normal. IMPRESSION: 1. Slight enlargement of the left pneumothorax with marked thickening of the pleura, and associated interstitial thickening. 2. Small left pleural effusion. Electronically Signed   By: Fidela Salisbury M.D.   On: 07/05/2020 11:44   DG CHEST PORT 1 VIEW  Result Date: 07/04/2020 CLINICAL DATA:  Chest tube.  Follow-up pleural effusion EXAM: PORTABLE CHEST 1 VIEW COMPARISON:  07/03/2020 FINDINGS: Left chest tube remains in place. Left pneumothorax unchanged from the prior  study. Diffuse pleural thickening on the left unchanged. Diffuse airspace disease in the left lung is unchanged. Left lower lobe volume loss and consolidation unchanged. Right lung remains clear. IMPRESSION: Mild to moderate left pneumothorax unchanged with diffuse airspace disease in the left lung. Left chest tube remains in place. Electronically Signed   By: Franchot Gallo M.D.   On: 07/04/2020 09:43   DG CHEST PORT 1 VIEW  Result Date: 07/03/2020 CLINICAL DATA:  65 year old female with left chest tube placed for large loculated appearing pleural effusion - which returned pus. EXAM: PORTABLE CHEST 1 VIEW COMPARISON:  Portable chest 07/02/2020 and earlier. FINDINGS: Portable AP semi upright view at 0509 hours. Stable left chest tube. Small volume left pneumothorax with circumferential appearing pleural thickening superimposed. Abnormal hazy, reticulonodular opacity throughout the left lung. Mediastinal contours are stable and within normal  limits. The right lung remains clear. Visualized tracheal air column is within normal limits. Negative visible bowel gas pattern. Small volume left chest wall subcutaneous gas. No acute osseous abnormality identified. IMPRESSION: 1. Empyema with stable left chest tube. Poor re-expansion of the left lung which might be related to pneumonia. Small volume left pneumothorax/pleural gas superimposed on diffuse pleural thickening. 2. Right lung remains clear. Electronically Signed   By: Genevie Ann M.D.   On: 07/03/2020 06:44   DG CHEST PORT 1 VIEW  Result Date: 07/02/2020 CLINICAL DATA:  Shortness of breath EXAM: PORTABLE CHEST 1 VIEW COMPARISON:  July 01, 2020 FINDINGS: Chest tube present on the left with tip directed medially and superiorly, stable. There is a persistent lateral and lateral basilar pneumothorax on the left, marginally larger than 1 day prior. Mild subcutaneous air also noted on the left. There is airspace opacity in the left base with asymmetric pulmonary edema on the left, stable. Right lung is clear. Heart size and pulmonary vascularity are normal. No adenopathy. No bone lesions. IMPRESSION: Chest tube on left, unchanged, with left-sided pneumothorax, marginally larger. No tension component. Airspace opacity consistent with pneumonia left base. Asymmetric pulmonary edema on the left is stable. Right lung clear. Stable cardiac silhouette. Electronically Signed   By: Lowella Grip III M.D.   On: 07/02/2020 07:54   DG Chest Port 1 View  Result Date: 07/01/2020 CLINICAL DATA:  Status post chest tube placement EXAM: PORTABLE CHEST 1 VIEW COMPARISON:  Film from earlier in the same day. FINDINGS: Previously seen pigtail catheter has been removed and a large bore left-sided chest tube placed. Significant reduction in pleural effusion is seen. A small amount of air is noted within the pleural space related to the catheter placement. Right lung is clear. Patchy infiltrate in the left base is noted.  Cardiac shadow is stable. IMPRESSION: Significant reduction in left-sided pleural effusion. Pleural margins are somewhat thickened which corresponds to the pleural density seen on recent CT examination. Left lower lobe infiltrate. Electronically Signed   By: Inez Catalina M.D.   On: 07/01/2020 12:39   DG CHEST PORT 1 VIEW  Result Date: 07/01/2020 CLINICAL DATA:  Empyema, chest tube EXAM: PORTABLE CHEST 1 VIEW COMPARISON:  CT 06/30/2020, radiograph 06/30/2020 FINDINGS: Left pigtail pleural catheter is noted with possible kinking at the insertion site along the lateral chest wall. Increasing size of a loculated left pleural effusion with extensive heterogeneous opacity throughout the residually aerated portions of the left lung likely reflecting combination of passive atelectatic change and possible underlying airspace disease. Right lung is predominantly clear. No right effusion. No visible pneumothorax. Telemetry leads and support  devices overlie the chest. No acute osseous or soft tissue abnormality. IMPRESSION: Left pigtail catheter remains in place with some possible kinking at the insertion site along the left chest wall. Increasing size of the loculated left pleural effusion. Heterogeneous opacity within the residually aerated portions of the left lung likely reflect a combination of airspace disease and passive atelectasis. These results will be called to the ordering clinician or representative by the Radiologist Assistant, and communication documented in the PACS or [employer]. Electronically Signed   By: Lovena Le M.D.   On: 07/01/2020 06:32   DG Chest Port 1 View  Result Date: 06/30/2020 CLINICAL DATA:  Status post chest tube placement. EXAM: PORTABLE CHEST 1 VIEW COMPARISON:  Chest radiograph and CT 06/30/2020 FINDINGS: A left-sided chest tube has been placed terminating over the mid lung. There is a moderate residual left pleural effusion which appears partially loculated. Pleural fluid  has decreased significantly following chest tube placement with partial re-expansion of the left lung. There is abnormal density in the left hilar and infrahilar regions as noted on CT. The right lung remains clear. No pneumothorax is identified. Mild thoracic dextroscoliosis is noted. IMPRESSION: Interval chest tube placement with decreased size of left pleural effusion and partial re-expansion of the left lung. Electronically Signed   By: Logan Bores M.D.   On: 06/30/2020 10:37   DG Chest Portable 1 View  Result Date: 06/30/2020 CLINICAL DATA:  SOB EXAM: PORTABLE CHEST 1 VIEW COMPARISON:  None. FINDINGS: Obscured cardiomediastinal silhouette, mildly shifted towards the right. There is complete opacification of the left hemithorax. The right lung is clear. There is no visible pneumothorax. No acute osseous abnormality. IMPRESSION: Complete opacification of the left hemithorax, favored to be due to a large pleural effusion. Slight rightward mediastinal shift which can be seen with tension physiology. These results were called by telephone at the time of interpretation on 06/30/2020 at 7:13am to provider [facility] , who verbally acknowledged these results. Electronically Signed   By: Maurine Simmering   On: 06/30/2020 07:15   ECHOCARDIOGRAM COMPLETE  Result Date: 07/03/2020    ECHOCARDIOGRAM REPORT   Patient Name:   Jamie Carr Date of Exam: 07/03/2020 Medical Rec #:  [id number]  Height:       62.0 in Accession #:    [id number] Weight:       127.9 lb Date of Birth:  August 06, 1955   BSA:          1.581 m Patient Age:    65 years   BP:           118/61 mmHg Patient Gender: F          HR:           93 bpm. Exam Location:  Inpatient Procedure: 2D Echo, Cardiac Doppler and Color Doppler Indications:    Dyspnea R06.00  History:        Patient has no prior history of Echocardiogram examinations.                 Thyroid Disease.  Sonographer:    Jonelle Sidle Dance Referring Phys: [place]herty  Sonographer Comments: No  apical window. IMPRESSIONS  1. Left ventricular ejection fraction, by estimation, is 60 to 65%. The left ventricle has normal function. The left ventricle has no regional wall motion abnormalities. Left ventricular diastolic function could not be evaluated.  2. Right ventricular systolic function is normal. The right ventricular size is normal.  3. The mitral valve  is normal in structure. No evidence of mitral valve regurgitation. No evidence of mitral stenosis.  4. The aortic valve is grossly normal. Aortic valve regurgitation is not visualized. No aortic stenosis is present. FINDINGS  Left Ventricle: Left ventricular ejection fraction, by estimation, is 60 to 65%. The left ventricle has normal function. The left ventricle has no regional wall motion abnormalities. The left ventricular internal cavity size was normal in size. There is  no left ventricular hypertrophy. Left ventricular diastolic function could not be evaluated. Right Ventricle: The right ventricular size is normal. No increase in right ventricular wall thickness. Right ventricular systolic function is normal. Left Atrium: Left atrial size was normal in size. Right Atrium: Right atrial size was normal in size. Pericardium: There is no evidence of pericardial effusion. Mitral Valve: The mitral valve is normal in structure. No evidence of mitral valve regurgitation. No evidence of mitral valve stenosis. Tricuspid Valve: The tricuspid valve is normal in structure. Tricuspid valve regurgitation is trivial. Aortic Valve: The aortic valve is grossly normal. Aortic valve regurgitation is not visualized. No aortic stenosis is present. Pulmonic Valve: The pulmonic valve was not well visualized. Pulmonic valve regurgitation is not visualized. Aorta: The aortic root and ascending aorta are structurally normal, with no evidence of dilitation. IAS/Shunts: The atrial septum is grossly normal.  LEFT VENTRICLE PLAX 2D LVIDd:         3.70 cm LVIDs:         2.60 cm LV  PW:         1.00 cm LV IVS:        0.90 cm LVOT diam:     2.20 cm LVOT Area:     3.80 cm  IVC IVC diam: 1.70 cm LEFT ATRIUM         Index LA diam:    2.80 cm 1.77 cm/m   AORTA Ao Root diam: 3.10 cm Ao Asc diam:  3.30 cm  SHUNTS Systemic Diam: 2.20 cm Mertie Moores MD Electronically signed by Mertie Moores MD Signature Date/Time: 07/03/2020/3:12:43 PM    Final    [place] EKG SITE RITE  Result Date: 07/13/2020 If Site Rite image not attached, placement could not be confirmed due to current cardiac rhythm.  US Abdomen Limited RUQ (LIVER/GB)  Result Date: 07/06/2020 CLINICAL DATA:  Abnormal LFTs EXAM: ULTRASOUND ABDOMEN LIMITED RIGHT UPPER QUADRANT COMPARISON:  None. FINDINGS: Gallbladder: No gallstones or wall thickening visualized. No sonographic Murphy sign noted by sonographer. Common bile duct: Diameter: 5.6 mm Liver: No focal lesion identified. Within normal limits in parenchymal echogenicity. Portal vein is patent on color Doppler imaging with normal direction of blood flow towards the liver. Other: None. IMPRESSION: Unremarkable right upper quadrant ultrasound. Electronically Signed   By: Inez Catalina M.D.   On: 07/06/2020 20:58       Discharge Exam: Vitals:   07/15/20 0523 07/15/20 1334  BP: 119/76 117/71  Pulse: 87 89  Resp: 16 16  Temp: 98 F (36.7 C) 98.6 F (37 C)  SpO2:  96%    General: Pt is alert, awake, not in acute distress Cardiovascular: RRR, S1/S2 +, no edema Respiratory: CTA bilaterally, no wheezing, no rhonchi, no respiratory distress, no conversational dyspnea  Abdominal: Soft, NT, ND, bowel sounds + Extremities: no edema, no cyanosis Psych: Normal mood and affect, stable judgement and insight     The results of significant diagnostics from this hospitalization (including imaging, microbiology, ancillary and laboratory) are listed below for reference.  Microbiology: No results found for this or any previous visit (from the past 240 hour(s)).   Labs: BNP  (last 3 results) No results for input(s): BNP in the last 8760 hours. Basic Metabolic Panel: Recent Labs  Lab 07/10/20 0424 07/13/20 0438 07/14/20 0419 07/15/20 0810  NA 136 142  --  141  K 2.9* 3.5  --  2.7*  CL 101 106  --  104  CO2 24 26  --  26  GLUCOSE 88 82  --  141*  BUN 8 9  --  7*  CREATININE 0.64 0.64 0.81 0.65  CALCIUM 8.6* 9.0  --  9.0  MG  --   --   --  2.1   Liver Function Tests: Recent Labs  Lab 07/10/20 0424 07/13/20 0438  AST 57* 20  ALT 56* 34  ALKPHOS 92 91  BILITOT 0.5 0.5  PROT 5.9* 6.8  ALBUMIN 2.4* 2.8*   No results for input(s): LIPASE, AMYLASE in the last 168 hours. No results for input(s): AMMONIA in the last 168 hours. CBC: Recent Labs  Lab 07/10/20 0424  WBC 10.8*  HGB 8.9*  HCT 27.9*  MCV 98.2  PLT 531*   Cardiac Enzymes: No results for input(s): CKTOTAL, CKMB, CKMBINDEX, TROPONINI in the last 168 hours. BNP: Invalid input(s): POCBNP CBG: Recent Labs  Lab 07/11/20 0449 07/12/20 0514 07/13/20 0453 07/14/20 0448 07/15/20 0521  GLUCAP 80 86 81 119* 89   D-Dimer No results for input(s): DDIMER in the last 72 hours. Hgb A1c No results for input(s): HGBA1C in the last 72 hours. Lipid Profile No results for input(s): CHOL, HDL, LDLCALC, TRIG, CHOLHDL, LDLDIRECT in the last 72 hours. Thyroid function studies No results for input(s): TSH, T4TOTAL, T3FREE, THYROIDAB in the last 72 hours.  Invalid input(s): FREET3 Anemia work up No results for input(s): VITAMINB12, FOLATE, FERRITIN, TIBC, IRON, RETICCTPCT in the last 72 hours. Urinalysis No results found for: COLORURINE, APPEARANCEUR, LABSPEC, [place], GLUCOSEU, HGBUR, BILIRUBINUR, KETONESUR, PROTEINUR, UROBILINOGEN, NITRITE, LEUKOCYTESUR Sepsis Labs Invalid input(s): PROCALCITONIN,  WBC,  LACTICIDVEN Microbiology No results found for this or any previous visit (from the past 240 hour(s)).   Patient was seen and examined on the day of discharge and was found to be in  stable condition. Time coordinating discharge: 40 minutes including assessment and coordination of care, as well as examination of the patient.   SIGNED:  Dessa Phi, DO [facility] 07/15/2020, 5:22 PM

## 2020-07-16 DIAGNOSIS — J869 Pyothorax without fistula: Secondary | ICD-10-CM | POA: Diagnosis not present

## 2020-07-16 DIAGNOSIS — J90 Pleural effusion, not elsewhere classified: Secondary | ICD-10-CM | POA: Diagnosis not present

## 2020-07-16 DIAGNOSIS — Z6820 Body mass index (BMI) 20.0-20.9, adult: Secondary | ICD-10-CM | POA: Diagnosis not present

## 2020-07-16 DIAGNOSIS — R918 Other nonspecific abnormal finding of lung field: Secondary | ICD-10-CM | POA: Diagnosis not present

## 2020-07-17 DIAGNOSIS — E440 Moderate protein-calorie malnutrition: Secondary | ICD-10-CM | POA: Diagnosis not present

## 2020-07-17 DIAGNOSIS — B954 Other streptococcus as the cause of diseases classified elsewhere: Secondary | ICD-10-CM | POA: Diagnosis not present

## 2020-07-17 DIAGNOSIS — J9601 Acute respiratory failure with hypoxia: Secondary | ICD-10-CM | POA: Diagnosis not present

## 2020-07-17 DIAGNOSIS — J90 Pleural effusion, not elsewhere classified: Secondary | ICD-10-CM | POA: Diagnosis not present

## 2020-07-17 DIAGNOSIS — I700 Atherosclerosis of aorta: Secondary | ICD-10-CM | POA: Diagnosis not present

## 2020-07-17 DIAGNOSIS — G250 Essential tremor: Secondary | ICD-10-CM | POA: Diagnosis not present

## 2020-07-17 DIAGNOSIS — J869 Pyothorax without fistula: Secondary | ICD-10-CM | POA: Diagnosis not present

## 2020-07-17 DIAGNOSIS — E876 Hypokalemia: Secondary | ICD-10-CM | POA: Diagnosis not present

## 2020-07-17 DIAGNOSIS — D649 Anemia, unspecified: Secondary | ICD-10-CM | POA: Diagnosis not present

## 2020-07-17 DIAGNOSIS — D72829 Elevated white blood cell count, unspecified: Secondary | ICD-10-CM | POA: Diagnosis not present

## 2020-07-17 DIAGNOSIS — D75839 Thrombocytosis, unspecified: Secondary | ICD-10-CM | POA: Diagnosis not present

## 2020-07-17 DIAGNOSIS — E079 Disorder of thyroid, unspecified: Secondary | ICD-10-CM | POA: Diagnosis not present

## 2020-07-17 DIAGNOSIS — G40909 Epilepsy, unspecified, not intractable, without status epilepticus: Secondary | ICD-10-CM | POA: Diagnosis not present

## 2020-07-17 DIAGNOSIS — J939 Pneumothorax, unspecified: Secondary | ICD-10-CM | POA: Diagnosis not present

## 2020-07-17 DIAGNOSIS — J9811 Atelectasis: Secondary | ICD-10-CM | POA: Diagnosis not present

## 2020-07-17 DIAGNOSIS — M419 Scoliosis, unspecified: Secondary | ICD-10-CM | POA: Diagnosis not present

## 2020-07-21 DIAGNOSIS — B954 Other streptococcus as the cause of diseases classified elsewhere: Secondary | ICD-10-CM | POA: Diagnosis not present

## 2020-07-21 DIAGNOSIS — J939 Pneumothorax, unspecified: Secondary | ICD-10-CM | POA: Diagnosis not present

## 2020-07-21 DIAGNOSIS — E440 Moderate protein-calorie malnutrition: Secondary | ICD-10-CM | POA: Diagnosis not present

## 2020-07-21 DIAGNOSIS — E876 Hypokalemia: Secondary | ICD-10-CM | POA: Diagnosis not present

## 2020-07-21 DIAGNOSIS — G40909 Epilepsy, unspecified, not intractable, without status epilepticus: Secondary | ICD-10-CM | POA: Diagnosis not present

## 2020-07-21 DIAGNOSIS — D72829 Elevated white blood cell count, unspecified: Secondary | ICD-10-CM | POA: Diagnosis not present

## 2020-07-21 DIAGNOSIS — J90 Pleural effusion, not elsewhere classified: Secondary | ICD-10-CM | POA: Diagnosis not present

## 2020-07-21 DIAGNOSIS — J9811 Atelectasis: Secondary | ICD-10-CM | POA: Diagnosis not present

## 2020-07-21 DIAGNOSIS — J869 Pyothorax without fistula: Secondary | ICD-10-CM | POA: Diagnosis not present

## 2020-07-21 DIAGNOSIS — J9601 Acute respiratory failure with hypoxia: Secondary | ICD-10-CM | POA: Diagnosis not present

## 2020-07-21 DIAGNOSIS — G250 Essential tremor: Secondary | ICD-10-CM | POA: Diagnosis not present

## 2020-07-21 DIAGNOSIS — M419 Scoliosis, unspecified: Secondary | ICD-10-CM | POA: Diagnosis not present

## 2020-07-21 DIAGNOSIS — D649 Anemia, unspecified: Secondary | ICD-10-CM | POA: Diagnosis not present

## 2020-07-21 DIAGNOSIS — E079 Disorder of thyroid, unspecified: Secondary | ICD-10-CM | POA: Diagnosis not present

## 2020-07-21 DIAGNOSIS — I700 Atherosclerosis of aorta: Secondary | ICD-10-CM | POA: Diagnosis not present

## 2020-07-21 DIAGNOSIS — D75839 Thrombocytosis, unspecified: Secondary | ICD-10-CM | POA: Diagnosis not present

## 2020-07-23 DIAGNOSIS — J9811 Atelectasis: Secondary | ICD-10-CM | POA: Diagnosis not present

## 2020-07-23 DIAGNOSIS — Z6821 Body mass index (BMI) 21.0-21.9, adult: Secondary | ICD-10-CM | POA: Diagnosis not present

## 2020-07-23 DIAGNOSIS — J869 Pyothorax without fistula: Secondary | ICD-10-CM | POA: Diagnosis not present

## 2020-07-23 DIAGNOSIS — J90 Pleural effusion, not elsewhere classified: Secondary | ICD-10-CM | POA: Diagnosis not present

## 2020-07-23 DIAGNOSIS — R918 Other nonspecific abnormal finding of lung field: Secondary | ICD-10-CM | POA: Diagnosis not present

## 2020-07-24 DIAGNOSIS — J869 Pyothorax without fistula: Secondary | ICD-10-CM | POA: Diagnosis not present

## 2020-07-24 DIAGNOSIS — G250 Essential tremor: Secondary | ICD-10-CM | POA: Diagnosis not present

## 2020-07-24 DIAGNOSIS — G40909 Epilepsy, unspecified, not intractable, without status epilepticus: Secondary | ICD-10-CM | POA: Diagnosis not present

## 2020-07-24 DIAGNOSIS — R918 Other nonspecific abnormal finding of lung field: Secondary | ICD-10-CM | POA: Diagnosis not present

## 2020-07-24 DIAGNOSIS — E063 Autoimmune thyroiditis: Secondary | ICD-10-CM | POA: Diagnosis not present

## 2020-07-28 DIAGNOSIS — D649 Anemia, unspecified: Secondary | ICD-10-CM | POA: Diagnosis not present

## 2020-07-28 DIAGNOSIS — D72829 Elevated white blood cell count, unspecified: Secondary | ICD-10-CM | POA: Diagnosis not present

## 2020-07-28 DIAGNOSIS — E440 Moderate protein-calorie malnutrition: Secondary | ICD-10-CM | POA: Diagnosis not present

## 2020-07-28 DIAGNOSIS — E876 Hypokalemia: Secondary | ICD-10-CM | POA: Diagnosis not present

## 2020-07-28 DIAGNOSIS — J939 Pneumothorax, unspecified: Secondary | ICD-10-CM | POA: Diagnosis not present

## 2020-07-28 DIAGNOSIS — G250 Essential tremor: Secondary | ICD-10-CM | POA: Diagnosis not present

## 2020-07-28 DIAGNOSIS — M419 Scoliosis, unspecified: Secondary | ICD-10-CM | POA: Diagnosis not present

## 2020-07-28 DIAGNOSIS — J869 Pyothorax without fistula: Secondary | ICD-10-CM | POA: Diagnosis not present

## 2020-07-28 DIAGNOSIS — B954 Other streptococcus as the cause of diseases classified elsewhere: Secondary | ICD-10-CM | POA: Diagnosis not present

## 2020-07-28 DIAGNOSIS — J9811 Atelectasis: Secondary | ICD-10-CM | POA: Diagnosis not present

## 2020-07-28 DIAGNOSIS — G40909 Epilepsy, unspecified, not intractable, without status epilepticus: Secondary | ICD-10-CM | POA: Diagnosis not present

## 2020-07-28 DIAGNOSIS — I700 Atherosclerosis of aorta: Secondary | ICD-10-CM | POA: Diagnosis not present

## 2020-07-28 DIAGNOSIS — J90 Pleural effusion, not elsewhere classified: Secondary | ICD-10-CM | POA: Diagnosis not present

## 2020-07-28 DIAGNOSIS — J9601 Acute respiratory failure with hypoxia: Secondary | ICD-10-CM | POA: Diagnosis not present

## 2020-07-28 DIAGNOSIS — D75839 Thrombocytosis, unspecified: Secondary | ICD-10-CM | POA: Diagnosis not present

## 2020-07-28 DIAGNOSIS — E079 Disorder of thyroid, unspecified: Secondary | ICD-10-CM | POA: Diagnosis not present

## 2020-07-29 DIAGNOSIS — E063 Autoimmune thyroiditis: Secondary | ICD-10-CM | POA: Diagnosis not present

## 2020-07-29 DIAGNOSIS — G250 Essential tremor: Secondary | ICD-10-CM | POA: Diagnosis not present

## 2020-07-29 DIAGNOSIS — G40909 Epilepsy, unspecified, not intractable, without status epilepticus: Secondary | ICD-10-CM | POA: Diagnosis not present

## 2020-07-31 ENCOUNTER — Ambulatory Visit (INDEPENDENT_AMBULATORY_CARE_PROVIDER_SITE_OTHER): Payer: BC Managed Care – PPO | Admitting: Infectious Diseases

## 2020-07-31 ENCOUNTER — Other Ambulatory Visit: Payer: Self-pay

## 2020-07-31 VITALS — HR 103 | Temp 98.2°F | Wt 115.0 lb

## 2020-07-31 DIAGNOSIS — R918 Other nonspecific abnormal finding of lung field: Secondary | ICD-10-CM | POA: Diagnosis not present

## 2020-07-31 DIAGNOSIS — Z5181 Encounter for therapeutic drug level monitoring: Secondary | ICD-10-CM | POA: Diagnosis not present

## 2020-07-31 DIAGNOSIS — J869 Pyothorax without fistula: Secondary | ICD-10-CM | POA: Diagnosis not present

## 2020-07-31 DIAGNOSIS — Z20822 Contact with and (suspected) exposure to covid-19: Secondary | ICD-10-CM | POA: Diagnosis not present

## 2020-07-31 NOTE — Progress Notes (Signed)
[facility]                                                             [address], [place], [place], [address]                                                                  Phn. [phone]; Fax: [phone]                                                                             Date: 07/31/20  Reason for Referral: HFU for Empyema   Assessment Left lung PNA/empyema 2/2 strep intermedius. TTE negative for vegetations Left hydropneumothorax CT chest 7/7 with increasing size of left loculated hydropneumothorax   Plan Continue Ceftriaxone and Metronidazole as is until 08/10/20 She is going for operative intervention next week at [facility] Will coordinate with [facility] for removal of PICC line at a later date Will request [facility] for labs She will call us back to our office once her surgery is done for follow up.   All questions and concerns were discussed and addressed. Patient verbalized understanding of the plan. ____________________________________________________________________________________________________________________ HPI/Interval events Patient is here for HFU for Left empyema. Patient was discharged from the hospital on 07/15/20 with PICC line and IV antibiotics. After hospital discharge, she followed up with [facility] and was seen by Dr Oneida Alar on 6/28. She had a CT Chest done on 07/23/20 which showed findings as below. She is planned to undergo operative intervention early next week.   She is getting IV antibiotics through her PICC line without any issues. Denies any pain/tenderness/swelling at the PICC line site. Denies any nausea, vomiting and abdominal pain. Has 2-3 episodes of loose stool. Occasional cough w associated minimal phlegm and chest discomfort. She is able to do her daily activities with minimal discomfort. She tells me she doesnot feel 100%. I discussed with her for now will plan  to continue IV antibiotics as is until 7/25 as previously planned. She will call our office once her surgery is done and make a follow up with [place] as she would like to continue follow her in case she will need to be on antibiotics post surgery.   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   Myopia    Presbyopia    Thyroid disease    Tremors of nervous system    essential   Past Surgical History:  Procedure Laterality Date   CESAREAN SECTION  1/93,1/95   LOBECTOMY  06/85   right temporal   TONSILLECTOMY     WISDOM TOOTH EXTRACTION     Allergies  Allergen  Reactions   Eggs Or Egg-Derived Products     Pt    Sulfa Antibiotics    Wheat Bran    Social History   Socioeconomic History   Marital status: Married    Spouse name: Not on file   Number of children: Not on file   Years of education: Not on file   Highest education level: Not on file  Occupational History   Not on file  Tobacco Use   Smoking status: Never   Smokeless tobacco: Never  Substance and Sexual Activity   Alcohol use: No   Drug use: No   Sexual activity: Yes    Birth control/protection: Post-menopausal  Other Topics Concern   Not on file  Social History Narrative   Not on file   Social Determinants of Health   Financial Resource Strain: Not on file  Food Insecurity: Not on file  Transportation Needs: Not on file  Physical Activity: Not on file  Stress: Not on file  Social Connections: Not on file  Intimate Partner Violence: Not on file     Vitals Pulse (!) 103   Temp 98.2 F (36.8 C) (Oral)   Wt 115 lb (52.2 kg)   SpO2 97%   BMI 21.03 kg/m    Examination  General - not in acute distress, comfortably sitting in chair HEENT - no pallor and no icterus Chest - decreased air entry in the left side, has some crackles  CVS- Normal s1s2, RRR Abdomen - Soft, Non tender , non  distended Ext- no pedal edema, PICC line site OK Neuro: grossly normal Back - WNL Psych : calm and cooperative   Recent labs CBC Latest Ref Rng & Units 07/10/2020 07/07/2020 07/06/2020  WBC 4.0 - 10.5 K/uL 10.8(H) 9.5 13.1(H)  Hemoglobin 12.0 - 15.0 g/dL 8.5(Y) 8.5(O) 2.7(X)  Hematocrit 36.0 - 46.0 % 27.9(L) 25.7(L) 29.6(L)  Platelets 150 - 400 K/uL 531(H) 511(H) 722(H)   CMP Latest Ref Rng & Units 07/15/2020 07/15/2020 07/14/2020  Glucose 70 - 99 mg/dL - 412(I) -  BUN 8 - 23 mg/dL - 7(L) -  Creatinine 7.86 - 1.00 mg/dL - 7.67 2.09  Sodium 470 - 145 mmol/L - 141 -  Potassium 3.5 - 5.1 mmol/L 4.2 2.7(LL) -  Chloride 98 - 111 mmol/L - 104 -  CO2 22 - 32 mmol/L - 26 -  Calcium 8.9 - 10.3 mg/dL - 9.0 -  Total Protein 6.5 - 8.1 g/dL - - -  Total Bilirubin 0.3 - 1.2 mg/dL - - -  Alkaline Phos 38 - 126 U/L - - -  AST 15 - 41 U/L - - -  ALT 0 - 44 U/L - - -     Pertinent Microbiology Results for orders placed or performed during the hospital encounter of 06/30/20  Resp Panel by RT-PCR (Flu A&B, Covid) Nasopharyngeal Swab     Status: None   Collection Time: 06/30/20  6:42 AM   Specimen: Nasopharyngeal Swab; Nasopharyngeal(NP) swabs in vial transport medium  Result Value Ref Range Status   SARS Coronavirus 2 by RT PCR NEGATIVE NEGATIVE Final    Comment: (NOTE) SARS-CoV-2 target nucleic acids are NOT DETECTED.  The SARS-CoV-2 RNA is generally detectable in upper respiratory specimens during the acute phase of infection. The lowest concentration of SARS-CoV-2 viral copies this assay can detect is 138 copies/mL. A negative result does not preclude SARS-Cov-2 infection and should not be used as the sole basis for treatment or other patient management decisions. A  negative result may occur with  improper specimen collection/handling, submission of specimen other than nasopharyngeal swab, presence of viral mutation(s) within the areas targeted by this assay, and inadequate number of  viral copies(<138 copies/mL). A negative result must be combined with clinical observations, patient history, and epidemiological information. The expected result is Negative.  Fact Sheet for Patients:  BloggerCourse.com  Fact Sheet for Healthcare Providers:  SeriousBroker.it  This test is no t yet approved or cleared by the [place] FDA and  has been authorized for detection and/or diagnosis of SARS-CoV-2 by FDA under an Emergency Use Authorization (EUA). This EUA will remain  in effect (meaning this test can be used) for the duration of the COVID-19 declaration under Section 564(b)(1) of the Act, 21 U.S.C.section 360bbb-3(b)(1), unless the authorization is terminated  or revoked sooner.       Influenza A by PCR NEGATIVE NEGATIVE Final   Influenza B by PCR NEGATIVE NEGATIVE Final    Comment: (NOTE) The Xpert Xpress SARS-CoV-2/FLU/RSV plus assay is intended as an aid in the diagnosis of influenza from Nasopharyngeal swab specimens and should not be used as a sole basis for treatment. Nasal washings and aspirates are unacceptable for Xpert Xpress SARS-CoV-2/FLU/RSV testing.  Fact Sheet for Patients: BloggerCourse.com  Fact Sheet for Healthcare Providers: SeriousBroker.it  This test is not yet approved or cleared by the [place] FDA and has been authorized for detection and/or diagnosis of SARS-CoV-2 by FDA under an Emergency Use Authorization (EUA). This EUA will remain in effect (meaning this test can be used) for the duration of the COVID-19 declaration under Section 564(b)(1) of the Act, 21 U.S.C. section 360bbb-3(b)(1), unless the authorization is terminated or revoked.  Performed at [facility], [address] [place], [place] [address]   Blood culture (routine x 2)     Status: None   Collection Time: 06/30/20  7:37 AM   Specimen: Left  Antecubital; Blood  Result Value Ref Range Status   Specimen Description   Final    LEFT ANTECUBITAL Performed at [facility], [address], [place], [place] [address]    Special Requests   Final    Blood Culture adequate volume Performed at [facility], [address], [place], [place] [address]    Culture   Final    NO GROWTH 5 DAYS Performed at [facility], [address], [place], [place] [address]    Report Status 07/05/2020 FINAL  Final  Blood culture (routine x 2)     Status: None   Collection Time: 06/30/20  7:42 AM   Specimen: Right Antecubital; Blood  Result Value Ref Range Status   Specimen Description   Final    RIGHT ANTECUBITAL Performed at [facility], [address] [place], [place] [address]    Special Requests   Final    Blood Culture adequate volume Performed at [facility], [address] [place], [place] [address]    Culture   Final    NO GROWTH 5 DAYS Performed at [facility], [address], [place], [place] [address]    Report Status 07/05/2020 FINAL  Final  Body fluid culture w Gram Stain     Status: None   Collection Time: 06/30/20 10:01 AM   Specimen: Pleural Fluid  Result Value Ref Range Status   Specimen Description   Final    PLEURAL Performed at [facility], [address] [place], [place] [address]  Special Requests   Final    NONE Performed at [facility], [address], [place], [place] [address]    Gram Stain   Final    ABUNDANT WBC PRESENT,BOTH PMN AND MONONUCLEAR ABUNDANT GRAM POSITIVE COCCI Performed at [facility], [address] [place], [place] [address]    Culture ABUNDANT STREPTOCOCCUS INTERMEDIUS  Final   Report Status 07/02/2020 FINAL  Final   Organism ID, Bacteria STREPTOCOCCUS INTERMEDIUS  Final      Susceptibility   Streptococcus intermedius - MIC*    PENICILLIN  <=0.06 SENSITIVE Sensitive     CEFTRIAXONE 0.25 SENSITIVE Sensitive     ERYTHROMYCIN <=0.12 SENSITIVE Sensitive     LEVOFLOXACIN 0.5 SENSITIVE Sensitive     VANCOMYCIN 0.5 SENSITIVE Sensitive     * ABUNDANT STREPTOCOCCUS INTERMEDIUS  MRSA PCR Screening     Status: None   Collection Time: 06/30/20  4:17 PM   Specimen: Nasopharyngeal  Result Value Ref Range Status   MRSA by PCR NEGATIVE NEGATIVE Final    Comment:        The GeneXpert MRSA Assay (FDA approved for NASAL specimens only), is one component of a comprehensive MRSA colonization surveillance program. It is not intended to diagnose MRSA infection nor to guide or monitor treatment for MRSA infections. Performed at [facility], [address] [place], [place] [address]   Body fluid culture w Gram Stain     Status: None   Collection Time: 07/01/20 12:30 PM   Specimen: Pleura; Body Fluid  Result Value Ref Range Status   Specimen Description   Final    PLEURAL LT Performed at [facility], [address] [place], [place] [address]    Special Requests   Final    NONE Performed at [facility], [address] [place], [place] [address]    Gram Stain   Final    ABUNDANT WBC PRESENT,BOTH PMN AND MONONUCLEAR ABUNDANT GRAM POSITIVE COCCI    Culture   Final    ABUNDANT STREPTOCOCCUS INTERMEDIUS SUSCEPTIBILITIES PERFORMED ON PREVIOUS CULTURE WITHIN THE LAST 5 DAYS. Performed at [facility], [address] [place], [place] [address]    Report Status 07/03/2020 FINAL  Final  Aerobic/Anaerobic Culture w Gram Stain (surgical/deep wound)     Status: None   Collection Time: 07/03/20  4:14 PM   Specimen: Abscess  Result Value Ref Range Status   Specimen Description   Final    ABSCESS Performed at [facility], [address] [place], [place] [address]    Special Requests   Final    Normal Performed at [facility], [address] [place], [place] [address]    Gram Stain   Final    ABUNDANT WBC PRESENT,BOTH PMN AND MONONUCLEAR MODERATE GRAM POSITIVE COCCI    Culture   Final    No growth aerobically or anaerobically. Performed at [facility], [address] [place], [place] [address]    Report Status 07/08/2020 FINAL  Final    Pertinent Imaging All pertinent labs/Imagings/notes reviewed. All pertinent plain films and CT images have been personally visualized and interpreted; radiology reports have been reviewed. Decision making incorporated into the Impression / Recommendations.  Impression   Increased consolidative opacities in the left upper lobe, lingula, and lower lobe with similar to slightly increased occlusion of multiple distal airways, likely representing atelectasis.   Interval increase in size of left loculated  hydropneumothorax with loculated fluid along the right major fissure as well.Recommend follow-up CT of the chest in 3 months to assess for resolution.    Multiple prominent subcentimeter mediastinal lymph nodes, likely reactive.   Unchanged 0.5 cm right upper lobe nodule, indeterminate.   I have spent a total of 60 minutes of face-to-face and non-face-to-face time, excluding clinical staff time, preparing to see patient, ordering tests and/or medications, and provide counseling the patient    Electronically signed by:  Odette Fraction, MD Infectious Disease Physician [facility] [address] [place], [place] [address] Phone: [phone]  Fax: [phone]

## 2020-08-01 DIAGNOSIS — Z5181 Encounter for therapeutic drug level monitoring: Secondary | ICD-10-CM | POA: Insufficient documentation

## 2020-08-03 DIAGNOSIS — R091 Pleurisy: Secondary | ICD-10-CM | POA: Diagnosis not present

## 2020-08-03 DIAGNOSIS — E876 Hypokalemia: Secondary | ICD-10-CM | POA: Diagnosis not present

## 2020-08-03 DIAGNOSIS — G250 Essential tremor: Secondary | ICD-10-CM | POA: Diagnosis not present

## 2020-08-03 DIAGNOSIS — J929 Pleural plaque without asbestos: Secondary | ICD-10-CM | POA: Diagnosis not present

## 2020-08-03 DIAGNOSIS — G8912 Acute post-thoracotomy pain: Secondary | ICD-10-CM | POA: Diagnosis not present

## 2020-08-03 DIAGNOSIS — I700 Atherosclerosis of aorta: Secondary | ICD-10-CM | POA: Diagnosis not present

## 2020-08-03 DIAGNOSIS — J9809 Other diseases of bronchus, not elsewhere classified: Secondary | ICD-10-CM | POA: Diagnosis not present

## 2020-08-03 DIAGNOSIS — Z792 Long term (current) use of antibiotics: Secondary | ICD-10-CM | POA: Diagnosis not present

## 2020-08-03 DIAGNOSIS — R845 Abnormal microbiological findings in specimens from respiratory organs and thorax: Secondary | ICD-10-CM | POA: Diagnosis not present

## 2020-08-03 DIAGNOSIS — B954 Other streptococcus as the cause of diseases classified elsewhere: Secondary | ICD-10-CM | POA: Diagnosis not present

## 2020-08-03 DIAGNOSIS — J942 Hemothorax: Secondary | ICD-10-CM | POA: Diagnosis not present

## 2020-08-03 DIAGNOSIS — D75839 Thrombocytosis, unspecified: Secondary | ICD-10-CM | POA: Diagnosis not present

## 2020-08-03 DIAGNOSIS — I952 Hypotension due to drugs: Secondary | ICD-10-CM | POA: Diagnosis not present

## 2020-08-03 DIAGNOSIS — B957 Other staphylococcus as the cause of diseases classified elsewhere: Secondary | ICD-10-CM | POA: Diagnosis not present

## 2020-08-03 DIAGNOSIS — D62 Acute posthemorrhagic anemia: Secondary | ICD-10-CM | POA: Diagnosis not present

## 2020-08-03 DIAGNOSIS — E440 Moderate protein-calorie malnutrition: Secondary | ICD-10-CM | POA: Diagnosis not present

## 2020-08-03 DIAGNOSIS — B9689 Other specified bacterial agents as the cause of diseases classified elsewhere: Secondary | ICD-10-CM | POA: Diagnosis not present

## 2020-08-03 DIAGNOSIS — R918 Other nonspecific abnormal finding of lung field: Secondary | ICD-10-CM | POA: Diagnosis not present

## 2020-08-03 DIAGNOSIS — G40909 Epilepsy, unspecified, not intractable, without status epilepticus: Secondary | ICD-10-CM | POA: Diagnosis not present

## 2020-08-03 DIAGNOSIS — Z6820 Body mass index (BMI) 20.0-20.9, adult: Secondary | ICD-10-CM | POA: Diagnosis not present

## 2020-08-03 DIAGNOSIS — R0789 Other chest pain: Secondary | ICD-10-CM | POA: Diagnosis not present

## 2020-08-03 DIAGNOSIS — D72829 Elevated white blood cell count, unspecified: Secondary | ICD-10-CM | POA: Diagnosis not present

## 2020-08-03 DIAGNOSIS — G8918 Other acute postprocedural pain: Secondary | ICD-10-CM | POA: Diagnosis not present

## 2020-08-03 DIAGNOSIS — J948 Other specified pleural conditions: Secondary | ICD-10-CM | POA: Diagnosis not present

## 2020-08-03 DIAGNOSIS — I4949 Other premature depolarization: Secondary | ICD-10-CM | POA: Diagnosis not present

## 2020-08-03 DIAGNOSIS — E039 Hypothyroidism, unspecified: Secondary | ICD-10-CM | POA: Diagnosis not present

## 2020-08-03 DIAGNOSIS — J869 Pyothorax without fistula: Secondary | ICD-10-CM | POA: Diagnosis not present

## 2020-08-03 DIAGNOSIS — Z28310 Unvaccinated for covid-19: Secondary | ICD-10-CM | POA: Diagnosis not present

## 2020-08-03 DIAGNOSIS — J984 Other disorders of lung: Secondary | ICD-10-CM | POA: Diagnosis not present

## 2020-08-03 DIAGNOSIS — Z4682 Encounter for fitting and adjustment of non-vascular catheter: Secondary | ICD-10-CM | POA: Diagnosis not present

## 2020-08-03 DIAGNOSIS — Z882 Allergy status to sulfonamides status: Secondary | ICD-10-CM | POA: Diagnosis not present

## 2020-08-03 DIAGNOSIS — T426X5A Adverse effect of other antiepileptic and sedative-hypnotic drugs, initial encounter: Secondary | ICD-10-CM | POA: Diagnosis not present

## 2020-08-03 DIAGNOSIS — J9601 Acute respiratory failure with hypoxia: Secondary | ICD-10-CM | POA: Diagnosis not present

## 2020-08-03 DIAGNOSIS — M419 Scoliosis, unspecified: Secondary | ICD-10-CM | POA: Diagnosis not present

## 2020-08-03 DIAGNOSIS — Z9889 Other specified postprocedural states: Secondary | ICD-10-CM | POA: Diagnosis not present

## 2020-08-03 DIAGNOSIS — Z9911 Dependence on respirator [ventilator] status: Secondary | ICD-10-CM | POA: Diagnosis not present

## 2020-08-03 DIAGNOSIS — E079 Disorder of thyroid, unspecified: Secondary | ICD-10-CM | POA: Diagnosis not present

## 2020-08-03 DIAGNOSIS — J939 Pneumothorax, unspecified: Secondary | ICD-10-CM | POA: Diagnosis not present

## 2020-08-03 DIAGNOSIS — D649 Anemia, unspecified: Secondary | ICD-10-CM | POA: Diagnosis not present

## 2020-08-03 DIAGNOSIS — J9811 Atelectasis: Secondary | ICD-10-CM | POA: Diagnosis not present

## 2020-08-03 DIAGNOSIS — Z79899 Other long term (current) drug therapy: Secondary | ICD-10-CM | POA: Diagnosis not present

## 2020-08-03 DIAGNOSIS — Z5332 Thoracoscopic surgical procedure converted to open procedure: Secondary | ICD-10-CM | POA: Diagnosis not present

## 2020-08-03 DIAGNOSIS — E872 Acidosis: Secondary | ICD-10-CM | POA: Diagnosis not present

## 2020-08-03 DIAGNOSIS — D72828 Other elevated white blood cell count: Secondary | ICD-10-CM | POA: Diagnosis not present

## 2020-08-03 DIAGNOSIS — J90 Pleural effusion, not elsewhere classified: Secondary | ICD-10-CM | POA: Diagnosis not present

## 2020-08-03 DIAGNOSIS — E063 Autoimmune thyroiditis: Secondary | ICD-10-CM | POA: Diagnosis not present

## 2020-08-05 ENCOUNTER — Inpatient Hospital Stay: Payer: BC Managed Care – PPO | Admitting: Acute Care

## 2020-08-17 DIAGNOSIS — I700 Atherosclerosis of aorta: Secondary | ICD-10-CM | POA: Diagnosis not present

## 2020-08-17 DIAGNOSIS — E876 Hypokalemia: Secondary | ICD-10-CM | POA: Diagnosis not present

## 2020-08-17 DIAGNOSIS — J9811 Atelectasis: Secondary | ICD-10-CM | POA: Diagnosis not present

## 2020-08-17 DIAGNOSIS — B954 Other streptococcus as the cause of diseases classified elsewhere: Secondary | ICD-10-CM | POA: Diagnosis not present

## 2020-08-17 DIAGNOSIS — M419 Scoliosis, unspecified: Secondary | ICD-10-CM | POA: Diagnosis not present

## 2020-08-17 DIAGNOSIS — J90 Pleural effusion, not elsewhere classified: Secondary | ICD-10-CM | POA: Diagnosis not present

## 2020-08-17 DIAGNOSIS — D75839 Thrombocytosis, unspecified: Secondary | ICD-10-CM | POA: Diagnosis not present

## 2020-08-17 DIAGNOSIS — G250 Essential tremor: Secondary | ICD-10-CM | POA: Diagnosis not present

## 2020-08-17 DIAGNOSIS — J939 Pneumothorax, unspecified: Secondary | ICD-10-CM | POA: Diagnosis not present

## 2020-08-17 DIAGNOSIS — G40909 Epilepsy, unspecified, not intractable, without status epilepticus: Secondary | ICD-10-CM | POA: Diagnosis not present

## 2020-08-17 DIAGNOSIS — E079 Disorder of thyroid, unspecified: Secondary | ICD-10-CM | POA: Diagnosis not present

## 2020-08-17 DIAGNOSIS — J9601 Acute respiratory failure with hypoxia: Secondary | ICD-10-CM | POA: Diagnosis not present

## 2020-08-17 DIAGNOSIS — J869 Pyothorax without fistula: Secondary | ICD-10-CM | POA: Diagnosis not present

## 2020-08-17 DIAGNOSIS — D72829 Elevated white blood cell count, unspecified: Secondary | ICD-10-CM | POA: Diagnosis not present

## 2020-08-17 DIAGNOSIS — D649 Anemia, unspecified: Secondary | ICD-10-CM | POA: Diagnosis not present

## 2020-08-17 DIAGNOSIS — E440 Moderate protein-calorie malnutrition: Secondary | ICD-10-CM | POA: Diagnosis not present

## 2020-08-18 DIAGNOSIS — J869 Pyothorax without fistula: Secondary | ICD-10-CM | POA: Diagnosis not present

## 2020-08-19 ENCOUNTER — Ambulatory Visit: Payer: BC Managed Care – PPO | Admitting: Emergency Medicine

## 2020-08-19 DIAGNOSIS — J869 Pyothorax without fistula: Secondary | ICD-10-CM | POA: Diagnosis not present

## 2020-08-20 DIAGNOSIS — J869 Pyothorax without fistula: Secondary | ICD-10-CM | POA: Diagnosis not present

## 2020-08-21 DIAGNOSIS — J869 Pyothorax without fistula: Secondary | ICD-10-CM | POA: Diagnosis not present

## 2020-08-22 DIAGNOSIS — J869 Pyothorax without fistula: Secondary | ICD-10-CM | POA: Diagnosis not present

## 2020-08-23 DIAGNOSIS — J869 Pyothorax without fistula: Secondary | ICD-10-CM | POA: Diagnosis not present

## 2020-08-24 DIAGNOSIS — E440 Moderate protein-calorie malnutrition: Secondary | ICD-10-CM | POA: Diagnosis not present

## 2020-08-24 DIAGNOSIS — J9811 Atelectasis: Secondary | ICD-10-CM | POA: Diagnosis not present

## 2020-08-24 DIAGNOSIS — D649 Anemia, unspecified: Secondary | ICD-10-CM | POA: Diagnosis not present

## 2020-08-24 DIAGNOSIS — G250 Essential tremor: Secondary | ICD-10-CM | POA: Diagnosis not present

## 2020-08-24 DIAGNOSIS — I700 Atherosclerosis of aorta: Secondary | ICD-10-CM | POA: Diagnosis not present

## 2020-08-24 DIAGNOSIS — G40909 Epilepsy, unspecified, not intractable, without status epilepticus: Secondary | ICD-10-CM | POA: Diagnosis not present

## 2020-08-24 DIAGNOSIS — J9601 Acute respiratory failure with hypoxia: Secondary | ICD-10-CM | POA: Diagnosis not present

## 2020-08-24 DIAGNOSIS — D72829 Elevated white blood cell count, unspecified: Secondary | ICD-10-CM | POA: Diagnosis not present

## 2020-08-24 DIAGNOSIS — J90 Pleural effusion, not elsewhere classified: Secondary | ICD-10-CM | POA: Diagnosis not present

## 2020-08-24 DIAGNOSIS — D75839 Thrombocytosis, unspecified: Secondary | ICD-10-CM | POA: Diagnosis not present

## 2020-08-24 DIAGNOSIS — B954 Other streptococcus as the cause of diseases classified elsewhere: Secondary | ICD-10-CM | POA: Diagnosis not present

## 2020-08-24 DIAGNOSIS — J869 Pyothorax without fistula: Secondary | ICD-10-CM | POA: Diagnosis not present

## 2020-08-24 DIAGNOSIS — J939 Pneumothorax, unspecified: Secondary | ICD-10-CM | POA: Diagnosis not present

## 2020-08-24 DIAGNOSIS — E079 Disorder of thyroid, unspecified: Secondary | ICD-10-CM | POA: Diagnosis not present

## 2020-08-24 DIAGNOSIS — Z792 Long term (current) use of antibiotics: Secondary | ICD-10-CM | POA: Diagnosis not present

## 2020-08-24 DIAGNOSIS — E876 Hypokalemia: Secondary | ICD-10-CM | POA: Diagnosis not present

## 2020-08-24 DIAGNOSIS — M419 Scoliosis, unspecified: Secondary | ICD-10-CM | POA: Diagnosis not present

## 2020-08-25 DIAGNOSIS — Z9889 Other specified postprocedural states: Secondary | ICD-10-CM | POA: Diagnosis not present

## 2020-08-25 DIAGNOSIS — J90 Pleural effusion, not elsewhere classified: Secondary | ICD-10-CM | POA: Diagnosis not present

## 2020-08-25 DIAGNOSIS — J869 Pyothorax without fistula: Secondary | ICD-10-CM | POA: Diagnosis not present

## 2020-08-25 DIAGNOSIS — J984 Other disorders of lung: Secondary | ICD-10-CM | POA: Diagnosis not present

## 2020-08-26 DIAGNOSIS — J869 Pyothorax without fistula: Secondary | ICD-10-CM | POA: Diagnosis not present

## 2020-08-27 DIAGNOSIS — J869 Pyothorax without fistula: Secondary | ICD-10-CM | POA: Diagnosis not present

## 2020-08-29 DIAGNOSIS — J869 Pyothorax without fistula: Secondary | ICD-10-CM | POA: Diagnosis not present

## 2020-08-30 DIAGNOSIS — J869 Pyothorax without fistula: Secondary | ICD-10-CM | POA: Diagnosis not present

## 2020-08-31 DIAGNOSIS — G40909 Epilepsy, unspecified, not intractable, without status epilepticus: Secondary | ICD-10-CM | POA: Diagnosis not present

## 2020-08-31 DIAGNOSIS — E079 Disorder of thyroid, unspecified: Secondary | ICD-10-CM | POA: Diagnosis not present

## 2020-08-31 DIAGNOSIS — J9601 Acute respiratory failure with hypoxia: Secondary | ICD-10-CM | POA: Diagnosis not present

## 2020-08-31 DIAGNOSIS — I700 Atherosclerosis of aorta: Secondary | ICD-10-CM | POA: Diagnosis not present

## 2020-08-31 DIAGNOSIS — G250 Essential tremor: Secondary | ICD-10-CM | POA: Diagnosis not present

## 2020-08-31 DIAGNOSIS — J869 Pyothorax without fistula: Secondary | ICD-10-CM | POA: Diagnosis not present

## 2020-08-31 DIAGNOSIS — J90 Pleural effusion, not elsewhere classified: Secondary | ICD-10-CM | POA: Diagnosis not present

## 2020-08-31 DIAGNOSIS — M419 Scoliosis, unspecified: Secondary | ICD-10-CM | POA: Diagnosis not present

## 2020-08-31 DIAGNOSIS — D75839 Thrombocytosis, unspecified: Secondary | ICD-10-CM | POA: Diagnosis not present

## 2020-08-31 DIAGNOSIS — D72829 Elevated white blood cell count, unspecified: Secondary | ICD-10-CM | POA: Diagnosis not present

## 2020-08-31 DIAGNOSIS — D649 Anemia, unspecified: Secondary | ICD-10-CM | POA: Diagnosis not present

## 2020-08-31 DIAGNOSIS — E876 Hypokalemia: Secondary | ICD-10-CM | POA: Diagnosis not present

## 2020-08-31 DIAGNOSIS — J9811 Atelectasis: Secondary | ICD-10-CM | POA: Diagnosis not present

## 2020-08-31 DIAGNOSIS — J939 Pneumothorax, unspecified: Secondary | ICD-10-CM | POA: Diagnosis not present

## 2020-08-31 DIAGNOSIS — E440 Moderate protein-calorie malnutrition: Secondary | ICD-10-CM | POA: Diagnosis not present

## 2020-08-31 DIAGNOSIS — Z792 Long term (current) use of antibiotics: Secondary | ICD-10-CM | POA: Diagnosis not present

## 2020-08-31 DIAGNOSIS — B954 Other streptococcus as the cause of diseases classified elsewhere: Secondary | ICD-10-CM | POA: Diagnosis not present

## 2020-09-01 DIAGNOSIS — J869 Pyothorax without fistula: Secondary | ICD-10-CM | POA: Diagnosis not present

## 2020-09-02 DIAGNOSIS — J869 Pyothorax without fistula: Secondary | ICD-10-CM | POA: Diagnosis not present

## 2020-09-03 DIAGNOSIS — J869 Pyothorax without fistula: Secondary | ICD-10-CM | POA: Diagnosis not present

## 2020-09-05 DIAGNOSIS — J869 Pyothorax without fistula: Secondary | ICD-10-CM | POA: Diagnosis not present

## 2020-09-06 DIAGNOSIS — J869 Pyothorax without fistula: Secondary | ICD-10-CM | POA: Diagnosis not present

## 2020-09-07 DIAGNOSIS — J869 Pyothorax without fistula: Secondary | ICD-10-CM | POA: Diagnosis not present

## 2020-09-08 DIAGNOSIS — J9601 Acute respiratory failure with hypoxia: Secondary | ICD-10-CM | POA: Diagnosis not present

## 2020-09-08 DIAGNOSIS — G250 Essential tremor: Secondary | ICD-10-CM | POA: Diagnosis not present

## 2020-09-08 DIAGNOSIS — D72829 Elevated white blood cell count, unspecified: Secondary | ICD-10-CM | POA: Diagnosis not present

## 2020-09-08 DIAGNOSIS — I700 Atherosclerosis of aorta: Secondary | ICD-10-CM | POA: Diagnosis not present

## 2020-09-08 DIAGNOSIS — J90 Pleural effusion, not elsewhere classified: Secondary | ICD-10-CM | POA: Diagnosis not present

## 2020-09-08 DIAGNOSIS — J9811 Atelectasis: Secondary | ICD-10-CM | POA: Diagnosis not present

## 2020-09-08 DIAGNOSIS — D75839 Thrombocytosis, unspecified: Secondary | ICD-10-CM | POA: Diagnosis not present

## 2020-09-08 DIAGNOSIS — B954 Other streptococcus as the cause of diseases classified elsewhere: Secondary | ICD-10-CM | POA: Diagnosis not present

## 2020-09-08 DIAGNOSIS — D649 Anemia, unspecified: Secondary | ICD-10-CM | POA: Diagnosis not present

## 2020-09-08 DIAGNOSIS — E876 Hypokalemia: Secondary | ICD-10-CM | POA: Diagnosis not present

## 2020-09-08 DIAGNOSIS — J869 Pyothorax without fistula: Secondary | ICD-10-CM | POA: Diagnosis not present

## 2020-09-08 DIAGNOSIS — E079 Disorder of thyroid, unspecified: Secondary | ICD-10-CM | POA: Diagnosis not present

## 2020-09-08 DIAGNOSIS — E440 Moderate protein-calorie malnutrition: Secondary | ICD-10-CM | POA: Diagnosis not present

## 2020-09-08 DIAGNOSIS — G40909 Epilepsy, unspecified, not intractable, without status epilepticus: Secondary | ICD-10-CM | POA: Diagnosis not present

## 2020-09-08 DIAGNOSIS — M419 Scoliosis, unspecified: Secondary | ICD-10-CM | POA: Diagnosis not present

## 2020-09-08 DIAGNOSIS — J939 Pneumothorax, unspecified: Secondary | ICD-10-CM | POA: Diagnosis not present

## 2020-09-09 DIAGNOSIS — J929 Pleural plaque without asbestos: Secondary | ICD-10-CM | POA: Diagnosis not present

## 2020-09-09 DIAGNOSIS — J869 Pyothorax without fistula: Secondary | ICD-10-CM | POA: Diagnosis not present

## 2020-09-10 DIAGNOSIS — Z792 Long term (current) use of antibiotics: Secondary | ICD-10-CM | POA: Diagnosis not present

## 2020-09-10 DIAGNOSIS — J869 Pyothorax without fistula: Secondary | ICD-10-CM | POA: Diagnosis not present

## 2020-09-10 DIAGNOSIS — Z6820 Body mass index (BMI) 20.0-20.9, adult: Secondary | ICD-10-CM | POA: Diagnosis not present

## 2021-12-31 IMAGING — CT CT GUIDANCE NEEDLE PLACEMENT
2 of 6 series · 15 of 30 positions shown, 18 images · non-contrast
Comparison: none

INDICATION: 65-year-old female with abnormal soft tissue density in the lung
left anterior inferior pleural space concerning for pleural based
tumor versus extension of empyema. She presents for CT-guided
aspiration versus biopsy.

[Series 2: i-spiral 5.0 br40 · axial · 0.98mm/px · z∈[+1363,+1503]mm · 12 of 50 slices shown, 15 images (1 of 2)]
[im 5/50  mediastinal]
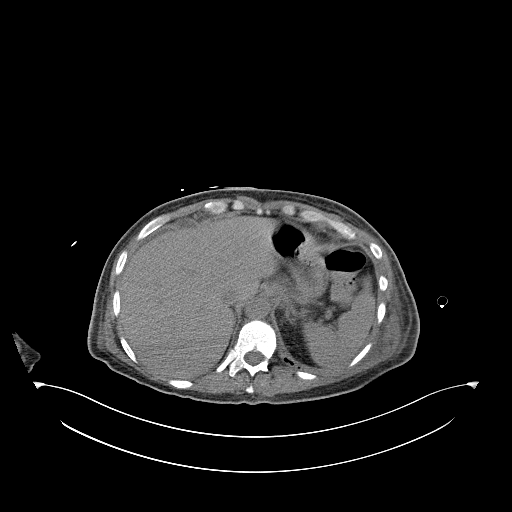
[im 5/50  lung]
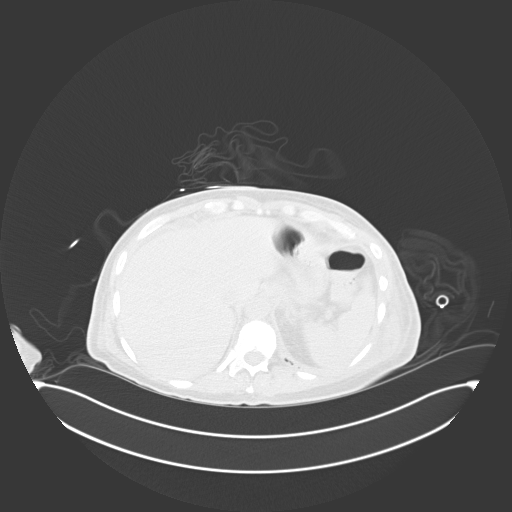
[im 9/50  lung]
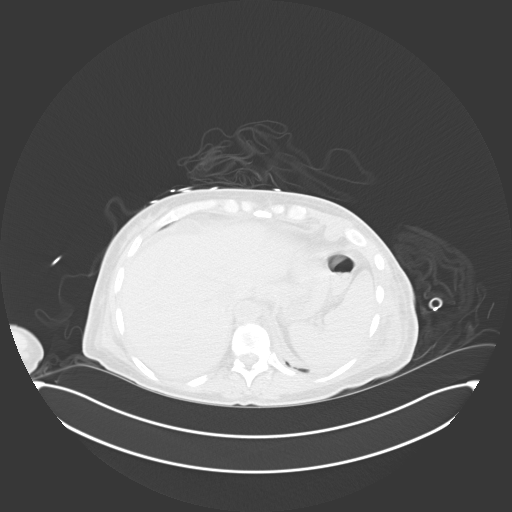
[im 13/50  lung]
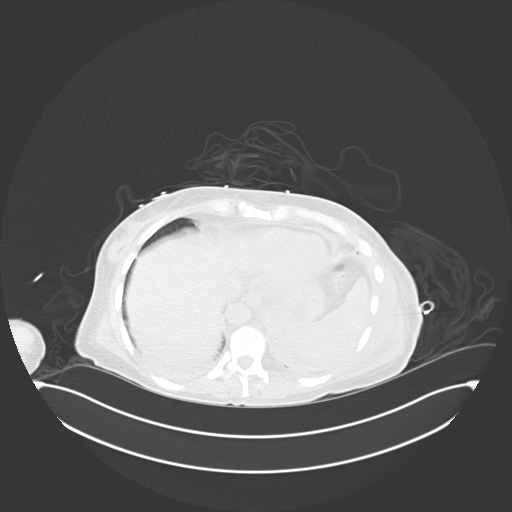
[im 14/50  lung]
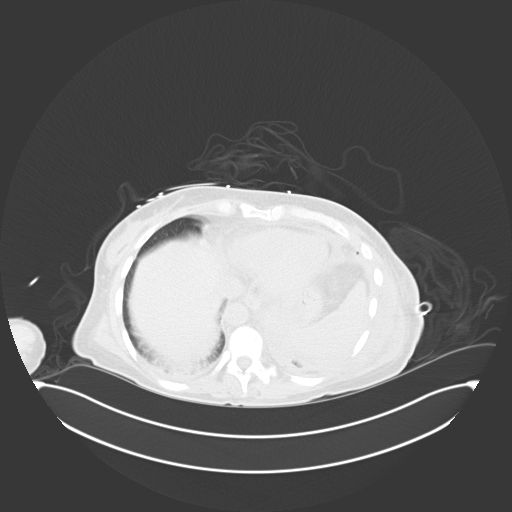
[im 17/50  mediastinal]
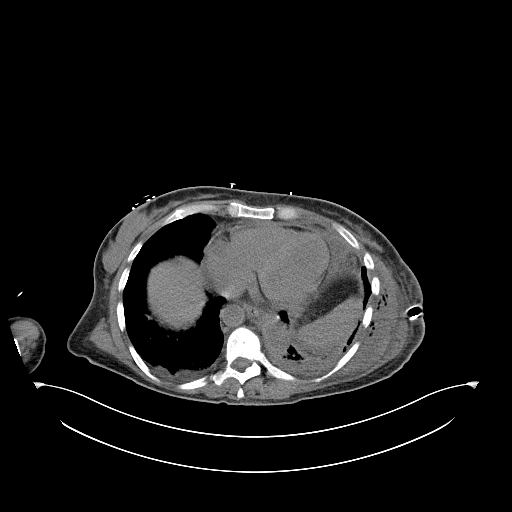
[im 17/50  lung]
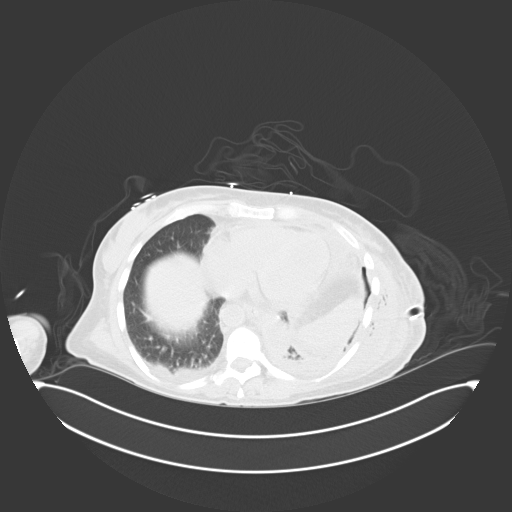
[im 21/50  lung]
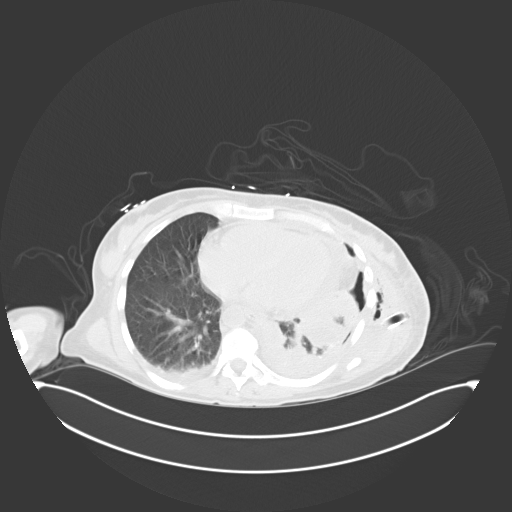
[im 25/50  lung]
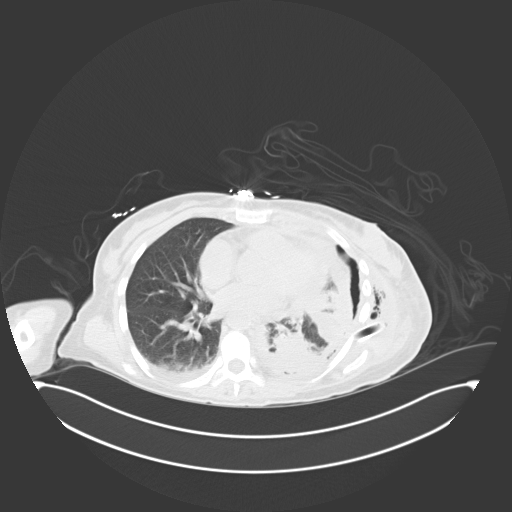
[im 29/50  lung]
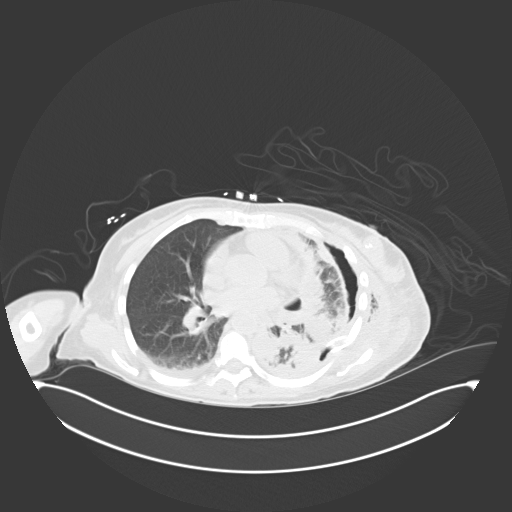
[im 33/50  mediastinal]
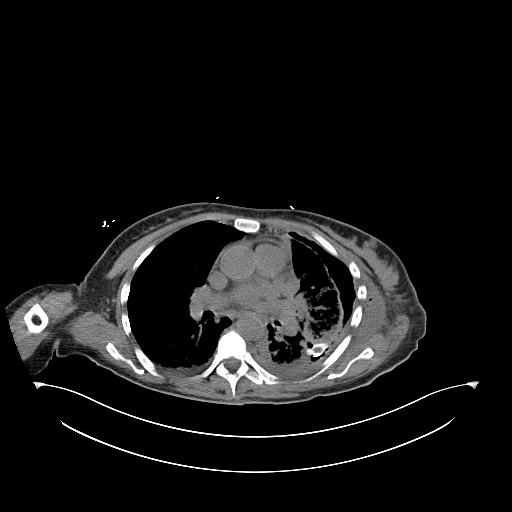
[im 33/50  lung]
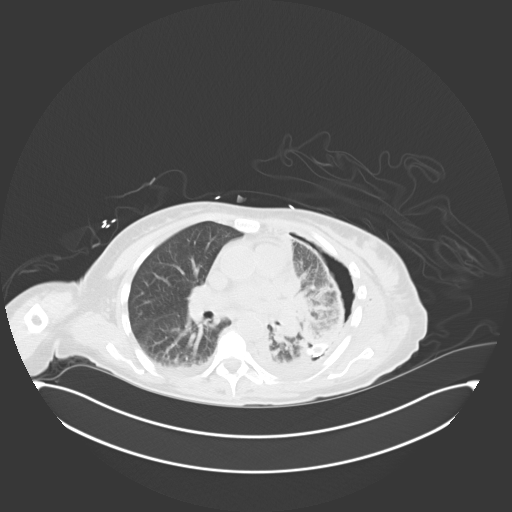
[im 37/50  lung]
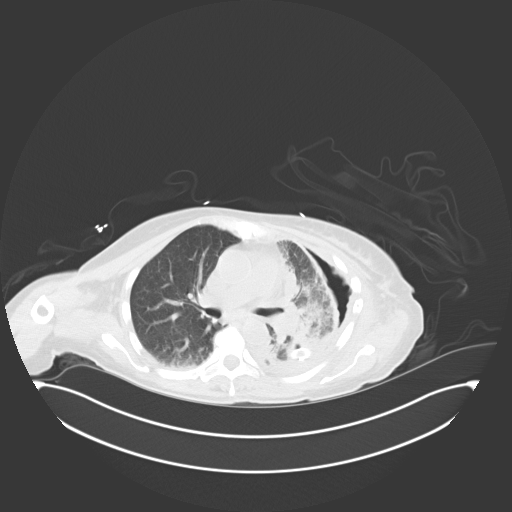
[im 41/50  lung]
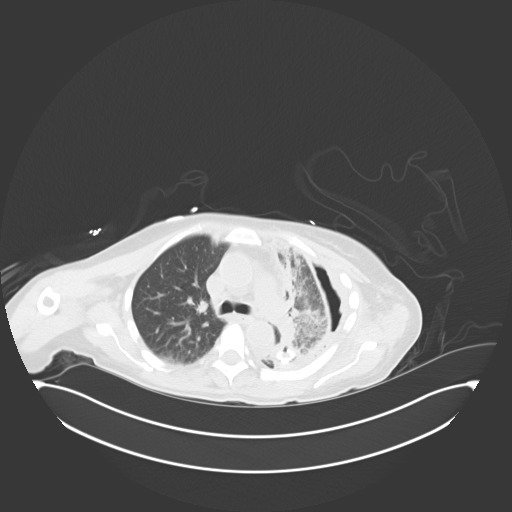
[im 45/50  lung]
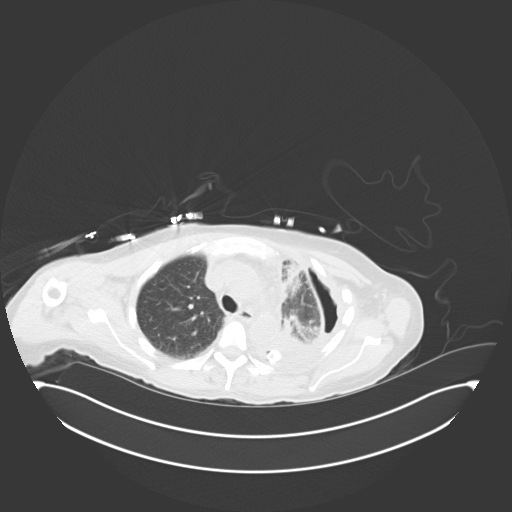

[Series 3: i-spiral 5.0 br40 · axial · 0.98mm/px · z∈[+1296,+1338]mm · 3 of 39 slices shown (2 of 2)]
[im 4/39  lung]
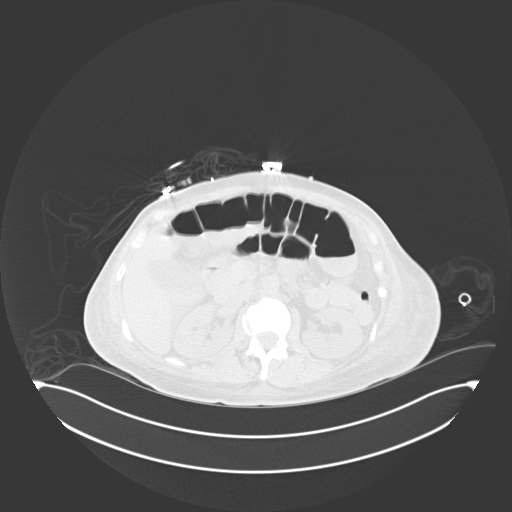
[im 8/39  lung]
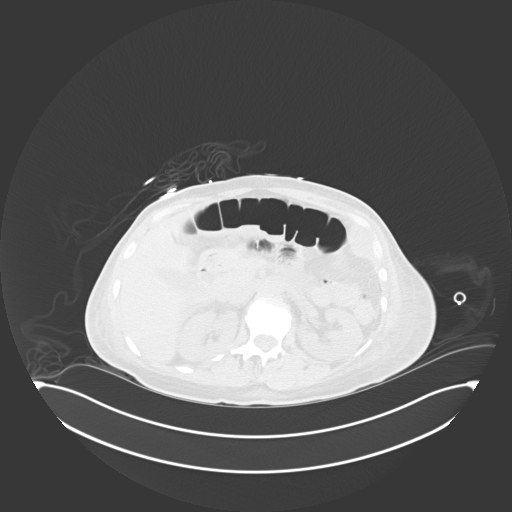
[im 16/39  lung]
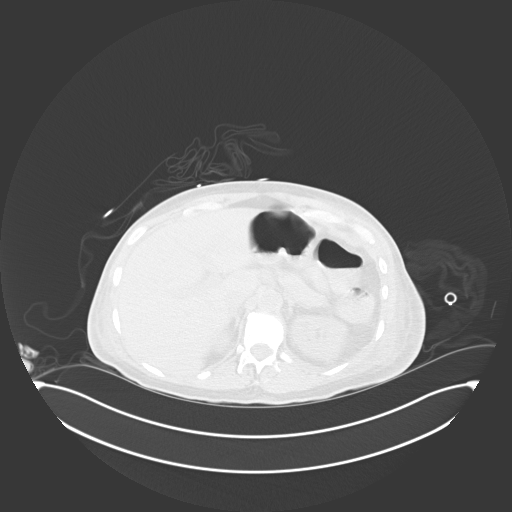

[15 of 30 positions shown; findings below may reference images not displayed]

EXAM:
CT-guided aspiration

MEDICATIONS:
The patient is currently admitted to the hospital and receiving
intravenous antibiotics. The antibiotics were administered within an
appropriate time frame prior to the initiation of the procedure.

ANESTHESIA/SEDATION:
Fentanyl 100 mcg IV; Versed 2 mg IV

Moderate Sedation Time:  10 minutes

The patient was continuously monitored during the procedure by the
interventional radiology nurse under my direct supervision.

COMPLICATIONS:
None immediate.

PROCEDURE:
Informed written consent was obtained from the patient after a
thorough discussion of the procedural risks, benefits and
alternatives. All questions were addressed. Maximal Sterile Barrier
Technique was utilized including caps, mask, sterile gowns, sterile
gloves, sterile drape, hand hygiene and skin antiseptic. A timeout
was performed prior to the initiation of the procedure.

A planning axial CT scan was performed. The fluid collection was
successfully identified. The overlying skin was sterilely prepped
and draped in the standard fashion using chlorhexidine. Local
anesthesia was attained by infiltration with 1% lidocaine. A small
dermatotomy was made. An 18 gauge trocar needle was advanced into
the structure. There was minimal tactile feedback indicating that
this represents fluid rather than solid soft tissue. Aspiration was
then performed yielding approximately 10 mL of thick purulent fluid.
Repeat imaging demonstrates near complete resolution of the
abnormality.
IMPRESSION: The CT abnormality corresponds with a pocket of complex purulent
fluid. 10 mL was successfully aspirated and sent for culture.

No definite soft tissue mass present for biopsy.
